# Patient Record
Sex: Male | Born: 1945 | Race: White | Hispanic: No | Marital: Single | Smoking: Never smoker
Health system: Southern US, Community
[De-identification: ages and names within clinical notes are randomized; demographics above are authoritative.]

## PROBLEM LIST (undated history)

## (undated) DIAGNOSIS — C801 Malignant (primary) neoplasm, unspecified: Secondary | ICD-10-CM

## (undated) DIAGNOSIS — E119 Type 2 diabetes mellitus without complications: Secondary | ICD-10-CM

## (undated) DIAGNOSIS — I2510 Atherosclerotic heart disease of native coronary artery without angina pectoris: Secondary | ICD-10-CM

## (undated) DIAGNOSIS — I10 Essential (primary) hypertension: Secondary | ICD-10-CM

## (undated) HISTORY — PX: CORONARY ARTERY BYPASS GRAFT: SHX141

## (undated) HISTORY — PX: SKIN CANCER EXCISION: SHX779

## (undated) HISTORY — PX: EYE SURGERY: SHX253

---

## 2008-01-04 DIAGNOSIS — J309 Allergic rhinitis, unspecified: Secondary | ICD-10-CM | POA: Insufficient documentation

## 2009-01-29 DIAGNOSIS — K219 Gastro-esophageal reflux disease without esophagitis: Secondary | ICD-10-CM | POA: Insufficient documentation

## 2009-02-19 DIAGNOSIS — M109 Gout, unspecified: Secondary | ICD-10-CM | POA: Insufficient documentation

## 2009-03-29 DIAGNOSIS — I10 Essential (primary) hypertension: Secondary | ICD-10-CM | POA: Insufficient documentation

## 2010-12-18 DIAGNOSIS — E119 Type 2 diabetes mellitus without complications: Secondary | ICD-10-CM | POA: Insufficient documentation

## 2015-12-11 DIAGNOSIS — E1169 Type 2 diabetes mellitus with other specified complication: Secondary | ICD-10-CM | POA: Insufficient documentation

## 2015-12-11 DIAGNOSIS — E1121 Type 2 diabetes mellitus with diabetic nephropathy: Secondary | ICD-10-CM | POA: Insufficient documentation

## 2016-12-11 DIAGNOSIS — E790 Hyperuricemia without signs of inflammatory arthritis and tophaceous disease: Secondary | ICD-10-CM | POA: Insufficient documentation

## 2016-12-15 DIAGNOSIS — D510 Vitamin B12 deficiency anemia due to intrinsic factor deficiency: Secondary | ICD-10-CM | POA: Insufficient documentation

## 2017-03-12 DIAGNOSIS — D6949 Other primary thrombocytopenia: Secondary | ICD-10-CM | POA: Insufficient documentation

## 2017-06-17 ENCOUNTER — Other Ambulatory Visit: Payer: Self-pay | Admitting: Orthopedic Surgery

## 2017-06-17 DIAGNOSIS — M5412 Radiculopathy, cervical region: Secondary | ICD-10-CM

## 2017-06-20 ENCOUNTER — Ambulatory Visit
Admission: RE | Admit: 2017-06-20 | Discharge: 2017-06-20 | Disposition: A | Discharge status: Home / Self Care | Payer: Medicare Other | Source: Ambulatory Visit | Attending: Orthopedic Surgery | Admitting: Orthopedic Surgery

## 2017-06-20 DIAGNOSIS — M4802 Spinal stenosis, cervical region: Secondary | ICD-10-CM | POA: Insufficient documentation

## 2017-06-20 DIAGNOSIS — M50223 Other cervical disc displacement at C6-C7 level: Secondary | ICD-10-CM | POA: Diagnosis not present

## 2017-06-20 DIAGNOSIS — M2578 Osteophyte, vertebrae: Secondary | ICD-10-CM | POA: Insufficient documentation

## 2017-06-20 DIAGNOSIS — M5412 Radiculopathy, cervical region: Secondary | ICD-10-CM | POA: Diagnosis present

## 2017-06-26 ENCOUNTER — Other Ambulatory Visit: Payer: Self-pay | Admitting: Orthopedic Surgery

## 2017-06-26 DIAGNOSIS — M5412 Radiculopathy, cervical region: Secondary | ICD-10-CM

## 2017-07-07 ENCOUNTER — Ambulatory Visit
Admission: RE | Admit: 2017-07-07 | Discharge: 2017-07-07 | Disposition: A | Discharge status: Home / Self Care | Payer: Medicare Other | Source: Ambulatory Visit | Attending: Orthopedic Surgery | Admitting: Orthopedic Surgery

## 2017-07-07 DIAGNOSIS — M5412 Radiculopathy, cervical region: Secondary | ICD-10-CM

## 2017-07-07 MED ORDER — TRIAMCINOLONE ACETONIDE 40 MG/ML IJ SUSP (RADIOLOGY)
60.0000 mg | Freq: Once | INTRAMUSCULAR | Status: AC
  Administered 2017-07-07: 60 mg via EPIDURAL

## 2017-07-07 MED ORDER — IOPAMIDOL (ISOVUE-M 300) INJECTION 61%
1.0000 mL | Freq: Once | INTRAMUSCULAR | Status: AC | PRN
  Administered 2017-07-07: 1 mL via EPIDURAL

## 2017-07-07 NOTE — Discharge Instructions (Signed)

## 2017-08-19 ENCOUNTER — Other Ambulatory Visit: Payer: Self-pay | Admitting: Orthopedic Surgery

## 2017-08-19 DIAGNOSIS — M5412 Radiculopathy, cervical region: Secondary | ICD-10-CM

## 2017-08-24 DIAGNOSIS — M5090 Cervical disc disorder, unspecified, unspecified cervical region: Secondary | ICD-10-CM | POA: Insufficient documentation

## 2017-08-25 ENCOUNTER — Ambulatory Visit
Admission: RE | Admit: 2017-08-25 | Discharge: 2017-08-25 | Disposition: A | Discharge status: Home / Self Care | Payer: Medicare Other | Source: Ambulatory Visit | Attending: Orthopedic Surgery | Admitting: Orthopedic Surgery

## 2017-08-25 DIAGNOSIS — M5412 Radiculopathy, cervical region: Secondary | ICD-10-CM

## 2017-08-25 MED ORDER — TRIAMCINOLONE ACETONIDE 40 MG/ML IJ SUSP (RADIOLOGY)
60.0000 mg | Freq: Once | INTRAMUSCULAR | Status: AC
  Administered 2017-08-25: 60 mg via EPIDURAL

## 2017-08-25 MED ORDER — IOPAMIDOL (ISOVUE-M 300) INJECTION 61%
1.0000 mL | Freq: Once | INTRAMUSCULAR | Status: AC | PRN
  Administered 2017-08-25: 1 mL via EPIDURAL

## 2017-09-15 ENCOUNTER — Ambulatory Visit: Payer: Medicare Other | Attending: Orthopedic Surgery | Admitting: Physical Therapy

## 2017-09-15 ENCOUNTER — Other Ambulatory Visit: Payer: Self-pay

## 2017-09-15 ENCOUNTER — Encounter: Payer: Self-pay | Admitting: Physical Therapy

## 2017-09-15 DIAGNOSIS — R293 Abnormal posture: Secondary | ICD-10-CM | POA: Insufficient documentation

## 2017-09-15 DIAGNOSIS — M6281 Muscle weakness (generalized): Secondary | ICD-10-CM | POA: Diagnosis present

## 2017-09-15 DIAGNOSIS — M79601 Pain in right arm: Secondary | ICD-10-CM | POA: Diagnosis present

## 2017-09-15 DIAGNOSIS — M5412 Radiculopathy, cervical region: Secondary | ICD-10-CM

## 2017-09-15 NOTE — Therapy (Signed)
[facility] [address], [place], [address] Phone: [phone]   Fax:  [phone]  Physical Therapy Evaluation  Patient Details  Name: Rickey Sherman MRN: [id number] Date of Birth: 1946/06/07 Referring Provider: Feliberto Gottron, [place]   Encounter Date: 09/15/2017  PT End of Session - 09/15/17 1118    Visit Number  1    Number of Visits  13    Date for PT Re-Evaluation  10/27/17    Authorization Type  G Codes    Authorization Time Period  1/10    PT Start Time  0950    PT Stop Time  1051    PT Time Calculation (min)  61 min    Activity Tolerance  Patient tolerated treatment well;No increased pain    Behavior During Therapy  [facility] for tasks assessed/performed       History reviewed. No pertinent past medical history.  History reviewed. No pertinent surgical history.  There were no vitals filed for this visit.   Subjective Assessment - 09/15/17 1000    Subjective  Cervical Radiculopathy    Pertinent History  Pt is a 71 y/o M who presents with diagnosis of Radiculopathy of Cervical Region.  His symptoms began in June 2018 with pain in his R shoulder, initially believed to be RTC involvement.  Around this time the pt had been hitting 100 balls/day and pt believes this was the reason for his pain. Had injection in shoulder 8/6 with very mild improvement.  Then took 10 day prednisone taper with no improvement.  Symptoms progressed to a deep ache down RUE and intermittent tingling in R forearm and fingers.  Had an MRI on 9/1 with the results documented below.  Pt had cervical epidural steroid injection on 9/18 and again on 11/6.  Following injections pt reports his symptoms improve except for stinging and numbness and effects of injection last ~6 wks.  Pt reports relief with cervical flexion and increase in symptoms when he holds his head up for too long with increase in severity of aching and tingling when  driving longer than 1 hr.  The pt is unable to relate symptoms to any specific activity, his symptoms seem to worsen as the day progresses.  Pt has been sleeping fine, a few night sweats reported which the MD attributed to the epidural.  No changes in bowel or bladder, no changes in his weight.  Pt does not have symptoms when he golfs and pt continues to hit golf balls on warm days.  Pt denies any shoulder or neck injuries or surgeries in the past.  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  Current pain 1/10, best pain 1/10, worst pain 8/10.  No medications on file but pt reports he is taking the following: Losartan, Metformin/Gilpizide, Omeprazole, Aspirin, Allopurinol, Vitamin B12.     Limitations  -- driving, golfing    How long can you sit comfortably?  1 hour into driving    How long can you stand comfortably?  unlimited    How long can you walk comfortably?  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    Patient Stated Goals  Decrease symptoms, golfing without fear of worsening symptoms    Currently in Pain?  Yes    Pain Score  1     Pain Location  Arm    Pain Orientation  Right    Pain Descriptors / Indicators  Aching dull ache    Pain Type  Chronic pain    Pain Onset  More than a month ago    Pain Frequency  Intermittent    Aggravating Factors   Cervical extension, driving more than 1 hour    Pain Relieving Factors  Injections, ice, cervical flexion    Effect of Pain on Daily Activities  pain as day progresses, fear of worsening symptoms with active lifestyle    Multiple Pain Sites  No         [facility] PT Assessment - 09/15/17 1005       Assessment   Medical Diagnosis  Radiculopathy of cervical region    Referring Provider  Feliberto Gottron, PA-C    Onset Date/Surgical Date  04/15/17    Hand Dominance  Right    Next MD Visit  Feb 25th with PCP    Prior Therapy  Yes, with successful treatment of back pain      Precautions   Precautions  None      Restrictions   Weight Bearing Restrictions  No      Balance Screen   Has the patient fallen in the past 6 months  No    Has the patient had a decrease in activity level because of a fear of falling?   No    Is the patient reluctant to leave their home because of a fear of falling?   No      Home Film/video editor residence    Living Arrangements  Other (Comment) Life partner    Available Help at Discharge  Family    Type of [place] Access  Level entry    [place]  One level      Prior Function   Level of [place]  Retired    Engineer, production   Overall Cognitive Status  Within Functional Limits for tasks assessed      ROM / Strength   AROM / PROM / Strength  Strength      Strength   Overall Strength  Deficits    Strength Assessment Site  Shoulder;Elbow;Wrist    Right/Left Shoulder  Left;Right    Right Shoulder Flexion  4+/5    Right Shoulder ABduction  4+/5    Right Shoulder Internal Rotation  4+/5    Right Shoulder External Rotation  4/5    Left Shoulder Flexion  5/5    Left Shoulder ABduction  5/5    Left Shoulder Internal Rotation  5/5    Left Shoulder External Rotation  5/5    Right/Left Elbow  Left;Right    Right Elbow Flexion  4/5    Right Elbow Extension  4/5    Left Elbow Flexion  5/5    Left Elbow Extension  5/5    Right/Left Forearm  --    Right/Left Wrist  Right;Left    Right Wrist Flexion  5/5    Right Wrist Extension  5/5    Left Wrist Flexion  5/5    Left Wrist Extension  5/5  EXAMINATION   NDI: 8%   Palpation: TTP R  infraspinatus with increased muscular tension   Sensation: Light touch WNL BUEs, pt reports tingling in R fingers that is intermittent   Reflexes: Pt unable to completely relax   Posture: significantly forward head and rounded shoulders with thoracic kyphosis   Repeated cervical retraction: improvement in symptoms    Special Tests:  Yergason's: negative   Hawkins-Kennedy: negative   Horizontal Adduction: negative   ULTT (Median): positive   ULTT (Radial): negative   ULTT (Ulnar): negative   Spurlings: improvement in symptoms with both distraction and compression, no change with L or R spurling's   Distraction in supine: pt reports improvement in symptoms     Shoulder AROM (L, R) in deg:  F: 174, 141 (pain in R infraspinatus region)  Abd: 176, 144 pain in R anterior shoulder  ER: 60, 40    Cervical AROM in deg (L, R):  F: 61  E: 28  Rotation: 63, 54  Sidebend: 19, 20     Mobility:  Cervical spine: hypomobility UPAs to R C5-6   Thoracic spine: Hypomobility T1-5     Objective measurements completed on examination: See above findings.    TREATMENT   Educated pt in and demonstrated to pt proper sitting posture which pt then demonstrated back to this therapist. This PT educated pt on the role the posture may play on his symptoms and instructed pt to incorporate improved posture throughout his day.   Repeated seated retraction x15 with improvement in tingling in R fingers (added to HEP)   Median nerve glide x10 in sitting (added to HEP)   Supine manual cervical distraction with 30 second holds x2 with improvement in symptoms           PT Education - 09/15/17 1117    Education provided  Yes    Education Details  Role of PT, POC, examination findings, exercise techinque and provided with HEP    Person(s) Educated  Patient    Methods  Explanation;Demonstration;Verbal cues;Handout    Comprehension  Verbalized understanding;Returned  demonstration;Verbal cues required;Need further instruction       PT Short Term Goals - 09/15/17 1137      PT SHORT TERM GOAL #1   Title  Pt will be independent with HEP for carryover between sessions.    Time  2    Period  Weeks    Status  New        PT Long Term Goals - 09/15/17 1137      PT LONG TERM GOAL #1   Title  Pt will report a decreased worst pain by at least 4 on the NPRS for improved QOL.     Baseline  8/10    Time  4    Status  New      PT LONG TERM GOAL #2   Title  Pt will demonstrate improved posture without cues for decreased symptoms     Baseline  See Evaluation note    Time  2    Period  Weeks    Status  New      PT LONG TERM GOAL #3   Title  Pt will be able to drive longer than 2 hrs without change in symptoms    Baseline  Pt with increased symptoms after driving 1 hr    Time  4    Period  Weeks    Status  New      PT LONG TERM  GOAL #4   Title  Pt will improve RUE strength to at least 5-/5 throughout for improved functional use of RUE    Baseline  See Evaluation note    Time  6    Period  Weeks    Status  New             Plan - 2017/10/02 1152    Clinical Impression Statement  Pt is a 71 y/o M who presents with RUE achiness and RUE tingling with MRI showing cervical foraminal stenosis and disc protrusion.  Upon examination the pt's symptoms improve with cervical retraction as well as manual distraction.  Findings suggestive that pt's poor posture is playing a role in his symptoms and education provided on proper posture to be practiced at home.  R shoulder AROM limited as a result of poor posture as well.  Pt had a positive median nerve ULTT which was addressed with a median nerve glide as part of his HEP.  Pt demonstrates impaired strength RUE as a reflection of radiculopathy, will plan to initiate strengthening program next session.  Pt would like to continue active lifestyle with decreased symptoms and decreased fear of worsening symptoms.  He  will benefit from skilled PT intervention for improved strength and posture and decreased tingling and achiness for improved QOL.      History and Personal Factors relevant to plan of care:  (-) chronicity of symptoms for over 5 months, MRI findings  (+) very motivated to decrease pain and continue with active lifestyle    Clinical Presentation  Stable    Clinical Presentation due to:  Symptoms not currently worsening and are stable at this point    Clinical Decision Making  Low    Rehab Potential  Good    PT Frequency  2x / week    PT Duration  6 weeks    PT Treatment/Interventions  ADLs/Self Care Home Management;Aquatic Therapy;Biofeedback;Cryotherapy;Electrical Stimulation;Iontophoresis 4mg /ml Dexamethasone;Moist Heat;Traction;Ultrasound;Parrafin;Contrast Bath;Fluidtherapy;Gait training;Functional mobility training;Therapeutic activities;Therapeutic exercise;Balance training;Neuromuscular re-education;Patient/family education;Orthotic Fit/Training;Manual techniques;Compression bandaging;Passive range of motion;Dry needling;Energy conservation;Taping    PT Next Visit Plan  Review HEP.  Initiate strengthening program.  Asssess R shoulder joint mobility.  Cervical distraction.      PT Home Exercise Plan  Proper sitting posture, cervical retractions, median nerve glide    Recommended Other Services  none at this time    Consulted and Agree with Plan of Care  Patient       Patient will benefit from skilled therapeutic intervention in order to improve the following deficits and impairments:  Decreased endurance, Decreased mobility, Decreased range of motion, Decreased strength, Hypomobility, Increased fascial restricitons, Impaired perceived functional ability, Impaired flexibility, Impaired sensation, Impaired UE functional use, Improper body mechanics, Postural dysfunction, Pain  Visit Diagnosis: Radiculopathy, cervical region  Pain in right arm  Abnormal posture  Muscle weakness  (generalized)  G-Codes - Oct 02, 2017 1119    Functional Assessment Tool Used (Outpatient Only)  NDI, clinical judgement, MMT    Functional Limitation  Carrying, moving and handling objects    Carrying, Moving and Handling Objects Current Status (W1191)  At least 20 percent but less than 40 percent impaired, limited or restricted    Carrying, Moving and Handling Objects Goal Status (Y7829)  At least 1 percent but less than 20 percent impaired, limited or restricted        Problem List There are no active problems to display for this patient.   Collie Siad PT, DPT 10-02-2017, 12:07 PM  [facility] [address] [place], [address] Phone: [phone]   Fax:  [phone]  Name: KIRAN CARLINE MRN: [id number] Date of Birth: [birth year]

## 2017-09-18 ENCOUNTER — Ambulatory Visit: Payer: Medicare Other | Admitting: Physical Therapy

## 2017-09-21 ENCOUNTER — Ambulatory Visit: Payer: Medicare Other | Attending: Orthopedic Surgery | Admitting: Physical Therapy

## 2017-09-21 DIAGNOSIS — M5412 Radiculopathy, cervical region: Secondary | ICD-10-CM

## 2017-09-21 DIAGNOSIS — R293 Abnormal posture: Secondary | ICD-10-CM | POA: Diagnosis present

## 2017-09-21 DIAGNOSIS — M6281 Muscle weakness (generalized): Secondary | ICD-10-CM | POA: Diagnosis present

## 2017-09-21 DIAGNOSIS — M79601 Pain in right arm: Secondary | ICD-10-CM

## 2017-09-21 NOTE — Therapy (Signed)
[facility] [address] [place], [address] Phone: [phone]   Fax:  [phone]  Physical Therapy Treatment  Patient Details  Name: Rickey Sherman MRN: [id number] Date of Birth: 1946/01/01 Referring Provider: Feliberto Gottron, [place]   Encounter Date: 09/21/2017  PT End of Session - 09/21/17 1250    Visit Number  2    Number of Visits  13    Date for PT Re-Evaluation  10/27/17    Authorization Type  G Codes    Authorization Time Period  2/10    PT Start Time  1030    PT Stop Time  1116    PT Time Calculation (min)  46 min    Activity Tolerance  Patient tolerated treatment well    Behavior During Therapy  [facility] for tasks assessed/performed       No past medical history on file.  No past surgical history on file.  There were no vitals filed for this visit.  Subjective Assessment - 09/21/17 1247    Subjective  Patient reports he has had good resolution of his soreness/achiness from the second epidural on November 6th. He still gets numbness/tingling in his R medial forearm and 4th and 5th fingers. Reports the cervical retractions as part of his HEP reproduced his tingling sensations.     Pertinent History  Pt is a 71 y/o M who presents with diagnosis of Radiculopathy of Cervical Region.  His symptoms began in June 2018 with pain in his R shoulder, initially believed to be RTC involvement.  Around this time the pt had been hitting 100 balls/day and pt believes this was the reason for his pain. Had injection in shoulder 8/6 with very mild improvement.  Then took 10 day prednisone taper with no improvement.  Symptoms progressed to a deep ache down RUE and intermittent tingling in R forearm and fingers.  Had an MRI on 9/1 with the results documented below.  Pt had cervical epidural steroid injection on 9/18 and again on 11/6.  Following injections pt reports his symptoms improve except for stinging and  numbness and effects of injection last ~6 wks.  Pt reports relief with cervical flexion and increase in symptoms when he holds his head up for too long with increase in severity of aching and tingling when driving longer than 1 hr.  The pt is unable to relate symptoms to any specific activity, his symptoms seem to worsen as the day progresses.  Pt has been sleeping fine, a few night sweats reported which the MD attributed to the epidural.  No changes in bowel or bladder, no changes in his weight.  Pt does not have symptoms when he golfs and pt continues to hit golf balls on warm days.  Pt denies any shoulder or neck injuries or surgeries in the past.  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  Current pain 1/10, best pain 1/10, worst pain 8/10.  No medications on file but pt reports he is taking the following: Losartan, Metformin/Gilpizide, Omeprazole, Aspirin, Allopurinol, Vitamin B12.     Limitations  -- driving, golfing    How long can you sit comfortably?  1 hour into driving    How long can you stand comfortably?  unlimited    How long can you walk comfortably?  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    Patient Stated Goals  Decrease symptoms, golfing without fear of worsening symptoms    Currently in Pain?  Other (Comment) Reports very mild ache in posterior shoulder       R grip 86# 81# 87#  L grip 81# 71# 70#  Extension with OP and actively mild reproduction of symptoms into R forearm and hand  Flexion full ROM with no complaints  Rotations and side bending bilaterally no complaints  MMT - IR was painful, otherwise all were 5/5 Pain  with flexion just at the end of the range   PROM ER on R to 84 IR to 73 degrees   CPAs in thoracic spine x 3 bouts x 15-30" grade II-III mobilizations, noted significant resistance to R1, noted discomfort around C5/6 and T2/3, thus focused treatment at these junctions to tolerance. Noted significant reduction in pain with full active flexion.   Passive ER stretching in supine on RUE x 3 bouts x 45-60" per bout, patient reported significant reduction in pain with IR MMT and end range flexion.   Educated patient on completing seated bilateral ER with red t-band x 15 (too easy) thus progressed to green x 12  Educated patient on seated thoracic extensions x 5 with 15" holds on chair to increase thoracic extension (well tolerated)                        PT Education - 09/21/17 1249    Education provided  Yes    Education Details  Updated HEP and explained how thoracic extension loss could contribute to his symptoms.     Person(s) Educated  Patient    Methods  Explanation;Demonstration;Handout;Verbal cues    Comprehension  Verbal cues required;Returned demonstration;Verbalized understanding       PT Short Term Goals - 09/15/17 1137      PT SHORT TERM GOAL #1   Title  Pt will be independent with HEP for carryover between sessions.    Time  2    Period  Weeks    Status  New        PT Long Term Goals - 09/15/17 1137      PT LONG TERM GOAL #1   Title  Pt will report a decreased worst pain by at least 4 on the NPRS for improved QOL.     Baseline  8/10    Time  4    Status  New      PT LONG TERM GOAL #2   Title  Pt will demonstrate improved posture without cues for decreased symptoms     Baseline  See Evaluation note    Time  2    Period  Weeks    Status  New      PT LONG TERM GOAL #3   Title  Pt will be able to drive longer than 2 hrs without change in symptoms    Baseline  Pt with increased symptoms after driving 1 hr    Time  4    Period  Weeks     Status  New      PT LONG TERM GOAL #4   Title  Pt will improve RUE strength to at least 5-/5 throughout for improved functional  use of RUE    Baseline  See Evaluation note    Time  6    Period  Weeks    Status  New            Plan - 09/21/17 1250    Clinical Impression Statement  Patient demonstrates significant loss of thoracic extension, likely demanding excessive cervical extension and contributing to his ongoing symptoms. He reports significant resolution of comparable signs (resisted IR, end range shoulder flexion actively) with passive ER stretching, and thoracic joint mobilizations. He would likely continue to benefit from skilled PT services to address his pain and functional deficits.     Clinical Presentation  Stable    Clinical Decision Making  Moderate    Rehab Potential  Good    PT Frequency  2x / week    PT Duration  6 weeks    PT Treatment/Interventions  ADLs/Self Care Home Management;Aquatic Therapy;Biofeedback;Cryotherapy;Electrical Stimulation;Iontophoresis 4mg /ml Dexamethasone;Moist Heat;Traction;Ultrasound;Parrafin;Contrast Bath;Fluidtherapy;Gait training;Functional mobility training;Therapeutic activities;Therapeutic exercise;Balance training;Neuromuscular re-education;Patient/family education;Orthotic Fit/Training;Manual techniques;Compression bandaging;Passive range of motion;Dry needling;Energy conservation;Taping    PT Next Visit Plan  Review HEP.  Initiate strengthening program.  Asssess R shoulder joint mobility.  Cervical distraction.      PT Home Exercise Plan  Proper sitting posture, cervical retractions, median nerve glide    Consulted and Agree with Plan of Care  Patient       Patient will benefit from skilled therapeutic intervention in order to improve the following deficits and impairments:  Decreased endurance, Decreased mobility, Decreased range of motion, Decreased strength, Hypomobility, Increased fascial restricitons, Impaired perceived functional  ability, Impaired flexibility, Impaired sensation, Impaired UE functional use, Improper body mechanics, Postural dysfunction, Pain  Visit Diagnosis: Radiculopathy, cervical region  Pain in right arm  Muscle weakness (generalized)     Problem List There are no active problems to display for this patient.  Royce Macadamia PT, DPT, CSCS    09/21/2017, 12:53 PM  [facility] [address], [place], [address] Phone: [phone]   Fax:  [phone]  Name: Rickey Sherman MRN: [id number] Date of Birth: [birth year]

## 2017-09-21 NOTE — Patient Instructions (Signed)
R grip 86# 81# 87#  L grip 81# 71# 70#  Extension with OP and actively mild reproduction of symptoms into R forearm and hand  Flexion full ROM with no complaints  Rotations and side bending bilaterally no complaints  MMT - IR was painful, otherwise all were 5/5 Pain with flexion just at the end of the range   PROM ER on R to 84 IR to 73 degrees

## 2017-09-24 ENCOUNTER — Ambulatory Visit: Payer: Medicare Other | Admitting: Physical Therapy

## 2017-09-24 DIAGNOSIS — M5412 Radiculopathy, cervical region: Secondary | ICD-10-CM

## 2017-09-24 DIAGNOSIS — M6281 Muscle weakness (generalized): Secondary | ICD-10-CM

## 2017-09-24 DIAGNOSIS — M79601 Pain in right arm: Secondary | ICD-10-CM

## 2017-09-24 NOTE — Patient Instructions (Signed)
Standing rows x 12 for 3 sets at 20#  Seated rows x 12 for 3 sets at 20#  Chest Press 20# x 15 for 3 sets (a little discomfort in anterior shoulder afterwards)  Cervical manual traction   Low Rows with blue t-band x15 for 4 sets

## 2017-09-25 NOTE — Therapy (Addendum)
[facility] [address], [place], [address] Phone: [phone]   Fax:  [phone]  Physical Therapy Treatment  Patient Details  Name: Rickey Sherman MRN: [id number] Date of Birth: 07/23/1946 Referring Provider: Feliberto Gottron, [place]   Encounter Date: 09/24/2017  PT End of Session - 09/25/17 2247    Visit Number  3    Number of Visits  13    Date for PT Re-Evaluation  10/27/17    Authorization Type  G Codes    Authorization Time Period  3/10    PT Start Time  1515    PT Stop Time  1600    PT Time Calculation (min)  45 min    Activity Tolerance  Patient tolerated treatment well    Behavior During Therapy  [facility] for tasks assessed/performed       No past medical history on file.  No past surgical history on file.  There were no vitals filed for this visit.  Subjective Assessment - 09/24/17 1523    Subjective  Patient reports he has been feeling better still, no complaints after last therapy session. He reports some tingling in his hand this date.     Pertinent History  Pt is a 71 y/o M who presents with diagnosis of Radiculopathy of Cervical Region.  His symptoms began in June 2018 with pain in his R shoulder, initially believed to be RTC involvement.  Around this time the pt had been hitting 100 balls/day and pt believes this was the reason for his pain. Had injection in shoulder 8/6 with very mild improvement.  Then took 10 day prednisone taper with no improvement.  Symptoms progressed to a deep ache down RUE and intermittent tingling in R forearm and fingers.  Had an MRI on 9/1 with the results documented below.  Pt had cervical epidural steroid injection on 9/18 and again on 11/6.  Following injections pt reports his symptoms improve except for stinging and numbness and effects of injection last ~6 wks.  Pt reports relief with cervical flexion and increase in symptoms when he holds his head up for  too long with increase in severity of aching and tingling when driving longer than 1 hr.  The pt is unable to relate symptoms to any specific activity, his symptoms seem to worsen as the day progresses.  Pt has been sleeping fine, a few night sweats reported which the MD attributed to the epidural.  No changes in bowel or bladder, no changes in his weight.  Pt does not have symptoms when he golfs and pt continues to hit golf balls on warm days.  Pt denies any shoulder or neck injuries or surgeries in the past.  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  Current pain 1/10, best pain 1/10, worst pain 8/10.  No medications on file but pt reports he is taking the following: Losartan, Metformin/Gilpizide, Omeprazole, Aspirin, Allopurinol, Vitamin B12.     Limitations  -- driving, golfing    How long can you sit comfortably?  1 hour into driving    How long can you stand comfortably?  unlimited    How long can you walk comfortably?  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    Patient Stated Goals  Decrease symptoms, golfing without fear of worsening symptoms    Currently in Pain?  Yes    Pain Score  -- "Little bit of an ache in his hand"    Pain Location  Arm    Pain Orientation  Right    Pain Descriptors / Indicators  Aching    Pain Type  Chronic pain    Pain Onset  More than a month ago    Pain Frequency  Constant       Standing rows x 12 for 3 sets at 20#  Seated rows x 12 for 3 sets at 20#  Chest Press 20# x 15 for 3 sets (a little discomfort in anterior shoulder afterwards)  Cervical manual traction x5 bouts x 45-60" per bout through tolerable ROM,  patient reported no discomfort with activity and reported no discomfort/tingling in his hand after completion   Low Rows with blue t-band x15 for 4 sets                        PT Education - 09/25/17 2246    Education provided  Yes    Education Details  Will focus on peri-scapular strengthening and manual therapy as needed.     Person(s) Educated  Patient    Methods  Explanation;Demonstration    Comprehension  Verbalized understanding;Returned demonstration       PT Short Term Goals - 09/15/17 1137      PT SHORT TERM GOAL #1   Title  Pt will be independent with HEP for carryover between sessions.    Time  2    Period  Weeks    Status  New        PT Long Term Goals - 09/15/17 1137      PT LONG TERM GOAL #1   Title  Pt will report a decreased worst pain by at least 4 on the NPRS for improved QOL.     Baseline  8/10    Time  4    Status  New      PT LONG TERM GOAL #2   Title  Pt will demonstrate improved posture without cues for decreased symptoms     Baseline  See Evaluation note    Time  2    Period  Weeks    Status  New      PT LONG TERM GOAL #3   Title  Pt will be able to drive longer than 2 hrs without change in symptoms    Baseline  Pt with increased symptoms after driving 1 hr    Time  4    Period  Weeks    Status  New      PT LONG TERM GOAL #4   Title  Pt will improve RUE strength to at least 5-/5 throughout for improved functional use of RUE    Baseline  See Evaluation note    Time  6    Period  Weeks    Status  New            Plan - 09/25/17 2247    Clinical Impression Statement  Patient had poor response to anterior chain strengthening and appeared to respond much better to cervical traction manually as well as posterior chain strengthening. He denied any radicular symptoms after completion of manual  therapy intervention.     Clinical Presentation  Stable    Clinical Decision Making  Moderate    Rehab Potential  Good    PT  Frequency  2x / week    PT Duration  6 weeks    PT Treatment/Interventions  ADLs/Self Care Home Management;Aquatic Therapy;Biofeedback;Cryotherapy;Electrical Stimulation;Iontophoresis 4mg /ml Dexamethasone;Moist Heat;Traction;Ultrasound;Parrafin;Contrast Bath;Fluidtherapy;Gait training;Functional mobility training;Therapeutic activities;Therapeutic exercise;Balance training;Neuromuscular re-education;Patient/family education;Orthotic Fit/Training;Manual techniques;Compression bandaging;Passive range of motion;Dry needling;Energy conservation;Taping    PT Next Visit Plan  Review HEP.  Initiate strengthening program.  Asssess R shoulder joint mobility.  Cervical distraction.      PT Home Exercise Plan  Proper sitting posture, cervical retractions, median nerve glide    Consulted and Agree with Plan of Care  Patient       Patient will benefit from skilled therapeutic intervention in order to improve the following deficits and impairments:  Decreased endurance, Decreased mobility, Decreased range of motion, Decreased strength, Hypomobility, Increased fascial restricitons, Impaired perceived functional ability, Impaired flexibility, Impaired sensation, Impaired UE functional use, Improper body mechanics, Postural dysfunction, Pain  Visit Diagnosis: Radiculopathy, cervical region  Pain in right arm  Muscle weakness (generalized)     Problem List There are no active problems to display for this patient.  Royce Macadamia PT, DPT, CSCS   Late addendum - exercise modifications   09/25/2017, 10:50 PM  [facility] [address], [place], [address] Phone: [phone]   Fax:  [phone]  Name: WILBERTO CONSOLE MRN: [id number] Date of Birth: 08-24-1946

## 2017-09-28 ENCOUNTER — Ambulatory Visit: Payer: Medicare Other | Admitting: Physical Therapy

## 2017-10-01 ENCOUNTER — Ambulatory Visit: Payer: Medicare Other | Admitting: Physical Therapy

## 2017-10-01 DIAGNOSIS — M79601 Pain in right arm: Secondary | ICD-10-CM

## 2017-10-01 DIAGNOSIS — M5412 Radiculopathy, cervical region: Secondary | ICD-10-CM

## 2017-10-01 DIAGNOSIS — M6281 Muscle weakness (generalized): Secondary | ICD-10-CM

## 2017-10-04 NOTE — Therapy (Signed)
[facility] [address] [place], [address] Phone: [phone]   Fax:  [phone]  Physical Therapy Treatment  Patient Details  Name: Rickey Sherman DOBOSZ MRN: [id number] Date of Birth: Nov 18, 1945 Referring Provider: Feliberto Gottron, [place]   Encounter Date: 10/01/2017  PT End of Session - 10/04/17 2328    Visit Number  4    Number of Visits  13    Date for PT Re-Evaluation  10/27/17    Authorization Type  G Codes    Authorization Time Period  4/10    PT Start Time  1415    PT Stop Time  1500    PT Time Calculation (min)  45 min    Activity Tolerance  Patient tolerated treatment well    Behavior During Therapy  [facility] for tasks assessed/performed       No past medical history on file.  No past surgical history on file.  There were no vitals filed for this visit.  Subjective Assessment - 10/04/17 2325    Subjective  Paient reports he has been noticeably better since having the injection and beginning therapy. He still gets some radiating pain through his R arm, especially while driving. He found some of the nerve glides/sliders to be beneficial in his initial HEP.     Pertinent History  Pt is a 71 y/o M who presents with diagnosis of Radiculopathy of Cervical Region.  His symptoms began in June 2018 with pain in his R shoulder, initially believed to be RTC involvement.  Around this time the pt had been hitting 100 balls/day and pt believes this was the reason for his pain. Had injection in shoulder 8/6 with very mild improvement.  Then took 10 day prednisone taper with no improvement.  Symptoms progressed to a deep ache down RUE and intermittent tingling in R forearm and fingers.  Had an MRI on 9/1 with the results documented below.  Pt had cervical epidural steroid injection on 9/18 and again on 11/6.  Following injections pt reports his symptoms improve except for stinging and numbness and effects of  injection last ~6 wks.  Pt reports relief with cervical flexion and increase in symptoms when he holds his head up for too long with increase in severity of aching and tingling when driving longer than 1 hr.  The pt is unable to relate symptoms to any specific activity, his symptoms seem to worsen as the day progresses.  Pt has been sleeping fine, a few night sweats reported which the MD attributed to the epidural.  No changes in bowel or bladder, no changes in his weight.  Pt does not have symptoms when he golfs and pt continues to hit golf balls on warm days.  Pt denies any shoulder or neck injuries or surgeries in the past.  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  Current pain 1/10, best pain 1/10, worst pain 8/10.  No medications on file but pt reports he is taking the following: Losartan, Metformin/Gilpizide, Omeprazole, Aspirin, Allopurinol, Vitamin B12.     Limitations  -- driving, golfing    How long can you sit comfortably?  1 hour into driving    How long can you stand comfortably?  unlimited    How long can you walk comfortably?  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    Patient Stated Goals  Decrease symptoms, golfing without fear of worsening symptoms    Currently in Pain?  No/denies      Educated patient both verbally and with video feedback for rationale, and technique in completing both ulnar nerve sliders as well as median nerve sliders (both of which have been added to his HEP) Performed x 15 repetitions of both on his RUE, with cuing for appropriate technique of each   Educated and observed patient on completion of seated thoracic  extensions to target upper thoracic/cervical spine as joint mobilizations are not an option for HEP   Performed manual cervical traction, then combined with L side bend to "open" R facets for 30-45" bouts x 5 bouts, well tolerated by patient. Educated patient on self traction with a towel in neutral cervical orientation   Performed CPAs on cervical and thoracic spine, as well as UPAs on R side. Notable tender point around C7/T1 with reproduction of symptoms, alternated between grade 1-3 mobilizations as tolerated for a total of 5 bouts of ~30" per bout, well tolerated with no residual symptoms noted after completion.                         PT Education - 10/04/17 2327    Education provided  Yes    Education Details  Provided HEP to focus on nerve mobilization as well as targeting of identified "hot spot" in upper thoracic/cervical spine.     Person(s) Educated  Patient    Methods  Explanation;Demonstration;Handout    Comprehension  Returned demonstration;Verbalized understanding       PT Short Term Goals - 09/15/17 1137      PT SHORT TERM GOAL #1   Title  Pt will be independent with HEP for carryover between sessions.    Time  2    Period  Weeks    Status  New        PT Long Term Goals - 09/15/17 1137      PT LONG TERM GOAL #1   Title  Pt will report a decreased worst pain by at least 4 on the NPRS for improved QOL.     Baseline  8/10    Time  4    Status  New      PT LONG TERM GOAL #2   Title  Pt will demonstrate improved posture without cues for decreased symptoms     Baseline  See Evaluation note    Time  2    Period  Weeks    Status  New      PT LONG TERM GOAL #3   Title  Pt will be able to drive longer than 2 hrs without change in symptoms    Baseline  Pt with increased symptoms after driving 1 hr    Time  4    Period  Weeks    Status  New      PT LONG TERM GOAL #4   Title  Pt will improve RUE strength to at least 5-/5 throughout for  improved functional use of RUE    Baseline  See Evaluation note    Time  6    Period  Weeks    Status  New  Plan - 10/04/17 2329    Clinical Impression Statement  Patient continues to have symptoms of pain/radiating tingling while driving, though improved from baseline. Of note with manual therapy applied, C7/T1 is quite tender and reproduces his symptoms down the arm, thus focused manual treatment on this area. He has previously found some benefit from nerve glides/sliders which were re-introduced to his HEP in this session, as he could certainly have some decrease in extensibility or ease of deformation of the ulnar nerve/lower cervical nerve roots which would be consistent with his presentation. Will continue to focus treatment on C7/T1 CPAs/UPAs and ulnar nerve mobility.     Clinical Presentation  Stable    Clinical Decision Making  Moderate    Rehab Potential  Good    PT Frequency  2x / week    PT Duration  6 weeks    PT Treatment/Interventions  ADLs/Self Care Home Management;Aquatic Therapy;Biofeedback;Cryotherapy;Electrical Stimulation;Iontophoresis 4mg /ml Dexamethasone;Moist Heat;Traction;Ultrasound;Parrafin;Contrast Bath;Fluidtherapy;Gait training;Functional mobility training;Therapeutic activities;Therapeutic exercise;Balance training;Neuromuscular re-education;Patient/family education;Orthotic Fit/Training;Manual techniques;Compression bandaging;Passive range of motion;Dry needling;Energy conservation;Taping    PT Next Visit Plan  Review HEP.  Initiate strengthening program.  Asssess R shoulder joint mobility.  Cervical distraction.      PT Home Exercise Plan  Proper sitting posture, cervical retractions, median nerve glide    Consulted and Agree with Plan of Care  Patient       Patient will benefit from skilled therapeutic intervention in order to improve the following deficits and impairments:  Decreased endurance, Decreased mobility, Decreased range of motion,  Decreased strength, Hypomobility, Increased fascial restricitons, Impaired perceived functional ability, Impaired flexibility, Impaired sensation, Impaired UE functional use, Improper body mechanics, Postural dysfunction, Pain  Visit Diagnosis: Radiculopathy, cervical region  Pain in right arm  Muscle weakness (generalized)     Problem List There are no active problems to display for this patient.  Royce Macadamia PT, DPT, CSCS    10/04/2017, 11:35 PM  [facility] [address], [place], [address] Phone: [phone]   Fax:  [phone]  Name: JOHANAN SKORUPSKI MRN: [id number] Date of Birth: 08-20-1946

## 2017-10-05 ENCOUNTER — Other Ambulatory Visit: Payer: Self-pay

## 2017-10-05 ENCOUNTER — Encounter: Payer: Self-pay | Admitting: Physical Therapy

## 2017-10-05 ENCOUNTER — Ambulatory Visit: Payer: Medicare Other | Admitting: Physical Therapy

## 2017-10-05 DIAGNOSIS — M5412 Radiculopathy, cervical region: Secondary | ICD-10-CM

## 2017-10-05 DIAGNOSIS — R293 Abnormal posture: Secondary | ICD-10-CM

## 2017-10-05 DIAGNOSIS — M79601 Pain in right arm: Secondary | ICD-10-CM

## 2017-10-05 DIAGNOSIS — M6281 Muscle weakness (generalized): Secondary | ICD-10-CM

## 2017-10-05 NOTE — Therapy (Signed)
[facility] [address], [place], [address] Phone: [phone]   Fax:  [phone]  Physical Therapy Treatment  Patient Details  Name: Rickey Sherman MRN: [id number] Date of Birth: [birth year] Referring Provider: Feliberto Gottron, [place]   Encounter Date: 10/05/2017  PT End of Session - 10/05/17 1348    Visit Number  5    Number of Visits  13    Date for PT Re-Evaluation  10/27/17    Authorization Type  G Codes    Authorization Time Period  5/10    PT Start Time  1348    PT Stop Time  1429    PT Time Calculation (min)  41 min    Activity Tolerance  Patient tolerated treatment well    Behavior During Therapy  [facility] for tasks assessed/performed       History reviewed. No pertinent past medical history.  History reviewed. No pertinent surgical history.  There were no vitals filed for this visit.  Subjective Assessment - 10/05/17 1349    Subjective  Pt reports his symptoms have intensitifed since yesterday.  He did not sleep well last night.  Is wondering if his nerve glides could have caused this flare up.  Pt performed nerve glides 15-20 reps, 4-5x/day.  Pt is planning to play golf tomorrow and is not expecting any issues.     Pertinent History  Pt is a 71 y/o M who presents with diagnosis of Radiculopathy of Cervical Region.  His symptoms began in June 2018 with pain in his R shoulder, initially believed to be RTC involvement.  Around this time the pt had been hitting 100 balls/day and pt believes this was the reason for his pain. Had injection in shoulder 8/6 with very mild improvement.  Then took 10 day prednisone taper with no improvement.  Symptoms progressed to a deep ache down RUE and intermittent tingling in R forearm and fingers.  Had an MRI on 9/1 with the results documented below.  Pt had cervical epidural steroid injection on 9/18 and again on 11/6.  Following injections pt reports his symptoms  improve except for stinging and numbness and effects of injection last ~6 wks.  Pt reports relief with cervical flexion and increase in symptoms when he holds his head up for too long with increase in severity of aching and tingling when driving longer than 1 hr.  The pt is unable to relate symptoms to any specific activity, his symptoms seem to worsen as the day progresses.  Pt has been sleeping fine, a few night sweats reported which the MD attributed to the epidural.  No changes in bowel or bladder, no changes in his weight.  Pt does not have symptoms when he golfs and pt continues to hit golf balls on warm days.  Pt denies any shoulder or neck injuries or surgeries in the past.  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  Current pain 1/10, best pain 1/10, worst pain 8/10.  No medications on file but pt reports he is taking the following: Losartan, Metformin/Gilpizide, Omeprazole, Aspirin, Allopurinol, Vitamin B12.     Limitations  -- driving, golfing    How long can you sit comfortably?  1 hour into driving    How long can you stand comfortably?  unlimited    How long can you walk comfortably?  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    Patient Stated Goals  Decrease symptoms, golfing without fear of worsening symptoms    Currently in Pain?  Yes    Pain Score  1     Pain Location  Hand forearm    Pain Orientation  Right    Pain Descriptors / Indicators  Aching stinging        TREATMENT:   Ulnar and median nerve glides x10 each on RUE (instructed pt to only perform 10 reps each and only 2-3 x/day). Pt reports increase in R hand/finger tingling with  ulnar nerve glide. Instructed pt not to move so far into stress position when performing.   Performed manual cervical traction, then combined with L side bend to "open" R facets for 30-45" bouts x 5 bouts each direction, well tolerated by patient with no symptoms while performing.   Performed CPAs on cervical and thoracic spine, as well as UPAs on R side. Grade 3 mobilizations for a total of 2 bouts of ~30" per bout at each level.   Cervical retractions into pillow x15 in supine   Supine pec stretch Bil with towel roll along thoracic spine to promote thoracic extension x2 minutes. Pt reports a moderate to heavy stretch in Bil pecs but without discomfort.       PT Education - 10/05/17 1348    Education provided  Yes    Education Details  Exercise technique; reasoning behind interventions; instructed pt to perform nerve glides only 10 reps at a time and only 2-3 sets per day.     Person(s) Educated  Patient    Methods  Explanation;Demonstration;Verbal cues    Comprehension  Verbalized understanding;Returned demonstration;Verbal cues required;Need further instruction       PT Short Term Goals - 09/15/17 1137      PT SHORT TERM GOAL #1   Title  Pt will be independent with HEP for carryover between sessions.    Time  2    Period  Weeks    Status  New        PT Long Term Goals - 09/15/17 1137      PT LONG TERM GOAL #1   Title  Pt will report a decreased worst pain by at least 4 on the NPRS for improved QOL.     Baseline  8/10    Time  4    Status  New      PT LONG TERM GOAL #2   Title  Pt will demonstrate improved posture without cues for decreased symptoms     Baseline  See Evaluation note    Time  2    Period  Weeks    Status  New      PT LONG TERM GOAL #3   Title  Pt will be able to drive longer than 2 hrs without change in symptoms    Baseline  Pt with increased symptoms after driving 1 hr    Time  4    Period  Weeks    Status  New      PT LONG TERM GOAL #4    Title  Pt will improve RUE strength to at least 5-/5 throughout for improved functional use of RUE  Baseline  See Evaluation note    Time  6    Period  Weeks    Status  New            Plan - 10/05/17 1430    Clinical Impression Statement  Pt presented with increase in stinging sensation in R hand and fingers.  Pt reports he has been completing both median and ulnar nerve gliding 15-20 reps 4-5 x/day which is likely contributing to increase in symptoms due to sensitivity in pt's neural system.  Instructed pt to decrease reps and sets and with ulnar nerve glide encouraged pt to not move into stress position quite as much.  Pt responded well to manual cervical traction and joint mobilizations with a reports of no symptoms with manual interventions.  Pt will benefit from continued skilled PT intervention for improvement in symptoms and improved QOL.     Rehab Potential  Good    PT Frequency  2x / week    PT Duration  6 weeks    PT Treatment/Interventions  ADLs/Self Care Home Management;Aquatic Therapy;Biofeedback;Cryotherapy;Electrical Stimulation;Iontophoresis 4mg /ml Dexamethasone;Moist Heat;Traction;Ultrasound;Parrafin;Contrast Bath;Fluidtherapy;Gait training;Functional mobility training;Therapeutic activities;Therapeutic exercise;Balance training;Neuromuscular re-education;Patient/family education;Orthotic Fit/Training;Manual techniques;Compression bandaging;Passive range of motion;Dry needling;Energy conservation;Taping    PT Next Visit Plan  Review HEP.  Initiate strengthening program.  Asssess R shoulder joint mobility.  Cervical distraction.      PT Home Exercise Plan  Proper sitting posture, cervical retractions, median nerve glide    Consulted and Agree with Plan of Care  Patient       Patient will benefit from skilled therapeutic intervention in order to improve the following deficits and impairments:  Decreased endurance, Decreased mobility, Decreased range of motion, Decreased  strength, Hypomobility, Increased fascial restricitons, Impaired perceived functional ability, Impaired flexibility, Impaired sensation, Impaired UE functional use, Improper body mechanics, Postural dysfunction, Pain  Visit Diagnosis: Radiculopathy, cervical region  Pain in right arm  Muscle weakness (generalized)  Abnormal posture     Problem List There are no active problems to display for this patient.   Collie Siad PT, DPT 10/05/2017, 2:32 PM  [facility] [address] [place], [address] Phone: [phone]   Fax:  [phone]  Name: Rickey Sherman MRN: [id number] Date of Birth: [birth year]

## 2017-10-08 ENCOUNTER — Other Ambulatory Visit: Payer: Self-pay

## 2017-10-08 ENCOUNTER — Ambulatory Visit: Payer: Medicare Other | Admitting: Physical Therapy

## 2017-10-08 ENCOUNTER — Encounter: Payer: Self-pay | Admitting: Physical Therapy

## 2017-10-08 DIAGNOSIS — R293 Abnormal posture: Secondary | ICD-10-CM

## 2017-10-08 DIAGNOSIS — M79601 Pain in right arm: Secondary | ICD-10-CM

## 2017-10-08 DIAGNOSIS — M5412 Radiculopathy, cervical region: Secondary | ICD-10-CM | POA: Diagnosis not present

## 2017-10-08 DIAGNOSIS — M6281 Muscle weakness (generalized): Secondary | ICD-10-CM

## 2017-10-08 NOTE — Therapy (Signed)
[facility] [address] [place], [address] Phone: [phone]   Fax:  [phone]  Physical Therapy Treatment  Patient Details  Name: Rickey Sherman MRN: [id number] Date of Birth: Apr 26, 1946 Referring Provider: Feliberto Gottron, [place]   Encounter Date: 10/08/2017  PT End of Session - 10/08/17 0944    Visit Number  6    Number of Visits  13    Date for PT Re-Evaluation  10/27/17    Authorization Type  G Codes    Authorization Time Period  6/10    PT Start Time  0945    PT Stop Time  1025    PT Time Calculation (min)  40 min    Activity Tolerance  Patient tolerated treatment well    Behavior During Therapy  [facility] for tasks assessed/performed       History reviewed. No pertinent past medical history.  History reviewed. No pertinent surgical history.  There were no vitals filed for this visit.  Subjective Assessment - 10/08/17 0946    Subjective  Pt reports his symptoms are feeling a little better since last session.  He denies any symptom increase with neural glides.  Pt has some tingling when he is driving.  Pt typically drives with L hand, allowing RUE to rest on armrest.      Pertinent History  Pt is a 71 y/o M who presents with diagnosis of Radiculopathy of Cervical Region.  His symptoms began in June 2018 with pain in his R shoulder, initially believed to be RTC involvement.  Around this time the pt had been hitting 100 balls/day and pt believes this was the reason for his pain. Had injection in shoulder 8/6 with very mild improvement.  Then took 10 day prednisone taper with no improvement.  Symptoms progressed to a deep ache down RUE and intermittent tingling in R forearm and fingers.  Had an MRI on 9/1 with the results documented below.  Pt had cervical epidural steroid injection on 9/18 and again on 11/6.  Following injections pt reports his symptoms improve except for stinging and numbness and  effects of injection last ~6 wks.  Pt reports relief with cervical flexion and increase in symptoms when he holds his head up for too long with increase in severity of aching and tingling when driving longer than 1 hr.  The pt is unable to relate symptoms to any specific activity, his symptoms seem to worsen as the day progresses.  Pt has been sleeping fine, a few night sweats reported which the MD attributed to the epidural.  No changes in bowel or bladder, no changes in his weight.  Pt does not have symptoms when he golfs and pt continues to hit golf balls on warm days.  Pt denies any shoulder or neck injuries or surgeries in the past.  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  Current pain 1/10, best pain 1/10, worst pain 8/10.  No medications on file but pt reports he is taking the following: Losartan, Metformin/Gilpizide, Omeprazole, Aspirin, Allopurinol, Vitamin B12.     Limitations  -- driving, golfing    How long can you sit comfortably?  1 hour into driving    How long can you stand comfortably?  unlimited    How long can you walk comfortably?  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    Patient Stated Goals  Decrease symptoms, golfing without fear of worsening symptoms    Currently in Pain?  No/denies    Multiple Pain Sites  No       TREATMENT:   Slight tingling in R hand/fingers at start of sessoin   Performed manual cervical traction, then combined with L side bend to "open" R facets for 30-45" bouts x 5 bouts each direction, well tolerated by patient   L sided graded III-IV mobilization to "open" R side for 30-45" x3 C2-7   Performed CPAs  on cervical and thoracic spine, as well as UPAs on R side. Grade 3 mobilizations for a total of 2 bouts of ~30" per bout at each level.   Cervical retractions into pillow x15 in supine   Supine pec stretch Bil with towel roll along thoracic spine to promote thoracic extension x2 minutes. Pt reports a moderate to heavy stretch in Bil pecs but without discomfort.   Standing scapular retractions at [facility] with #20 3x15. Cues during last set for cervical retraction as pt with significant forward head posture without cues.   Standing lat pulldowns at [facility] with #35 2x15, x10 last set due to fatigue with cues to imagine tucking shoulder blades into back pockets   Posture at wall with cervical retraction and core activation   No tingling in R hand/fingers at end of session        PT Education - 10/08/17 0944    Education provided  Yes    Education Details  Exercise technique    Person(s) Educated  Patient    Methods  Explanation;Demonstration;Verbal cues    Comprehension  Verbalized understanding;Returned demonstration;Verbal cues required;Need further instruction       PT Short Term Goals - 09/15/17 1137      PT SHORT TERM GOAL #1   Title  Pt will be independent with HEP for carryover between sessions.    Time  2    Period  Weeks    Status  New        PT Long Term Goals - 09/15/17 1137      PT LONG TERM GOAL #1   Title  Pt will report a decreased worst pain by at least 4 on the NPRS for improved QOL.     Baseline  8/10    Time  4    Status  New      PT LONG TERM GOAL #2   Title  Pt will demonstrate improved posture without cues for decreased symptoms     Baseline  See Evaluation note    Time  2    Period  Weeks    Status  New      PT LONG TERM GOAL #3   Title  Pt will be able to drive longer than 2 hrs without change in symptoms    Baseline  Pt with increased symptoms after driving 1 hr    Time  4    Period  Weeks    Status  New      PT LONG TERM GOAL #4    Title  Pt will improve RUE strength to at least 5-/5 throughout for improved functional use of RUE    Baseline  See Evaluation  note    Time  6    Period  Weeks    Status  New            Plan - 10/08/17 1009    Clinical Impression Statement  Pt reports improvement in symptoms since last session although he does have some slight tingling in R hand/fingers at start of session.  He has been completing his HEP without increase in symptoms and without questions or concerns.  Introduced lateral joint mobs in cervical spine this session to further facilitate opening on R side.  Progressed strengthening exercises and emphasized the role of posture in pt's symptoms with pt verbalizing understanding.  Pt reports no tingling in R hand/fingers at end of session.  Pt will benefit from continued skilled PT interventions for decreased symptoms and improved QOL.     Rehab Potential  Good    PT Frequency  2x / week    PT Duration  6 weeks    PT Treatment/Interventions  ADLs/Self Care Home Management;Aquatic Therapy;Biofeedback;Cryotherapy;Electrical Stimulation;Iontophoresis 4mg /ml Dexamethasone;Moist Heat;Traction;Ultrasound;Parrafin;Contrast Bath;Fluidtherapy;Gait training;Functional mobility training;Therapeutic activities;Therapeutic exercise;Balance training;Neuromuscular re-education;Patient/family education;Orthotic Fit/Training;Manual techniques;Compression bandaging;Passive range of motion;Dry needling;Energy conservation;Taping    PT Next Visit Plan  Review HEP.  Initiate strengthening program.  Asssess R shoulder joint mobility.  Cervical distraction.      PT Home Exercise Plan  Proper sitting posture, cervical retractions, median nerve glide    Consulted and Agree with Plan of Care  Patient       Patient will benefit from skilled therapeutic intervention in order to improve the following deficits and impairments:  Decreased endurance, Decreased mobility, Decreased range of motion, Decreased  strength, Hypomobility, Increased fascial restricitons, Impaired perceived functional ability, Impaired flexibility, Impaired sensation, Impaired UE functional use, Improper body mechanics, Postural dysfunction, Pain  Visit Diagnosis: Radiculopathy, cervical region  Pain in right arm  Muscle weakness (generalized)  Abnormal posture     Problem List There are no active problems to display for this patient.    Collie Siad PT, DPT 10/08/2017, 10:27 AM  [facility] [address], [place], [address] Phone: [phone]   Fax:  [phone]  Name: Rickey Sherman MRN: [id number] Date of Birth: [birth year]

## 2017-10-12 ENCOUNTER — Ambulatory Visit: Payer: Medicare Other | Admitting: Physical Therapy

## 2017-10-15 ENCOUNTER — Ambulatory Visit: Payer: Medicare Other | Admitting: Physical Therapy

## 2017-10-19 ENCOUNTER — Ambulatory Visit: Payer: Medicare Other | Admitting: Physical Therapy

## 2017-10-19 DIAGNOSIS — M5412 Radiculopathy, cervical region: Secondary | ICD-10-CM | POA: Diagnosis not present

## 2017-10-19 DIAGNOSIS — R293 Abnormal posture: Secondary | ICD-10-CM

## 2017-10-19 DIAGNOSIS — M79601 Pain in right arm: Secondary | ICD-10-CM

## 2017-10-19 DIAGNOSIS — M6281 Muscle weakness (generalized): Secondary | ICD-10-CM

## 2017-10-19 NOTE — Therapy (Signed)
[facility] [address], [place], [address] Phone: [phone]   Fax:  [phone]  Physical Therapy Treatment  Patient Details  Name: Rickey Sherman MRN: [id number] Date of Birth: [birth year] Referring Provider: Feliberto Gottron, [place]   Encounter Date: 10/19/2017  PT End of Session - 10/19/17 1311    Visit Number  7    Number of Visits  13    Date for PT Re-Evaluation  10/27/17    Authorization Type  G Codes    Authorization Time Period  6/10    PT Start Time  1118    PT Stop Time  1157    PT Time Calculation (min)  39 min    Activity Tolerance  Patient tolerated treatment well    Behavior During Therapy  [facility] for tasks assessed/performed       No past medical history on file.  No past surgical history on file.  There were no vitals filed for this visit.  Subjective Assessment - 10/19/17 1305    Subjective  Pt reports some mild tingling in R UE today upon arrival.  He states he has been compliant with HEP.  He states he's missed a couple of PT visits due to a "bad cold".    Pertinent History  Pt is a 71 y/o M who presents with diagnosis of Radiculopathy of Cervical Region.  His symptoms began in June 2018 with pain in his R shoulder, initially believed to be RTC involvement.  Around this time the pt had been hitting 100 balls/day and pt believes this was the reason for his pain. Had injection in shoulder 8/6 with very mild improvement.  Then took 10 day prednisone taper with no improvement.  Symptoms progressed to a deep ache down RUE and intermittent tingling in R forearm and fingers.  Had an MRI on 9/1 with the results documented below.  Pt had cervical epidural steroid injection on 9/18 and again on 11/6.  Following injections pt reports his symptoms improve except for stinging and numbness and effects of injection last ~6 wks.  Pt reports relief with cervical flexion and increase in symptoms when  he holds his head up for too long with increase in severity of aching and tingling when driving longer than 1 hr.  The pt is unable to relate symptoms to any specific activity, his symptoms seem to worsen as the day progresses.  Pt has been sleeping fine, a few night sweats reported which the MD attributed to the epidural.  No changes in bowel or bladder, no changes in his weight.  Pt does not have symptoms when he golfs and pt continues to hit golf balls on warm days.  Pt denies any shoulder or neck injuries or surgeries in the past.  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  Current pain 1/10, best pain 1/10, worst pain 8/10.  No medications on file but pt reports he is taking the following: Losartan, Metformin/Gilpizide, Omeprazole, Aspirin, Allopurinol, Vitamin B12.     Limitations  -- driving, golfing    How long can you sit comfortably?  1 hour into driving    How long can you stand comfortably?  unlimited    How long can you walk comfortably?  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    Patient Stated Goals  Decrease symptoms, golfing without fear of worsening symptoms    Currently in Pain?  No/denies    Pain Onset  More than a month ago    Multiple Pain Sites  No         Treatment:  Supine manual therapy:  Suboccipital release with manual R U/T and Lev Scap mm. Stretches 5x20" each; Lateral glides to L C4-C7 with manual sidebend to "open" R lower cervical facet joints gr. 3/4. PA glides to C4-C7 gr. 2/3.  STM to R U/T and lev scap.  Supine chin tucks 20x with verbal cueing for correct technique.  Lat pull downs B with chin tuck and ab bracing 20x  with blue tband; Rows with GTB 20x; stdg B shoulder ER with postural cueing with GTB 20x; standing B shoulder flexion with back against wall with chin tuck with ab bracing and postural cueing 10x; upper limb neural stretches against wall 2x20 seconds each way.                     PT Education - 10/19/17 1307    Education provided  Yes    Education Details  Reviewed correct form with cervical retraction/chin tuck today.  Added standing wall B shoulder flexion with cervical retraction, as well as self neural tension glides against the wall to HEP today.    Person(s) Educated  Patient    Methods  Explanation;Demonstration;Tactile cues;Verbal cues    Comprehension  Verbalized understanding;Returned demonstration;Verbal cues required       PT Short Term Goals - 10/19/17 1321      PT SHORT TERM GOAL #1   Title  Pt will be independent with HEP for carryover between sessions.    Time  2    Period  Weeks    Status  New        PT Long Term Goals - 10/19/17 1321      PT LONG TERM GOAL #1   Title  Pt will report a decreased worst pain by at least 4 on the NPRS for improved QOL.     Baseline  8/10    Time  4    Status  New      PT LONG TERM GOAL #2   Title  Pt will demonstrate improved posture without cues for decreased symptoms     Baseline  See Evaluation note    Time  2    Period  Weeks    Status  New      PT LONG TERM GOAL #3   Title  Pt will be able to drive longer than 2 hrs without change in symptoms    Baseline  Pt with increased symptoms after driving 1 hr    Time  4    Period  Weeks    Status  New      PT LONG TERM GOAL #4   Title  Pt will improve RUE strength to at least 5-/5 throughout for improved functional use of RUE    Baseline  See Evaluation note    Time  6    Period  Weeks    Status  New            Plan - 10/19/17 1314    Clinical Impression Statement  Pt found wall postural exercise with B shoulder stretch into flexion and neural  glides against wall to be helpful today.  He has one more scheduled PT visit, but states that he has 2 he missed due to a cold.    Clinical Presentation  Stable    Clinical Decision Making  Moderate    Rehab Potential  Good    PT Frequency  2x / week    PT Duration  6 weeks    PT Treatment/Interventions  ADLs/Self Care Home Management;Aquatic Therapy;Biofeedback;Cryotherapy;Electrical Stimulation;Iontophoresis 4mg /ml Dexamethasone;Moist Heat;Traction;Ultrasound;Parrafin;Contrast Bath;Fluidtherapy;Gait training;Functional mobility training;Therapeutic activities;Therapeutic exercise;Balance training;Neuromuscular re-education;Patient/family education;Orthotic Fit/Training;Manual techniques;Compression bandaging;Passive range of motion;Dry needling;Energy conservation;Taping    PT Next Visit Plan  Review HEP.  Initiate strengthening program.  Asssess R shoulder joint mobility.  Cervical distraction.      PT Home Exercise Plan  Proper sitting posture, cervical retractions, median nerve glide    Consulted and Agree with Plan of Care  Patient       Patient will benefit from skilled therapeutic intervention in order to improve the following deficits and impairments:  Decreased endurance, Decreased mobility, Decreased range of motion, Decreased strength, Hypomobility, Increased fascial restricitons, Impaired perceived functional ability, Impaired flexibility, Impaired sensation, Impaired UE functional use, Improper body mechanics, Postural dysfunction, Pain  Visit Diagnosis: Radiculopathy, cervical region  Muscle weakness (generalized)  Pain in right arm  Abnormal posture     Problem List There are no active problems to display for this patient.   Sosaia Pittinger, MPT 10/19/2017, 1:23 PM  [facility] [address] [place], [address] Phone: [phone]   Fax:  [phone]  Name: Rickey Sherman MRN: [id number] Date  of Birth: 20-May-1946

## 2017-10-22 ENCOUNTER — Ambulatory Visit: Payer: Medicare Other | Attending: Orthopedic Surgery | Admitting: Physical Therapy

## 2017-10-22 DIAGNOSIS — M79601 Pain in right arm: Secondary | ICD-10-CM

## 2017-10-22 DIAGNOSIS — M6281 Muscle weakness (generalized): Secondary | ICD-10-CM

## 2017-10-22 DIAGNOSIS — M5412 Radiculopathy, cervical region: Secondary | ICD-10-CM | POA: Diagnosis not present

## 2017-10-22 NOTE — Therapy (Signed)
[facility] [address], [place], [address] Phone: [phone]   Fax:  [phone]  Physical Therapy Treatment  Patient Details  Name: Rickey Sherman MRN: [id number] Date of Birth: 1946-05-15 Referring Provider: Feliberto Gottron, [place]   Encounter Date: 10/22/2017  PT End of Session - 10/22/17 1158    Visit Number  8    Number of Visits  13    Date for PT Re-Evaluation  10/27/17    Authorization Type  G Codes    Authorization Time Period  6/10    PT Start Time  1118    PT Stop Time  1145    PT Time Calculation (min)  27 min    Activity Tolerance  Patient tolerated treatment well    Behavior During Therapy  [facility] for tasks assessed/performed       No past medical history on file.  No past surgical history on file.  There were no vitals filed for this visit.  Subjective Assessment - 10/22/17 1159    Subjective  Patient reports he still gets some tingling in his R forearm and hand with prolonged driving (hours) but he is not having pain at this time. Some discomfort in his R forearm at times.     Pertinent History  Pt is a 72 y/o M who presents with diagnosis of Radiculopathy of Cervical Region.  His symptoms began in June 2018 with pain in his R shoulder, initially believed to be RTC involvement.  Around this time the pt had been hitting 100 balls/day and pt believes this was the reason for his pain. Had injection in shoulder 8/6 with very mild improvement.  Then took 10 day prednisone taper with no improvement.  Symptoms progressed to a deep ache down RUE and intermittent tingling in R forearm and fingers.  Had an MRI on 9/1 with the results documented below.  Pt had cervical epidural steroid injection on 9/18 and again on 11/6.  Following injections pt reports his symptoms improve except for stinging and numbness and effects of injection last ~6 wks.  Pt reports relief with cervical flexion and increase  in symptoms when he holds his head up for too long with increase in severity of aching and tingling when driving longer than 1 hr.  The pt is unable to relate symptoms to any specific activity, his symptoms seem to worsen as the day progresses.  Pt has been sleeping fine, a few night sweats reported which the MD attributed to the epidural.  No changes in bowel or bladder, no changes in his weight.  Pt does not have symptoms when he golfs and pt continues to hit golf balls on warm days.  Pt denies any shoulder or neck injuries or surgeries in the past.  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  Current pain 1/10, best pain 1/10, worst pain 8/10.  No medications on file but pt reports he is taking the following: Losartan, Metformin/Gilpizide, Omeprazole, Aspirin, Allopurinol, Vitamin B12.     Limitations  -- driving, golfing    How long can you sit comfortably?  1 hour into driving    How long can you stand comfortably?  unlimited    How long can you walk comfortably?  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    Patient Stated Goals  Decrease symptoms, golfing without fear of worsening symptoms    Currently in Pain?  No/denies        Patient reported some discomfort in his R forearm not on the medial epicondyle but near the common extensor muscle bellies and tendons, provided HEP to improve strength and endurance of this area including wrist extension with 2# weight and radial/ulnar deviation with 2# weight.                       PT Education - 10/22/17 1159    Education provided  Yes    Education Details  Discharge instructions and HEP  for forearm discomfort.     Person(s) Educated  Patient    Methods  Explanation;Demonstration;Handout    Comprehension  Verbalized understanding;Returned demonstration       PT Short Term Goals - 10/22/17 1153      PT SHORT TERM GOAL #1   Title  Pt will be independent with HEP for carryover between sessions.    Time  2    Period  Weeks    Status  Achieved        PT Long Term Goals - 10/22/17 1128      PT LONG TERM GOAL #1   Title  Pt will report a decreased worst pain by at least 4 on the NPRS for improved QOL.     Baseline  8/10, 0/10 over the past few days    Time  4    Status  Achieved      PT LONG TERM GOAL #2   Title  Pt will demonstrate improved posture without cues for decreased symptoms     Baseline  See Evaluation note    Time  2    Period  Weeks    Status  Achieved      PT LONG TERM GOAL #3   Title  Pt will be able to drive longer than 2 hrs without change in symptoms    Baseline  Pt with increased symptoms after driving 1 hr-- on 1/3 can drive several hours before numbness feeling     Time  4    Period  Weeks    Status  Partially Met      PT LONG TERM GOAL #4   Title  Pt will improve RUE strength to at least 5-/5 throughout for improved functional use of RUE    Baseline  See Evaluation note    Time  6    Period  Weeks    Status  Achieved            Plan - 10/22/17 1153    Clinical Impression Statement  Patient has made excellent progress with pain management and tolerance for ADLs. While he still gets some numbness with prolonged driving, he has progressed with RUE strength and was provided with strengthening exercises for his forearm as he still had some discomfort there.     Clinical Presentation  Stable    Clinical Decision Making  Moderate    Rehab Potential  Good    PT Frequency  2x / week    PT Duration  6 weeks    PT Treatment/Interventions  ADLs/Self Care Home Management;Aquatic Therapy;Biofeedback;Cryotherapy;Electrical  Stimulation;Iontophoresis 71m/ml Dexamethasone;Moist Heat;Traction;Ultrasound;Parrafin;Contrast Bath;Fluidtherapy;Gait training;Functional mobility  training;Therapeutic activities;Therapeutic exercise;Balance training;Neuromuscular re-education;Patient/family education;Orthotic Fit/Training;Manual techniques;Compression bandaging;Passive range of motion;Dry needling;Energy conservation;Taping    PT Next Visit Plan  Review HEP.  Initiate strengthening program.  Asssess R shoulder joint mobility.  Cervical distraction.      PT Home Exercise Plan  Proper sitting posture, cervical retractions, median nerve glide    Consulted and Agree with Plan of Care  Patient       Patient will benefit from skilled therapeutic intervention in order to improve the following deficits and impairments:  Decreased endurance, Decreased mobility, Decreased range of motion, Decreased strength, Hypomobility, Increased fascial restricitons, Impaired perceived functional ability, Impaired flexibility, Impaired sensation, Impaired UE functional use, Improper body mechanics, Postural dysfunction, Pain  Visit Diagnosis: Radiculopathy, cervical region  Pain in right arm  Muscle weakness (generalized)   G-Codes - 11-07-2017 1152    Functional Assessment Tool Used (Outpatient Only)  NDI, clinical judgement, MMT    Functional Limitation  Carrying, moving and handling objects    Carrying, Moving and Handling Objects Current Status ([id number])  At least 1 percent but less than 20 percent impaired, limited or restricted    Carrying, Moving and Handling Objects Goal Status ([id number])  At least 1 percent but less than 20 percent impaired, limited or restricted    Carrying, Moving and Handling Objects Discharge Status [phone])  At least 1 percent but less than 20 percent impaired, limited or restricted       Problem List There are no active problems to display for this patient.  Royce Macadamia PT, DPT, CSCS    Nov 07, 2017, 12:00  PM  [facility] [address] [place], [address] Phone: [phone]   Fax:  [phone]  Name: JAMAIR CATO MRN: [id number] Date of Birth: [birth year]

## 2017-10-26 ENCOUNTER — Encounter: Payer: Medicare Other | Admitting: Physical Therapy

## 2017-10-29 ENCOUNTER — Encounter: Payer: Medicare Other | Admitting: Physical Therapy

## 2017-11-02 ENCOUNTER — Encounter: Payer: Medicare Other | Admitting: Physical Therapy

## 2017-11-05 ENCOUNTER — Encounter: Payer: Medicare Other | Admitting: Physical Therapy

## 2018-08-18 ENCOUNTER — Other Ambulatory Visit: Payer: Self-pay

## 2018-08-18 ENCOUNTER — Encounter
Admission: RE | Disposition: A | Discharge status: Home / Self Care | Payer: Self-pay | Source: Ambulatory Visit | Attending: Internal Medicine

## 2018-08-18 ENCOUNTER — Ambulatory Visit
Admission: RE | Admit: 2018-08-18 | Discharge: 2018-08-18 | Disposition: A | Discharge status: Home / Self Care | Payer: Medicare Other | Source: Ambulatory Visit | Attending: Internal Medicine | Admitting: Internal Medicine

## 2018-08-18 DIAGNOSIS — E1140 Type 2 diabetes mellitus with diabetic neuropathy, unspecified: Secondary | ICD-10-CM | POA: Insufficient documentation

## 2018-08-18 DIAGNOSIS — Z9889 Other specified postprocedural states: Secondary | ICD-10-CM | POA: Insufficient documentation

## 2018-08-18 DIAGNOSIS — Z7982 Long term (current) use of aspirin: Secondary | ICD-10-CM | POA: Insufficient documentation

## 2018-08-18 DIAGNOSIS — K219 Gastro-esophageal reflux disease without esophagitis: Secondary | ICD-10-CM | POA: Diagnosis not present

## 2018-08-18 DIAGNOSIS — I25110 Atherosclerotic heart disease of native coronary artery with unstable angina pectoris: Secondary | ICD-10-CM | POA: Insufficient documentation

## 2018-08-18 DIAGNOSIS — Z79899 Other long term (current) drug therapy: Secondary | ICD-10-CM | POA: Diagnosis not present

## 2018-08-18 DIAGNOSIS — R9430 Abnormal result of cardiovascular function study, unspecified: Secondary | ICD-10-CM

## 2018-08-18 DIAGNOSIS — M109 Gout, unspecified: Secondary | ICD-10-CM | POA: Diagnosis not present

## 2018-08-18 DIAGNOSIS — I25119 Atherosclerotic heart disease of native coronary artery with unspecified angina pectoris: Secondary | ICD-10-CM | POA: Insufficient documentation

## 2018-08-18 DIAGNOSIS — Z8249 Family history of ischemic heart disease and other diseases of the circulatory system: Secondary | ICD-10-CM | POA: Diagnosis not present

## 2018-08-18 DIAGNOSIS — Z794 Long term (current) use of insulin: Secondary | ICD-10-CM | POA: Diagnosis not present

## 2018-08-18 DIAGNOSIS — E785 Hyperlipidemia, unspecified: Secondary | ICD-10-CM | POA: Insufficient documentation

## 2018-08-18 HISTORY — PX: LEFT HEART CATH AND CORONARY ANGIOGRAPHY: CATH118249

## 2018-08-18 HISTORY — DX: Atherosclerotic heart disease of native coronary artery without angina pectoris: I25.10

## 2018-08-18 HISTORY — DX: Malignant (primary) neoplasm, unspecified: C80.1

## 2018-08-18 HISTORY — DX: Essential (primary) hypertension: I10

## 2018-08-18 HISTORY — DX: Type 2 diabetes mellitus without complications: E11.9

## 2018-08-18 LAB — CARDIAC CATHETERIZATION: Cath EF Quantitative: 60 %

## 2018-08-18 LAB — GLUCOSE, CAPILLARY
GLUCOSE-CAPILLARY: 153 mg/dL — AB (ref 70–99)
Glucose-Capillary: 171 mg/dL — ABNORMAL HIGH (ref 70–99)

## 2018-08-18 SURGERY — LEFT HEART CATH AND CORONARY ANGIOGRAPHY
Anesthesia: Moderate Sedation | Laterality: Left

## 2018-08-18 MED ORDER — SODIUM CHLORIDE 0.9 % WEIGHT BASED INFUSION: 1.0000 mL/kg/h | INTRAVENOUS | Status: DC

## 2018-08-18 MED ORDER — ASPIRIN 81 MG PO CHEW
81.0000 mg | CHEWABLE_TABLET | ORAL | Status: AC
  Administered 2018-08-18: 81 mg via ORAL

## 2018-08-18 MED ORDER — MIDAZOLAM HCL 2 MG/2ML IJ SOLN
INTRAMUSCULAR | Status: AC
  Filled 2018-08-18: qty 2

## 2018-08-18 MED ORDER — FENTANYL CITRATE (PF) 100 MCG/2ML IJ SOLN
INTRAMUSCULAR | Status: AC
  Filled 2018-08-18: qty 2

## 2018-08-18 MED ORDER — SODIUM CHLORIDE 0.9% FLUSH: 3.0000 mL | INTRAVENOUS | Status: DC | PRN

## 2018-08-18 MED ORDER — ONDANSETRON HCL 4 MG/2ML IJ SOLN: 4.0000 mg | Freq: Four times a day (QID) | INTRAMUSCULAR | Status: DC | PRN

## 2018-08-18 MED ORDER — SODIUM CHLORIDE 0.9 % WEIGHT BASED INFUSION: 3.0000 mL/kg/h | INTRAVENOUS | Status: AC

## 2018-08-18 MED ORDER — ATORVASTATIN CALCIUM 80 MG PO TABS: 80.0000 mg | ORAL_TABLET | Freq: Every day | ORAL | Status: DC

## 2018-08-18 MED ORDER — ACETAMINOPHEN 500 MG PO TABS
1000.0000 mg | ORAL_TABLET | Freq: Four times a day (QID) | ORAL | Status: AC | PRN
  Administered 2018-08-18: 1000 mg via ORAL

## 2018-08-18 MED ORDER — SODIUM CHLORIDE 0.9% FLUSH: 3.0000 mL | Freq: Two times a day (BID) | INTRAVENOUS | Status: DC

## 2018-08-18 MED ORDER — ATORVASTATIN CALCIUM 80 MG PO TABS
80.0000 mg | ORAL_TABLET | Freq: Every day | ORAL | Status: DC
  Administered 2018-08-18: 80 mg via ORAL
  Filled 2018-08-18: qty 1

## 2018-08-18 MED ORDER — SODIUM CHLORIDE 0.9 % IV SOLN: 250.0000 mL | INTRAVENOUS | Status: DC | PRN

## 2018-08-18 MED ORDER — VERAPAMIL HCL 2.5 MG/ML IV SOLN
INTRAVENOUS | Status: AC
  Filled 2018-08-18: qty 2

## 2018-08-18 MED ORDER — ASPIRIN 81 MG PO CHEW
CHEWABLE_TABLET | ORAL | Status: AC
  Filled 2018-08-18: qty 1

## 2018-08-18 MED ORDER — ASPIRIN 81 MG PO CHEW: 81.0000 mg | CHEWABLE_TABLET | Freq: Every day | ORAL | Status: DC

## 2018-08-18 MED ORDER — VERAPAMIL HCL 2.5 MG/ML IV SOLN
INTRAVENOUS | Status: DC | PRN
  Administered 2018-08-18: 2.5 mg via INTRA_ARTERIAL

## 2018-08-18 MED ORDER — MIDAZOLAM HCL 2 MG/2ML IJ SOLN
INTRAMUSCULAR | Status: DC | PRN
  Administered 2018-08-18: 1 mg via INTRAVENOUS

## 2018-08-18 MED ORDER — HEPARIN SODIUM (PORCINE) 1000 UNIT/ML IJ SOLN
INTRAMUSCULAR | Status: DC | PRN
  Administered 2018-08-18: 4000 [IU] via INTRAVENOUS

## 2018-08-18 MED ORDER — ACETAMINOPHEN 500 MG PO TABS
ORAL_TABLET | ORAL | Status: AC
  Filled 2018-08-18: qty 2

## 2018-08-18 MED ORDER — IOPAMIDOL (ISOVUE-300) INJECTION 61%
INTRAVENOUS | Status: DC | PRN
  Administered 2018-08-18: 90 mL via INTRA_ARTERIAL

## 2018-08-18 MED ORDER — FENTANYL CITRATE (PF) 100 MCG/2ML IJ SOLN
INTRAMUSCULAR | Status: DC | PRN
  Administered 2018-08-18 (×2): 25 ug via INTRAVENOUS

## 2018-08-18 MED ORDER — ACETAMINOPHEN 325 MG PO TABS: 650.0000 mg | ORAL_TABLET | ORAL | Status: DC | PRN

## 2018-08-18 MED ORDER — HEPARIN SODIUM (PORCINE) 1000 UNIT/ML IJ SOLN
INTRAMUSCULAR | Status: AC
  Filled 2018-08-18: qty 1

## 2018-08-18 MED ORDER — HEPARIN (PORCINE) IN NACL 1000-0.9 UT/500ML-% IV SOLN
INTRAVENOUS | Status: AC
  Filled 2018-08-18: qty 1000

## 2018-08-18 SURGICAL SUPPLY — 10 items
CATH INFINITI 5 FR JL3.5 (CATHETERS) ×2 IMPLANT
CATH INFINITI 5FR JL4 (CATHETERS) ×2 IMPLANT
CATH INFINITI 5FR TG (CATHETERS) IMPLANT
CATH INFINITI JR4 5F (CATHETERS) ×2 IMPLANT
DEVICE RAD TR BAND REGULAR (VASCULAR PRODUCTS) ×2 IMPLANT
GLIDESHEATH SLEND A-KIT 6F 22G (SHEATH) ×2 IMPLANT
GLIDESHEATH SLEND SS 6F .021 (SHEATH) ×2 IMPLANT
KIT MANI 3VAL PERCEP (MISCELLANEOUS) ×2 IMPLANT
PACK CARDIAC CATH (CUSTOM PROCEDURE TRAY) ×2 IMPLANT
WIRE ROSEN-J .035X260CM (WIRE) ×2 IMPLANT

## 2018-08-18 NOTE — Discharge Instructions (Addendum)
Heart-Healthy Eating Plan Heart-healthy meal planning includes:  Limiting unhealthy fats.  Increasing healthy fats.  Making other small dietary changes.  You may need to talk with your doctor or a diet specialist (dietitian) to create an eating plan that is right for you. What types of fat should I choose?  Choose healthy fats. These include olive oil and canola oil, flaxseeds, walnuts, almonds, and seeds.  Eat more omega-3 fats. These include salmon, mackerel, sardines, tuna, flaxseed oil, and ground flaxseeds. Try to eat fish at least twice each week.  Limit saturated fats. ? Saturated fats are often found in animal products, such as meats, butter, and cream. ? Plant sources of saturated fats include palm oil, palm kernel oil, and coconut oil.  Avoid foods with partially hydrogenated oils in them. These include stick margarine, some tub margarines, cookies, crackers, and other baked goods. These contain trans fats. What general guidelines do I need to follow?  Check food labels carefully. Identify foods with trans fats or high amounts of saturated fat.  Fill one half of your plate with vegetables and green salads. Eat 4-5 servings of vegetables per day. A serving of vegetables is: ? 1 cup of raw leafy vegetables. ?  cup of raw or cooked cut-up vegetables. ?  cup of vegetable juice.  Fill one fourth of your plate with whole grains. Look for the word "whole" as the first word in the ingredient list.  Fill one fourth of your plate with lean protein foods.  Eat 4-5 servings of fruit per day. A serving of fruit is: ? One medium whole fruit. ?  cup of dried fruit. ?  cup of fresh, frozen, or canned fruit. ?  cup of 100% fruit juice.  Eat more foods that contain soluble fiber. These include apples, broccoli, carrots, beans, peas, and barley. Try to get 20-30 g of fiber per day.  Eat more home-cooked food. Eat less restaurant, buffet, and fast food.  Limit or avoid  alcohol.  Limit foods high in starch and sugar.  Avoid fried foods.  Avoid frying your food. Try baking, boiling, grilling, or broiling it instead. You can also reduce fat by: ? Removing the skin from poultry. ? Removing all visible fats from meats. ? Skimming the fat off of stews, soups, and gravies before serving them. ? Steaming vegetables in water or broth.  Lose weight if you are overweight.  Eat 4-5 servings of nuts, legumes, and seeds per week: ? One serving of dried beans or legumes equals  cup after being cooked. ? One serving of nuts equals 1 ounces. ? One serving of seeds equals  ounce or one tablespoon.  You may need to keep track of how much salt or sodium you eat. This is especially true if you have high blood pressure. Talk with your doctor or dietitian to get more information. What foods can I eat? Grains Breads, including [place], white, pita, wheat, raisin, rye, oatmeal, and [place]. Tortillas that are neither fried nor made with lard or trans fat. Low-fat rolls, including hotdog and hamburger buns and English muffins. Biscuits. Muffins. Waffles. Pancakes. Light popcorn. Whole-grain cereals. Flatbread. Melba toast. Pretzels. Breadsticks. Rusks. Low-fat snacks. Low-fat crackers, including oyster, saltine, matzo, graham, animal, and rye. Rice and pasta, including brown rice and pastas that are made with whole wheat. Vegetables All vegetables. Fruits All fruits, but limit coconut. Meats and Other Protein Sources Lean, well-trimmed beef, veal, pork, and lamb. Chicken and [place] without skin. All fish and shellfish.  Wild duck, rabbit, pheasant, and venison. Egg whites or low-cholesterol egg substitutes. Dried beans, peas, lentils, and tofu. Seeds and most nuts. Dairy Low-fat or nonfat cheeses, including ricotta, string, and mozzarella. Skim or 1% milk that is liquid, powdered, or evaporated. Buttermilk that is made with low-fat milk. Nonfat or low-fat  yogurt. Beverages Mineral water. Diet carbonated beverages. Sweets and Desserts Sherbets and fruit ices. Honey, jam, marmalade, jelly, and syrups. Meringues and gelatins. Pure sugar candy, such as hard candy, jelly beans, gumdrops, mints, marshmallows, and small amounts of dark chocolate. [employer] Eat all sweets and desserts in moderation. Fats and Oils Nonhydrogenated (trans-free) margarines. Vegetable oils, including soybean, sesame, sunflower, olive, peanut, safflower, corn, canola, and cottonseed. Salad dressings or mayonnaise made with a vegetable oil. Limit added fats and oils that you use for cooking, baking, salads, and as spreads. Other Cocoa powder. Coffee and tea. All seasonings and condiments. The items listed above may not be a complete list of recommended foods or beverages. Contact your dietitian for more options. What foods are not recommended? Grains Breads that are made with saturated or trans fats, oils, or whole milk. Croissants. Butter rolls. Cheese breads. Sweet rolls. Donuts. Buttered popcorn. Chow mein noodles. High-fat crackers, such as cheese or butter crackers. Meats and Other Protein Sources Fatty meats, such as hotdogs, short ribs, sausage, spareribs, bacon, rib eye roast or steak, and mutton. High-fat deli meats, such as salami and bologna. Caviar. Domestic duck and goose. Organ meats, such as kidney, liver, sweetbreads, and heart. Dairy Cream, sour cream, cream cheese, and creamed cottage cheese. Whole-milk cheeses, including blue (bleu), Monte[place] Jack, [place], [place], American, [place], Swiss, cheddar, [place], and [place]. Whole or 2% milk that is liquid, evaporated, or condensed. Whole buttermilk. Cream sauce or high-fat cheese sauce. Yogurt that is made from whole milk. Beverages Regular sodas and juice drinks with added sugar. Sweets and Desserts Frosting. Pudding. Cookies. Cakes other than angel food cake. Candy that has milk chocolate or white  chocolate, hydrogenated fat, butter, coconut, or unknown ingredients. Buttered syrups. Full-fat ice cream or ice cream drinks. Fats and Oils Gravy that has suet, meat fat, or shortening. Cocoa butter, hydrogenated oils, palm oil, coconut oil, palm kernel oil. These can often be found in baked products, candy, fried foods, nondairy creamers, and whipped toppings. Solid fats and shortenings, including bacon fat, salt pork, lard, and butter. Nondairy cream substitutes, such as coffee creamers and sour cream substitutes. Salad dressings that are made of unknown oils, cheese, or sour cream. The items listed above may not be a complete list of foods and beverages to avoid. Contact your dietitian for more information. This information is not intended to replace advice given to you by your health care provider. Make sure you discuss any questions you have with your health care provider. Document Released: 04/06/2012 Document Revised: 03/13/2016 Document Reviewed: 03/30/2014 Elsevier Interactive Patient Education  2018 [place] Refer to this sheet in the next few weeks. These instructions provide you with information about caring for yourself after your procedure. Your health care provider may also give you more specific instructions. Your treatment has been planned according to current medical practices, but problems sometimes occur. Call your health care provider if you have any problems or questions after your procedure. What can I expect after the procedure? After your procedure, it is typical to have the following:  Bruising at the radial site that usually fades within 1-2 weeks.  Blood collecting in the tissue (  hematoma) that may be painful to the touch. It should usually decrease in size and tenderness within 1-2 weeks.  Follow these instructions at home:  Take medicines only as directed by your health care provider.  You may shower 24-48 hours after the procedure or as  directed by your health care provider. Remove the bandage (dressing) and gently wash the site with plain soap and water. Pat the area dry with a clean towel. Do not rub the site, because this may cause bleeding.  Do not take baths, swim, or use a hot tub until your health care provider approves.  Check your insertion site every day for redness, swelling, or drainage.  Do not apply powder or lotion to the site.  Do not flex or bend the affected arm for 24 hours or as directed by your health care provider.  Do not push or pull heavy objects with the affected arm for 24 hours or as directed by your health care provider.  Do not lift over 10 lb (4.5 kg) for 5 days after your procedure or as directed by your health care provider.  Ask your health care provider when it is okay to: ? Return to work or school. ? Resume usual physical activities or sports. ? Resume sexual activity.  Do not drive home if you are discharged the same day as the procedure. Have someone else drive you.  You may drive 24 hours after the procedure unless otherwise instructed by your health care provider.  Do not operate machinery or power tools for 24 hours after the procedure.  If your procedure was done as an outpatient procedure, which means that you went home the same day as your procedure, a responsible adult should be with you for the first 24 hours after you arrive home.  Keep all follow-up visits as directed by your health care provider. This is important. Contact a health care provider if:  You have a fever.  You have chills.  You have increased bleeding from the radial site. Hold pressure on the site. Get help right away if:  You have unusual pain at the radial site.  You have redness, warmth, or swelling at the radial site.  You have drainage (other than a small amount of blood on the dressing) from the radial site.  The radial site is bleeding, and the bleeding does not stop after 30 minutes of  holding steady pressure on the site.  Your arm or hand becomes pale, cool, tingly, or numb. This information is not intended to replace advice given to you by your health care provider. Make sure you discuss any questions you have with your health care provider. Document Released: 11/08/2010 Document Revised: 03/13/2016 Document Reviewed: 04/24/2014 Elsevier Interactive Patient Education  2018 [employer].

## 2018-08-19 ENCOUNTER — Encounter: Payer: Self-pay | Admitting: Internal Medicine

## 2018-09-02 DIAGNOSIS — Z951 Presence of aortocoronary bypass graft: Secondary | ICD-10-CM | POA: Insufficient documentation

## 2018-09-08 MED ORDER — ASPIRIN 81 MG PO CHEW
81.00 | CHEWABLE_TABLET | ORAL | Status: DC
Start: 2018-09-09 — End: 2018-09-08

## 2018-09-08 MED ORDER — GLUCAGON HCL RDNA (DIAGNOSTIC) 1 MG IJ SOLR
1.00 | INTRAMUSCULAR | Status: DC
Start: ? — End: 2018-09-08

## 2018-09-08 MED ORDER — DEXTROSE 50 % IV SOLN
12.50 | INTRAVENOUS | Status: DC
Start: ? — End: 2018-09-08

## 2018-09-08 MED ORDER — ALLOPURINOL 100 MG PO TABS
100.00 | ORAL_TABLET | ORAL | Status: DC
Start: 2018-09-09 — End: 2018-09-08

## 2018-09-08 MED ORDER — INSULIN LISPRO 100 UNIT/ML ~~LOC~~ SOLN
8.00 | SUBCUTANEOUS | Status: DC
Start: 2018-09-09 — End: 2018-09-08

## 2018-09-08 MED ORDER — SENNOSIDES-DOCUSATE SODIUM 8.6-50 MG PO TABS
2.00 | ORAL_TABLET | ORAL | Status: DC
Start: 2018-09-08 — End: 2018-09-08

## 2018-09-08 MED ORDER — ONDANSETRON HCL 4 MG/2ML IJ SOLN
4.00 | INTRAMUSCULAR | Status: DC
Start: ? — End: 2018-09-08

## 2018-09-08 MED ORDER — AMIODARONE HCL 200 MG PO TABS
200.00 | ORAL_TABLET | ORAL | Status: DC
Start: 2018-09-09 — End: 2018-09-08

## 2018-09-08 MED ORDER — TRAMADOL HCL 50 MG PO TABS
25.00 | ORAL_TABLET | ORAL | Status: DC
Start: ? — End: 2018-09-08

## 2018-09-08 MED ORDER — ATORVASTATIN CALCIUM 80 MG PO TABS
80.00 | ORAL_TABLET | ORAL | Status: DC
Start: 2018-09-09 — End: 2018-09-08

## 2018-09-08 MED ORDER — PANTOPRAZOLE SODIUM 40 MG PO TBEC
40.00 | DELAYED_RELEASE_TABLET | ORAL | Status: DC
Start: 2018-09-09 — End: 2018-09-08

## 2018-09-08 MED ORDER — BACITRACIN 500 UNIT/GM EX OINT
TOPICAL_OINTMENT | CUTANEOUS | Status: DC
Start: ? — End: 2018-09-08

## 2018-09-08 MED ORDER — INSULIN GLARGINE 100 UNIT/ML ~~LOC~~ SOLN
25.00 | SUBCUTANEOUS | Status: DC
Start: 2018-09-08 — End: 2018-09-08

## 2018-09-08 MED ORDER — TAMSULOSIN HCL 0.4 MG PO CAPS
0.40 | ORAL_CAPSULE | ORAL | Status: DC
Start: 2018-09-09 — End: 2018-09-08

## 2018-09-08 MED ORDER — LIDOCAINE HCL 1 % IJ SOLN
0.50 | INTRAMUSCULAR | Status: DC
Start: ? — End: 2018-09-08

## 2018-09-08 MED ORDER — INSULIN LISPRO 100 UNIT/ML ~~LOC~~ SOLN
.00 | SUBCUTANEOUS | Status: DC
Start: 2018-09-08 — End: 2018-09-08

## 2018-09-08 MED ORDER — GENERIC EXTERNAL MEDICATION
0.00 | Status: DC
Start: ? — End: 2018-09-08

## 2018-09-08 MED ORDER — GENERIC EXTERNAL MEDICATION
12.50 | Status: DC
Start: 2018-09-08 — End: 2018-09-08

## 2018-09-08 MED ORDER — INSULIN LISPRO 100 UNIT/ML ~~LOC~~ SOLN
8.00 | SUBCUTANEOUS | Status: DC
Start: 2018-09-08 — End: 2018-09-08

## 2018-09-08 MED ORDER — ACETAMINOPHEN 325 MG PO TABS
650.00 | ORAL_TABLET | ORAL | Status: DC
Start: 2018-09-08 — End: 2018-09-08

## 2018-09-08 MED ORDER — POLYETHYLENE GLYCOL 3350 17 G PO PACK
17.00 | PACK | ORAL | Status: DC
Start: 2018-09-09 — End: 2018-09-08

## 2018-10-27 ENCOUNTER — Encounter: Payer: Medicare Other | Attending: Internal Medicine | Admitting: Internal Medicine

## 2018-10-27 DIAGNOSIS — L97222 Non-pressure chronic ulcer of left calf with fat layer exposed: Secondary | ICD-10-CM | POA: Diagnosis not present

## 2018-10-27 DIAGNOSIS — I2510 Atherosclerotic heart disease of native coronary artery without angina pectoris: Secondary | ICD-10-CM | POA: Insufficient documentation

## 2018-10-27 DIAGNOSIS — Z8249 Family history of ischemic heart disease and other diseases of the circulatory system: Secondary | ICD-10-CM | POA: Insufficient documentation

## 2018-10-27 DIAGNOSIS — Z951 Presence of aortocoronary bypass graft: Secondary | ICD-10-CM | POA: Insufficient documentation

## 2018-10-27 DIAGNOSIS — M109 Gout, unspecified: Secondary | ICD-10-CM | POA: Insufficient documentation

## 2018-10-27 DIAGNOSIS — T8131XD Disruption of external operation (surgical) wound, not elsewhere classified, subsequent encounter: Secondary | ICD-10-CM | POA: Insufficient documentation

## 2018-10-27 DIAGNOSIS — M5412 Radiculopathy, cervical region: Secondary | ICD-10-CM | POA: Diagnosis not present

## 2018-10-27 DIAGNOSIS — E1142 Type 2 diabetes mellitus with diabetic polyneuropathy: Secondary | ICD-10-CM | POA: Insufficient documentation

## 2018-10-27 DIAGNOSIS — Z85828 Personal history of other malignant neoplasm of skin: Secondary | ICD-10-CM | POA: Diagnosis not present

## 2018-10-27 DIAGNOSIS — E11621 Type 2 diabetes mellitus with foot ulcer: Secondary | ICD-10-CM | POA: Diagnosis not present

## 2018-10-27 DIAGNOSIS — I10 Essential (primary) hypertension: Secondary | ICD-10-CM | POA: Diagnosis not present

## 2018-10-27 DIAGNOSIS — Z809 Family history of malignant neoplasm, unspecified: Secondary | ICD-10-CM | POA: Insufficient documentation

## 2018-10-28 ENCOUNTER — Encounter: Payer: Medicare Other | Admitting: *Deleted

## 2018-10-28 ENCOUNTER — Encounter: Payer: Self-pay | Admitting: *Deleted

## 2018-10-28 VITALS — Ht 70.5 in | Wt 165.0 lb

## 2018-10-28 DIAGNOSIS — E11621 Type 2 diabetes mellitus with foot ulcer: Secondary | ICD-10-CM | POA: Diagnosis not present

## 2018-10-28 DIAGNOSIS — Z951 Presence of aortocoronary bypass graft: Secondary | ICD-10-CM

## 2018-10-28 NOTE — Progress Notes (Signed)
Daily Session Note  Patient Details  Name: Rickey Sherman MRN: [id number] Date of Birth: Mar 06, 1946 Referring Provider:     Cardiac Rehab from 10/28/2018 in [facility]  Referring Provider  Edwina Barth      Encounter Date: 10/28/2018  Check In: Session Check In - 10/28/18 1506      Check-In   Supervising physician immediately available to respond to emergencies  See telemetry face sheet for immediately available ER MD    Location  [facility]-Cardiac & Pulmonary Rehab    Staff Present  Renita Papa, RN Vickki Hearing, BA, ACSM CEP, Exercise Physiologist    Warm-up and Cool-down  Not performed (comment)   med review completed   Resistance Training Performed  Yes    VAD Patient?  No    PAD/SET Patient?  No      Pain Assessment   Currently in Pain?  No/denies        Exercise Prescription Changes - 10/28/18 1500      Response to Exercise   Blood Pressure (Admit)  110/58    Blood Pressure (Exercise)  156/58    Blood Pressure (Exit)  126/58    Heart Rate (Admit)  64 bpm    Heart Rate (Exercise)  107 bpm    Heart Rate (Exit)  71 bpm    Oxygen Saturation (Admit)  99 %    Oxygen Saturation (Exit)  99 %    Rating of Perceived Exertion (Exercise)  12       Social History   Tobacco Use  Smoking Status Never Smoker  Smokeless Tobacco Never Used    Goals Met:  Proper associated with RPD/PD & O2 Sat Exercise tolerated well No report of cardiac concerns or symptoms Strength training completed today  Goals Unmet:  Not Applicable  Comments: Med Review completed   Dr. Emily Filbert is Medical Director for [place] and [facility].

## 2018-10-28 NOTE — Patient Instructions (Signed)
Patient Instructions  Patient Details  Name: Rickey Sherman MRN: [id number] Date of Birth: 1946-05-03 Referring Provider:  Baxter Hire, MD  Below are your personal goals for exercise, nutrition, and risk factors. Our goal is to help you stay on track towards obtaining and maintaining these goals. We will be discussing your progress on these goals with you throughout the program.  Initial Exercise Prescription: Initial Exercise Prescription - 10/28/18 1500      Date of Initial Exercise RX and Referring Provider   Date  10/28/18    Referring Provider  Edwina Barth      Treadmill   MPH  2.8    Grade  1.5    Minutes  15    METs  3.7      Recumbant Bike   Level  4    RPM  60    Watts  46    Minutes  15    METs  3.7      REL-XR   Level  3    Speed  50    Minutes  15    METs  3.7      Prescription Details   Frequency (times per week)  3    Duration  Progress to 45 minutes of aerobic exercise without signs/symptoms of physical distress      Intensity   THRR 40-80% of Max Heartrate  97-132    Ratings of Perceived Exertion  11-13    Perceived Dyspnea  0-4      Resistance Training   Training Prescription  Yes    Weight  3 lb    Reps  10-15       Exercise Goals: Frequency: Be able to perform aerobic exercise two to three times per week in program working toward 2-5 days per week of home exercise.  Intensity: Work with a perceived exertion of 11 (fairly light) - 15 (hard) while following your exercise prescription.  We will make changes to your prescription with you as you progress through the program.   Duration: Be able to do 30 to 45 minutes of continuous aerobic exercise in addition to a 5 minute warm-up and a 5 minute cool-down routine.   Nutrition Goals: Your personal nutrition goals will be established when you do your nutrition analysis with the dietician.  The following are general nutrition guidelines to follow: Cholesterol < 200mg /day Sodium <  1500mg /day Fiber: Men over 50 yrs - 30 grams per day  Personal Goals: Personal Goals and Risk Factors at Admission - 10/28/18 1523      Core Components/Risk Factors/Patient Goals on Admission    Weight Management  Yes;Weight Maintenance    Intervention  Weight Management: Develop a combined nutrition and exercise program designed to reach desired caloric intake, while maintaining appropriate intake of nutrient and fiber, sodium and fats, and appropriate energy expenditure required for the weight goal.;Weight Management: Provide education and appropriate resources to help participant work on and attain dietary goals.    Admit Weight  165 lb (74.8 kg)    Expected Outcomes  Short Term: Continue to assess and modify interventions until short term weight is achieved;Long Term: Adherence to nutrition and physical activity/exercise program aimed toward attainment of established weight goal;Weight Maintenance: Understanding of the daily nutrition guidelines, which includes 25-35% calories from fat, 7% or less cal from saturated fats, less than 200mg  cholesterol, less than 1.5gm of sodium, & 5 or more servings of fruits and vegetables daily;Understanding recommendations for meals to include 15-35%  energy as protein, 25-35% energy from fat, 35-60% energy from carbohydrates, less than 200mg  of dietary cholesterol, 20-35 gm of total fiber daily;Understanding of distribution of calorie intake throughout the day with the consumption of 4-5 meals/snacks    Diabetes  Yes    Intervention  Provide education about signs/symptoms and action to take for hypo/hyperglycemia.;Provide education about proper nutrition, including hydration, and aerobic/resistive exercise prescription along with prescribed medications to achieve blood glucose in normal ranges: Fasting glucose 65-99 mg/dL    Expected Outcomes  Long Term: Attainment of HbA1C < 7%.    Hypertension  Yes    Intervention  Provide education on lifestyle modifcations  including regular physical activity/exercise, weight management, moderate sodium restriction and increased consumption of fresh fruit, vegetables, and low fat dairy, alcohol moderation, and smoking cessation.;Monitor prescription use compliance.    Expected Outcomes  Short Term: Continued assessment and intervention until BP is < 140/61mm HG in hypertensive participants. < 130/80mm HG in hypertensive participants with diabetes, heart failure or chronic kidney disease.;Long Term: Maintenance of blood pressure at goal levels.    Lipids  Yes    Intervention  Provide education and support for participant on nutrition & aerobic/resistive exercise along with prescribed medications to achieve LDL 70mg , HDL >40mg .    Expected Outcomes  Short Term: Participant states understanding of desired cholesterol values and is compliant with medications prescribed. Participant is following exercise prescription and nutrition guidelines.;Long Term: Cholesterol controlled with medications as prescribed, with individualized exercise RX and with personalized nutrition plan. Value goals: LDL < 70mg , HDL > 40 mg.       Tobacco Use Initial Evaluation: Social History   Tobacco Use  Smoking Status Never Smoker  Smokeless Tobacco Never Used    Exercise Goals and Review: Exercise Goals    Row Name 10/28/18 1501             Exercise Goals   Increase Physical Activity  Yes       Intervention  Provide advice, education, support and counseling about physical activity/exercise needs.;Develop an individualized exercise prescription for aerobic and resistive training based on initial evaluation findings, risk stratification, comorbidities and participant's personal goals.       Expected Outcomes  Short Term: Attend rehab on a regular basis to increase amount of physical activity.;Long Term: Add in home exercise to make exercise part of routine and to increase amount of physical activity.;Long Term: Exercising regularly at  least 3-5 days a week.       Increase Strength and Stamina  Yes       Intervention  Provide advice, education, support and counseling about physical activity/exercise needs.;Develop an individualized exercise prescription for aerobic and resistive training based on initial evaluation findings, risk stratification, comorbidities and participant's personal goals.       Expected Outcomes  Short Term: Increase workloads from initial exercise prescription for resistance, speed, and METs.;Short Term: Perform resistance training exercises routinely during rehab and add in resistance training at home;Long Term: Improve cardiorespiratory fitness, muscular endurance and strength as measured by increased METs and functional capacity (6MWT)       Able to understand and use rate of perceived exertion (RPE) scale  Yes       Intervention  Provide education and explanation on how to use RPE scale       Expected Outcomes  Short Term: Able to use RPE daily in rehab to express subjective intensity level;Long Term:  Able to use RPE to guide intensity level when exercising  independently       Knowledge and understanding of Target Heart Rate Range (THRR)  Yes       Intervention  Provide education and explanation of THRR including how the numbers were predicted and where they are located for reference       Expected Outcomes  Short Term: Able to state/look up THRR;Short Term: Able to use daily as guideline for intensity in rehab;Long Term: Able to use THRR to govern intensity when exercising independently       Able to check pulse independently  Yes       Intervention  Provide education and demonstration on how to check pulse in carotid and radial arteries.;Review the importance of being able to check your own pulse for safety during independent exercise       Expected Outcomes  Short Term: Able to explain why pulse checking is important during independent exercise;Long Term: Able to check pulse independently and accurately        Understanding of Exercise Prescription  Yes       Intervention  Provide education, explanation, and written materials on patient's individual exercise prescription       Expected Outcomes  Short Term: Able to explain program exercise prescription;Long Term: Able to explain home exercise prescription to exercise independently          Copy of goals given to participant.

## 2018-10-28 NOTE — Progress Notes (Signed)
Cardiac Individual Treatment Plan  Patient Details  Name: Rickey Sherman MRN: [id number] Date of Birth: 11-30-1945 Referring Provider:     Cardiac Rehab from 10/28/2018 in [facility] Cardiac and Pulmonary Rehab  Referring Provider  Edwina Barth      Initial Encounter Date:    Cardiac Rehab from 10/28/2018 in [facility] Cardiac and Pulmonary Rehab  Date  10/28/18      Visit Diagnosis: S/P CABG x 4  Patient's Home Medications on Admission:  Current Outpatient Medications:  .  aspirin EC 81 MG tablet, Take 81 mg by mouth every evening., Disp: , Rfl:  .  atorvastatin (LIPITOR) 80 MG tablet, Take by mouth., Disp: , Rfl:  .  B-D ULTRA-FINE 33 LANCETS MISC, Inject into the skin., Disp: , Rfl:  .  Blood Glucose Monitoring Suppl (ONE TOUCH ULTRA 2) w/Device KIT, Use as instructed., Disp: , Rfl:  .  empagliflozin (JARDIANCE) 25 MG TABS tablet, Take by mouth., Disp: , Rfl:  .  glipiZIDE (GLUCOTROL) 10 MG tablet, TAKE 1 TABLET BY MOUTH TWO  TIMES DAILY BEFORE MEALS, Disp: , Rfl:  .  glucose blood (ONE TOUCH ULTRA TEST) test strip, Use once daily Use as instructed., Disp: , Rfl:  .  metFORMIN (GLUCOPHAGE) 1000 MG tablet, Take 1,000 mg by mouth 2 (two) times daily., Disp: , Rfl:  .  metoprolol tartrate (LOPRESSOR) 25 MG tablet, Take by mouth 2 (two) times daily. , Disp: , Rfl:  .  omeprazole (PRILOSEC) 40 MG capsule, TAKE 1 CAPSULE BY MOUTH  ONCE A DAY, Disp: , Rfl:  .  tamsulosin (FLOMAX) 0.4 MG CAPS capsule, Take by mouth., Disp: , Rfl:  .  vitamin B-12 (CYANOCOBALAMIN) 1000 MCG tablet, Take by mouth., Disp: , Rfl:  .  acetaminophen (TYLENOL) 500 MG tablet, Take 1,000 mg by mouth daily as needed for moderate pain or headache., Disp: , Rfl:  .  allopurinol (ZYLOPRIM) 100 MG tablet, Take 100 mg by mouth every evening., Disp: , Rfl:  .  allopurinol (ZYLOPRIM) 100 MG tablet, TAKE 1 TABLET BY MOUTH ONCE DAILY, Disp: , Rfl:  .  fluorouracil (EFUDEX) 5 % cream, Apply 1 application topically daily as needed (skin spots).  , Disp: , Rfl: 1 .  ibuprofen (ADVIL,MOTRIN) 200 MG tablet, Take 400 mg by mouth daily as needed for moderate pain., Disp: , Rfl:  .  isosorbide mononitrate (IMDUR) 30 MG 24 hr tablet, Take 30 mg by mouth daily., Disp: , Rfl:  .  isosorbide mononitrate (IMDUR) 30 MG 24 hr tablet, Take by mouth., Disp: , Rfl:  .  Lancets (ONETOUCH DELICA PLUS [id number]) MISC, , Disp: , Rfl:  .  losartan (COZAAR) 100 MG tablet, Take 100 mg by mouth daily., Disp: , Rfl:  .  losartan (COZAAR) 100 MG tablet, TAKE 1 TABLET BY MOUTH ONCE DAILY, Disp: , Rfl:  .  metoprolol succinate (TOPROL-XL) 25 MG 24 hr tablet, Take 25 mg by mouth daily., Disp: , Rfl:  .  metoprolol succinate (TOPROL-XL) 25 MG 24 hr tablet, Take by mouth., Disp: , Rfl:  .  pioglitazone (ACTOS) 15 MG tablet, Take 15 mg by mouth every evening., Disp: , Rfl:  .  pioglitazone (ACTOS) 15 MG tablet, Take by mouth., Disp: , Rfl:   Past Medical History: Past Medical History:  Diagnosis Date  . Cancer ([place])    basal cell carcinoma  . Coronary artery disease   . Diabetes mellitus without complication ([place])   . Hypertension     Tobacco Use:  Social History   Tobacco Use  Smoking Status Never Smoker  Smokeless Tobacco Never Used    Labs: Recent Review Flowsheet Data    There is no flowsheet data to display.       Exercise Target Goals: Exercise Program Goal: Individual exercise prescription set using results from initial 6 min walk test and THRR while considering  patient's activity barriers and safety.   Exercise Prescription Goal: Initial exercise prescription builds to 30-45 minutes a day of aerobic activity, 2-3 days per week.  Home exercise guidelines will be given to patient during program as part of exercise prescription that the participant will acknowledge.  Activity Barriers & Risk Stratification: Activity Barriers & Cardiac Risk Stratification - 10/28/18 1532      Activity Barriers & Cardiac Risk Stratification   Activity  Barriers  Other (comment)    Comments  leg incision still healing    Cardiac Risk Stratification  High       6 Minute Walk: 6 Minute Walk    Row Name 10/28/18 1502         6 Minute Walk   Phase  Initial     Distance  1520 feet     Walk Time  6 minutes     # of Rest Breaks  0     MPH  2.88     METS  3.93     RPE  12     Perceived Dyspnea   0     VO2 Peak  13.8     Symptoms  No     Resting HR  63 bpm     Resting BP  110/58     Resting Oxygen Saturation   99 %     Exercise Oxygen Saturation  during 6 min walk  99 %     Max Ex. HR  117 bpm     Max Ex. BP  156/58     2 Minute Post BP  126/58        Oxygen Initial Assessment:   Oxygen Re-Evaluation:   Oxygen Discharge (Final Oxygen Re-Evaluation):   Initial Exercise Prescription: Initial Exercise Prescription - 10/28/18 1500      Date of Initial Exercise RX and Referring Provider   Date  10/28/18    Referring Provider  Edwina Barth      Treadmill   MPH  2.8    Grade  1.5    Minutes  15    METs  3.7      Recumbant Bike   Level  4    RPM  60    Watts  46    Minutes  15    METs  3.7      REL-XR   Level  3    Speed  50    Minutes  15    METs  3.7      Prescription Details   Frequency (times per week)  3    Duration  Progress to 45 minutes of aerobic exercise without signs/symptoms of physical distress      Intensity   THRR 40-80% of Max Heartrate  97-132    Ratings of Perceived Exertion  11-13    Perceived Dyspnea  0-4      Resistance Training   Training Prescription  Yes    Weight  3 lb    Reps  10-15       Perform Capillary Blood Glucose checks as needed.  Exercise Prescription Changes: Exercise Prescription Changes  Row Name 10/28/18 1500             Response to Exercise   Blood Pressure (Admit)  110/58       Blood Pressure (Exercise)  156/58       Blood Pressure (Exit)  126/58       Heart Rate (Admit)  64 bpm       Heart Rate (Exercise)  107 bpm       Heart Rate (Exit)  71  bpm       Oxygen Saturation (Admit)  99 %       Oxygen Saturation (Exit)  99 %       Rating of Perceived Exertion (Exercise)  12          Exercise Comments:   Exercise Goals and Review: Exercise Goals    Row Name 10/28/18 1501             Exercise Goals   Increase Physical Activity  Yes       Intervention  Provide advice, education, support and counseling about physical activity/exercise needs.;Develop an individualized exercise prescription for aerobic and resistive training based on initial evaluation findings, risk stratification, comorbidities and participant's personal goals.       Expected Outcomes  Short Term: Attend rehab on a regular basis to increase amount of physical activity.;Long Term: Add in home exercise to make exercise part of routine and to increase amount of physical activity.;Long Term: Exercising regularly at least 3-5 days a week.       Increase Strength and Stamina  Yes       Intervention  Provide advice, education, support and counseling about physical activity/exercise needs.;Develop an individualized exercise prescription for aerobic and resistive training based on initial evaluation findings, risk stratification, comorbidities and participant's personal goals.       Expected Outcomes  Short Term: Increase workloads from initial exercise prescription for resistance, speed, and METs.;Short Term: Perform resistance training exercises routinely during rehab and add in resistance training at home;Long Term: Improve cardiorespiratory fitness, muscular endurance and strength as measured by increased METs and functional capacity (6MWT)       Able to understand and use rate of perceived exertion (RPE) scale  Yes       Intervention  Provide education and explanation on how to use RPE scale       Expected Outcomes  Short Term: Able to use RPE daily in rehab to express subjective intensity level;Long Term:  Able to use RPE to guide intensity level when exercising  independently       Knowledge and understanding of Target Heart Rate Range (THRR)  Yes       Intervention  Provide education and explanation of THRR including how the numbers were predicted and where they are located for reference       Expected Outcomes  Short Term: Able to state/look up THRR;Short Term: Able to use daily as guideline for intensity in rehab;Long Term: Able to use THRR to govern intensity when exercising independently       Able to check pulse independently  Yes       Intervention  Provide education and demonstration on how to check pulse in carotid and radial arteries.;Review the importance of being able to check your own pulse for safety during independent exercise       Expected Outcomes  Short Term: Able to explain why pulse checking is important during independent exercise;Long Term: Able to check pulse independently and accurately  Understanding of Exercise Prescription  Yes       Intervention  Provide education, explanation, and written materials on patient's individual exercise prescription       Expected Outcomes  Short Term: Able to explain program exercise prescription;Long Term: Able to explain home exercise prescription to exercise independently          Exercise Goals Re-Evaluation :   Discharge Exercise Prescription (Final Exercise Prescription Changes): Exercise Prescription Changes - 10/28/18 1500      Response to Exercise   Blood Pressure (Admit)  110/58    Blood Pressure (Exercise)  156/58    Blood Pressure (Exit)  126/58    Heart Rate (Admit)  64 bpm    Heart Rate (Exercise)  107 bpm    Heart Rate (Exit)  71 bpm    Oxygen Saturation (Admit)  99 %    Oxygen Saturation (Exit)  99 %    Rating of Perceived Exertion (Exercise)  12       Nutrition:  Target Goals: Understanding of nutrition guidelines, daily intake of sodium <1539m, cholesterol <2085m calories 30% from fat and 7% or less from saturated fats, daily to have 5 or more servings of  fruits and vegetables.  Biometrics: Pre Biometrics - 10/28/18 1500      Pre Biometrics   Height  5' 10.5" (1.791 m)    Weight  165 lb (74.8 kg)    Waist Circumference  37 inches    Hip Circumference  38.5 inches    Waist to Hip Ratio  0.96 %    BMI (Calculated)  23.33    Single Leg Stand  25 seconds        Nutrition Therapy Plan and Nutrition Goals: Nutrition Therapy & Goals - 10/28/18 1529      Intervention Plan   Intervention  Prescribe, educate and counsel regarding individualized specific dietary modifications aiming towards targeted core components such as weight, hypertension, lipid management, diabetes, heart failure and other comorbidities.;Nutrition handout(s) given to patient.    Expected Outcomes  Short Term Goal: Understand basic principles of dietary content, such as calories, fat, sodium, cholesterol and nutrients.;Short Term Goal: A plan has been developed with personal nutrition goals set during dietitian appointment.;Long Term Goal: Adherence to prescribed nutrition plan.       Nutrition Assessments: Nutrition Assessments - 10/28/18 1529      MEDFICTS Scores   Pre Score  60       Nutrition Goals Re-Evaluation:   Nutrition Goals Discharge (Final Nutrition Goals Re-Evaluation):   Psychosocial: Target Goals: Acknowledge presence or absence of significant depression and/or stress, maximize coping skills, provide positive support system. Participant is able to verbalize types and ability to use techniques and skills needed for reducing stress and depression.   Initial Review & Psychosocial Screening: Initial Psych Review & Screening - 10/28/18 1526      Initial Review   Current issues with  Current Stress Concerns    Source of Stress Concerns  Unable to participate in former interests or hobbies;Unable to perform yard/household activities    Comments  He is almost 2 months post CABG. 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 He is used to playing golf about 3 x a week and lives a social active lifestyle.       Family Dynamics   Good Support System?  Yes   Life partner (EParke Simmers    Barriers  Psychosocial barriers to participate in program  There are no identifiable barriers or psychosocial needs.;The patient should benefit from training in stress management and relaxation.      Screening Interventions   Interventions  Encouraged to exercise;Program counselor consult;To provide support and resources with identified psychosocial needs;Provide feedback about the scores to participant    Expected Outcomes  Short Term goal: Utilizing psychosocial counselor, staff and physician to assist with identification of specific Stressors or current issues interfering with healing process. Setting desired goal for each stressor or current issue identified.;Long Term Goal: Stressors or current issues are controlled or eliminated.;Short Term goal: Identification and review with participant of any Quality of Life or Depression concerns found by scoring the questionnaire.;Long Term goal: The participant improves quality of Life and PHQ9 Scores as seen by post scores and/or verbalization of changes       Quality of Life Scores:  Quality of Life - 10/28/18 1529      Quality of Life   Select  Quality of Life      Quality of Life Scores   Health/Function Pre  26.8 %    Socioeconomic Pre  28.63 %    Psych/Spiritual Pre  26.57 %    Family Pre  28.8 %    GLOBAL Pre  27.46 %      Scores of 19 and below usually indicate a poorer quality of life in these areas.  A difference of  2-3 points is a clinically meaningful difference.  A difference of 2-3 points in the total score of the Quality of Life Index has been associated with significant improvement in overall quality of life, self-image, physical symptoms, and general health in studies assessing change in quality of life.  PHQ-9: Recent Review Flowsheet Data    Depression  screen [facility] 2/9 10/28/2018   Decreased Interest 0   Down, Depressed, Hopeless 0   PHQ - 2 Score 0   Altered sleeping 1   Tired, decreased energy 0   Change in appetite 0   Feeling bad or failure about yourself  1   Trouble concentrating 0   Moving slowly or fidgety/restless 0   Suicidal thoughts 0   PHQ-9 Score 2   Difficult doing work/chores Not difficult at all     Interpretation of Total Score  Total Score Depression Severity:  1-4 = Minimal depression, 5-9 = Mild depression, 10-14 = Moderate depression, 15-19 = Moderately severe depression, 20-27 = Severe depression   Psychosocial Evaluation and Intervention:   Psychosocial Re-Evaluation:   Psychosocial Discharge (Final Psychosocial Re-Evaluation):   Vocational Rehabilitation: Provide vocational rehab assistance to qualifying candidates.   Vocational Rehab Evaluation & Intervention: Vocational Rehab - 10/28/18 1526      Initial Vocational Rehab Evaluation & Intervention   Assessment shows need for Vocational Rehabilitation  No       Education: Education Goals: Education classes will be provided on a variety of topics geared toward better understanding of heart health and risk factor modification. Participant will state understanding/return demonstration of topics presented as noted by education test scores.  Learning Barriers/Preferences: Learning Barriers/Preferences - 10/28/18 1525      Learning Barriers/Preferences   Learning Barriers  None    Learning Preferences  None       Education Topics:  AED/CPR: - Group verbal and written instruction with the use of models to demonstrate the basic use of the AED with the basic ABC's of resuscitation.   General Nutrition Guidelines/Fats and Fiber: -Group instruction  provided by verbal, written material, models and posters to present the general guidelines for heart healthy nutrition. Gives an explanation and review of dietary fats and fiber.   Controlling  Sodium/Reading Food Labels: -Group verbal and written material supporting the discussion of sodium use in heart healthy nutrition. Review and explanation with models, verbal and written materials for utilization of the food label.   Exercise Physiology & General Exercise Guidelines: - Group verbal and written instruction with models to review the exercise physiology of the cardiovascular system and associated critical values. Provides general exercise guidelines with specific guidelines to those with heart or lung disease.    Aerobic Exercise & Resistance Training: - Gives group verbal and written instruction on the various components of exercise. Focuses on aerobic and resistive training programs and the benefits of this training and how to safely progress through these programs..   Flexibility, Balance, Mind/Body Relaxation: Provides group verbal/written instruction on the benefits of flexibility and balance training, including mind/body exercise modes such as yoga, pilates and tai chi.  Demonstration and skill practice provided.   Stress and Anxiety: - Provides group verbal and written instruction about the health risks of elevated stress and causes of high stress.  Discuss the correlation between heart/lung disease and anxiety and treatment options. Review healthy ways to manage with stress and anxiety.   Depression: - Provides group verbal and written instruction on the correlation between heart/lung disease and depressed mood, treatment options, and the stigmas associated with seeking treatment.   Anatomy & Physiology of the Heart: - Group verbal and written instruction and models provide basic cardiac anatomy and physiology, with the coronary electrical and arterial systems. Review of Valvular disease and Heart Failure   Cardiac Procedures: - Group verbal and written instruction to review commonly prescribed medications for heart disease. Reviews the medication, class of the drug,  and side effects. Includes the steps to properly store meds and maintain the prescription regimen. (beta blockers and nitrates)   Cardiac Medications I: - Group verbal and written instruction to review commonly prescribed medications for heart disease. Reviews the medication, class of the drug, and side effects. Includes the steps to properly store meds and maintain the prescription regimen.   Cardiac Medications II: -Group verbal and written instruction to review commonly prescribed medications for heart disease. Reviews the medication, class of the drug, and side effects. (all other drug classes)    Go Sex-Intimacy & Heart Disease, Get SMART - Goal Setting: - Group verbal and written instruction through game format to discuss heart disease and the return to sexual intimacy. Provides group verbal and written material to discuss and apply goal setting through the application of the S.M.A.R.T. Method.   Other Matters of the Heart: - Provides group verbal, written materials and models to describe Stable Angina and Peripheral Artery. Includes description of the disease process and treatment options available to the cardiac patient.   Exercise & Equipment Safety: - Individual verbal instruction and demonstration of equipment use and safety with use of the equipment.   Cardiac Rehab from 10/28/2018 in [facility] Cardiac and Pulmonary Rehab  Date  10/28/18  Educator  [facility]  Instruction Review Code  1- Verbalizes Understanding      Infection Prevention: - Provides verbal and written material to individual with discussion of infection control including proper hand washing and proper equipment cleaning during exercise session.   Cardiac Rehab from 10/28/2018 in [facility] Cardiac and Pulmonary Rehab  Date  10/28/18  Educator  [facility]  Instruction Review Code  1- Verbalizes Understanding      Falls Prevention: - Provides verbal and written material to individual with discussion of falls prevention and safety.    Cardiac Rehab from 10/28/2018 in [facility] Cardiac and Pulmonary Rehab  Date  10/28/18  Educator  [facility]  Instruction Review Code  1- Verbalizes Understanding      Diabetes: - Individual verbal and written instruction to review signs/symptoms of diabetes, desired ranges of glucose level fasting, after meals and with exercise. Acknowledge that pre and post exercise glucose checks will be done for 3 sessions at entry of program.   Cardiac Rehab from 10/28/2018 in [facility] Cardiac and Pulmonary Rehab  Date  10/28/18  Educator  [name]  Instruction Review Code  1- Verbalizes Understanding      Know Your Numbers and Risk Factors: -Group verbal and written instruction about important numbers in your health.  Discussion of what are risk factors and how they play a role in the disease process.  Review of Cholesterol, Blood Pressure, Diabetes, and BMI and the role they play in your overall health.   Sleep Hygiene: -Provides group verbal and written instruction about how sleep can affect your health.  Define sleep hygiene, discuss sleep cycles and impact of sleep habits. Review good sleep hygiene tips.    Other: -Provides group and verbal instruction on various topics (see comments)   Knowledge Questionnaire Score: Knowledge Questionnaire Score - 10/28/18 1526      Knowledge Questionnaire Score   Pre Score  26/26       Core Components/Risk Factors/Patient Goals at Admission: Personal Goals and Risk Factors at Admission - 10/28/18 1523      Core Components/Risk Factors/Patient Goals on Admission    Weight Management  Yes;Weight Maintenance    Intervention  Weight Management: Develop a combined nutrition and exercise program designed to reach desired caloric intake, while maintaining appropriate intake of nutrient and fiber, sodium and fats, and appropriate energy expenditure required for the weight goal.;Weight Management: Provide education and appropriate resources to help participant work on and attain  dietary goals.    Admit Weight  165 lb (74.8 kg)    Expected Outcomes  Short Term: Continue to assess and modify interventions until short term weight is achieved;Long Term: Adherence to nutrition and physical activity/exercise program aimed toward attainment of established weight goal;Weight Maintenance: Understanding of the daily nutrition guidelines, which includes 25-35% calories from fat, 7% or less cal from saturated fats, less than 232m cholesterol, less than 1.5gm of sodium, & 5 or more servings of fruits and vegetables daily;Understanding recommendations for meals to include 15-35% energy as protein, 25-35% energy from fat, 35-60% energy from carbohydrates, less than 205mof dietary cholesterol, 20-35 gm of total fiber daily;Understanding of distribution of calorie intake throughout the day with the consumption of 4-5 meals/snacks    Diabetes  Yes    Intervention  Provide education about signs/symptoms and action to take for hypo/hyperglycemia.;Provide education about proper nutrition, including hydration, and aerobic/resistive exercise prescription along with prescribed medications to achieve blood glucose in normal ranges: Fasting glucose 65-99 mg/dL    Expected Outcomes  Long Term: Attainment of HbA1C < 7%.    Hypertension  Yes    Intervention  Provide education on lifestyle modifcations including regular physical activity/exercise, weight management, moderate sodium restriction and increased consumption of fresh fruit, vegetables, and low fat dairy, alcohol moderation, and smoking cessation.;Monitor prescription use compliance.    Expected Outcomes  Short Term: Continued assessment and  intervention until BP is < 140/41m HG in hypertensive participants. < 130/826mHG in hypertensive participants with diabetes, heart failure or chronic kidney disease.;Long Term: Maintenance of blood pressure at goal levels.    Lipids  Yes    Intervention  Provide education and support for participant on  nutrition & aerobic/resistive exercise along with prescribed medications to achieve LDL <7019mHDL >76m86m  Expected Outcomes  Short Term: Participant states understanding of desired cholesterol values and is compliant with medications prescribed. Participant is following exercise prescription and nutrition guidelines.;Long Term: Cholesterol controlled with medications as prescribed, with individualized exercise RX and with personalized nutrition plan. Value goals: LDL < 70mg58mL > 40 mg.       Core Components/Risk Factors/Patient Goals Review:    Core Components/Risk Factors/Patient Goals at Discharge (Final Review):    ITP Comments: ITP Comments    Row Name 10/28/18 1507           ITP Comments  Med Review completed. Initial ITP created. Diagnosis can be found in Care Everywhere 11/12          Comments: Initial ITP

## 2018-10-29 NOTE — Progress Notes (Signed)
MARTINO, TOMPSON ([id number]) Visit Report for 10/27/2018 Chief Complaint Document Details Patient Name: KAVI, ALMQUIST. Date of Service: 10/27/2018 8:45 AM Medical Record Number: [id number] Patient Account Number: [id number] Date of Birth/Sex: 09-10-1946 (73 y.o. M) Treating RN: Montey Hora Primary Care Provider: Harrel Lemon Other Clinician: Referring Provider: Lujean Amel Treating Provider/Extender: Tito Dine in Treatment: 0 Information Obtained from: Patient Chief Complaint 10/27/2018; patient is here for review of postsurgical wounds on the left medial calf both proximal and distal Electronic Signature(s) Signed: 10/27/2018 5:09:58 PM By: Linton Ham MD Entered By: Linton Ham on 10/27/2018 10:16:27 [name] ([id number]) -------------------------------------------------------------------------------- Debridement Details Patient Name: AUSTINE, WIEDEMAN. Date of Service: 10/27/2018 8:45 AM Medical Record Number: [id number] Patient Account Number: [id number] Date of Birth/Sex: [birth year] (73 y.o. M) Treating RN: Montey Hora Primary Care Provider: Harrel Lemon Other Clinician: Referring Provider: Lujean Amel Treating Provider/Extender: Tito Dine in Treatment: 0 Debridement Performed for Wound #2 Left,Proximal,Medial Lower Leg Assessment: Performed By: Physician Ricard Dillon, MD Debridement Type: Debridement Severity of Tissue Pre Fat layer exposed Debridement: Level of Consciousness (Pre- Awake and Alert procedure): Pre-procedure Verification/Time Yes - 09:47 Out Taken: Start Time: 09:47 Pain Control: Lidocaine 4% Topical Solution Total Area Debrided (L x W): 0.4 (cm) x 1.1 (cm) = 0.44 (cm) Tissue and other material Viable, Non-Viable, Slough, Subcutaneous, Slough debrided: Level: Skin/Subcutaneous Tissue Debridement Description: Excisional Instrument: Curette Bleeding: Minimum Hemostasis Achieved: Pressure End  Time: 09:49 Procedural Pain: 0 Post Procedural Pain: 0 Response to Treatment: Procedure was tolerated well Level of Consciousness Awake and Alert (Post-procedure): Post Debridement Measurements of Total Wound Length: (cm) 0.4 Width: (cm) 1.1 Depth: (cm) 0.2 Volume: (cm) 0.069 Character of Wound/Ulcer Post Debridement: Improved Severity of Tissue Post Debridement: Fat layer exposed Post Procedure Diagnosis Same as Pre-procedure Electronic Signature(s) Signed: 10/27/2018 10:19:04 AM By: Montey Hora Signed: 10/27/2018 5:09:58 PM By: Linton Ham MD Entered By: Montey Hora on 10/27/2018 10:19:03 [name] ([id number]) -------------------------------------------------------------------------------- Debridement Details Patient Name: JESUSMANUEL, ERBES. Date of Service: 10/27/2018 8:45 AM Medical Record Number: [id number] Patient Account Number: [id number] Date of Birth/Sex: [birth year] (73 y.o. M) Treating RN: Montey Hora Primary Care Provider: Harrel Lemon Other Clinician: Referring Provider: Lujean Amel Treating Provider/Extender: Tito Dine in Treatment: 0 Debridement Performed for Wound #1 Left,Medial Lower Leg Assessment: Performed By: Physician Ricard Dillon, MD Debridement Type: Debridement Severity of Tissue Pre Fat layer exposed Debridement: Level of Consciousness (Pre- Awake and Alert procedure): Pre-procedure Verification/Time Yes - 09:43 Out Taken: Start Time: 09:43 Pain Control: Lidocaine 4% Topical Solution Total Area Debrided (L x W): 1.5 (cm) x 0.9 (cm) = 1.35 (cm) Tissue and other material Viable, Non-Viable, Slough, Subcutaneous, Slough, Other: staple debrided: Level: Skin/Subcutaneous Tissue Debridement Description: Excisional Instrument: Curette Bleeding: Minimum Hemostasis Achieved: Pressure End Time: 09:47 Procedural Pain: 0 Post Procedural Pain: 0 Response to Treatment: Procedure was tolerated well Level of  Consciousness Awake and Alert (Post-procedure): Post Debridement Measurements of Total Wound Length: (cm) 2.3 Width: (cm) 1.4 Depth: (cm) 0.3 Volume: (cm) 0.759 Character of Wound/Ulcer Post Debridement: Improved Severity of Tissue Post Debridement: Fat layer exposed Post Procedure Diagnosis Same as Pre-procedure Electronic Signature(s) Signed: 10/27/2018 10:20:09 AM By: Montey Hora Signed: 10/27/2018 5:09:58 PM By: Linton Ham MD Entered By: Montey Hora on 10/27/2018 10:20:09 [name] ([id number]) -------------------------------------------------------------------------------- HPI Details Patient Name: SHIGERU, LAMPERT. Date of Service: 10/27/2018 8:45 AM Medical Record Number: [id number] Patient Account Number: [id number] Date of Birth/Sex: December 07, 1945 (73  y.o. M) Treating RN: Montey Hora Primary Care Provider: Harrel Lemon Other Clinician: Referring Provider: Lujean Amel Treating Provider/Extender: Tito Dine in Treatment: 0 History of Present Illness HPI Description: ADMISSION 10/27/2018 This is a patient to is 73 year old type II diabetic on oral agents. In the fall of this year he had progressive difficulties with chest/epigastric discomfort. 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 He underwent a CABG x4 at [facility] on November 19. Among other things he had a left greater saphenous vein harvest. He had a complicated postop course. The area on his left leg was sutured but dehisced. The sutures have since been removed. He has been left with eschar over the top. He was apparently told to "leave this open to air". He has been washing it with soap and water. He did receive a course of doxycycline from his primary doctor in mid December he is finished that now. He does not have a known history of peripheral arterial disease. He does have a history of diabetic peripheral neuropathy. ABIs in this  clinic were 1.00 on the right and 1.09 on the left Past medical history includes CABG x4 at [facility] on November 14, hypertension, type 2 diabetes with peripheral neuropathy with a recent hemoglobin A1c of 7.3, cervical radicular pain, basal cell CA of the scalp. Electronic Signature(s) Signed: 10/27/2018 5:09:58 PM By: Linton Ham MD Entered By: Linton Ham on 10/27/2018 10:24:58 [name] ([id number]) -------------------------------------------------------------------------------- Physical Exam Details Patient Name: GILES, CURRIE. Date of Service: 10/27/2018 8:45 AM Medical Record Number: [id number] Patient Account Number: [id number] Date of Birth/Sex: [birth year] (73 y.o. M) Treating RN: Montey Hora Primary Care Provider: Harrel Lemon Other Clinician: Referring Provider: Lujean Amel Treating Provider/Extender: Tito Dine in Treatment: 0 Constitutional Patient is hypertensive.. Pulse regular and within target range for patient.Marland Kitchen Respirations regular, non-labored and within target range.. Temperature is normal and within the target range for the patient.Marland Kitchen appears in no distress. Eyes Conjunctivae clear. No discharge. Respiratory Respiratory effort is easy and symmetric bilaterally. Rate is normal at rest and on room air.. Bilateral breath sounds are clear and equal in all lobes with no wheezes, rales or rhonchi.. Cardiovascular Pedal pulses palpable and strong bilaterally.. Lymphatic None palpable in the popliteal or inguinal area on the left. Integumentary (Hair, Skin) No primary cutaneous issues. He does have some slight erythema around the open area on the distal left medial calf although this is nontender.Marland Kitchen Psychiatric No evidence of depression, anxiety, or agitation. Calm, cooperative, and communicative. Appropriate interactions and affect.. Notes Wound exam; the patient has 2 areas 1 proximal left medial and 1 distal. The distal is a large area  adherent eschar on a oval to circular shaped wound. This is already beginning to dehisce. Wound surface covered by necrotic debris. Using a #5 curette this was removed. Necrotic subcutaneous debris also removed. I got down to a reasonably healthy looking wound bed nice looking tissue. There was also a retained suture which I do not believe was adherent to anything. It was also removed. oMore proximally surface eschar and a scant amount of subcutaneous tissue removed with another #5 curette. oNo evidence of infection at either site there was some slight erythema around the distal wound although this was nontender I did not think this currently required antibiotics Electronic Signature(s) Signed: 10/27/2018 5:09:58 PM By: Linton Ham MD Entered By: Linton Ham on 10/27/2018 10:22:46 [name] ([id number]) -------------------------------------------------------------------------------- Physician Orders Details Patient  Name: DENSEL, KRONICK. Date of Service: 10/27/2018 8:45 AM Medical Record Number: [id number] Patient Account Number: [id number] Date of Birth/Sex: 1946/08/21 (73 y.o. M) Treating RN: Montey Hora Primary Care Provider: Harrel Lemon Other Clinician: Referring Provider: Lujean Amel Treating Provider/Extender: Tito Dine in Treatment: 0 Verbal / Phone Orders: No Diagnosis Coding ICD-10 Coding Code Description T81.31XD Disruption of external operation (surgical) wound, not elsewhere classified, subsequent encounter L97.222 Non-pressure chronic ulcer of left calf with fat layer exposed E11.622 Type 2 diabetes mellitus with other skin ulcer Wound Cleansing Wound #1 Left,Medial Lower Leg o Clean wound with Normal Saline. o May shower with protection. Wound #2 Left,Proximal,Medial Lower Leg o Clean wound with Normal Saline. o May shower with protection. Anesthetic (add to Medication List) Wound #1 Left,Medial Lower Leg o Topical Lidocaine  4% cream applied to wound bed prior to debridement (In Clinic Only). Wound #2 Left,Proximal,Medial Lower Leg o Topical Lidocaine 4% cream applied to wound bed prior to debridement (In Clinic Only). Primary Wound Dressing Wound #1 Left,Medial Lower Leg o Silver Collagen Wound #2 Left,Proximal,Medial Lower Leg o Silver Collagen Secondary Dressing Wound #1 Left,Medial Lower Leg o ABD pad Wound #2 Left,Proximal,Medial Lower Leg o ABD pad Dressing Change Frequency Wound #1 Left,Medial Lower Leg o Change dressing every week Wound #2 Left,Proximal,Medial Lower Leg o Change dressing every week CONAL, SHETLEY ([id number]) Follow-up Appointments Wound #1 Left,Medial Lower Leg o Return Appointment in 1 week. Wound #2 Left,Proximal,Medial Lower Leg o Return Appointment in 1 week. Edema Control Wound #1 Left,Medial Lower Leg o Kerlix and Coban - Left Lower Extremity Wound #2 Left,Proximal,Medial Lower Leg o Kerlix and Coban - Left Lower Extremity Electronic Signature(s) Signed: 10/27/2018 5:09:58 PM By: Linton Ham MD Signed: 10/27/2018 5:48:26 PM By: Montey Hora Entered By: Montey Hora on 10/27/2018 09:52:22 GAVAN, NORDBY ([id number]) -------------------------------------------------------------------------------- Problem List Details Patient Name: TRONG, GOSLING. Date of Service: 10/27/2018 8:45 AM Medical Record Number: [id number] Patient Account Number: [id number] Date of Birth/Sex: 06/29/1946 (73 y.o. M) Treating RN: Montey Hora Primary Care Provider: Harrel Lemon Other Clinician: Referring Provider: Lujean Amel Treating Provider/Extender: Tito Dine in Treatment: 0 Active Problems ICD-10 Evaluated Encounter Code Description Active Date Today Diagnosis T81.31XD Disruption of external operation (surgical) wound, not 10/27/2018 No Yes elsewhere classified, subsequent encounter L97.222 Non-pressure chronic ulcer of left calf with  fat layer exposed 10/27/2018 No Yes E11.622 Type 2 diabetes mellitus with other skin ulcer 10/27/2018 No Yes Inactive Problems Resolved Problems Electronic Signature(s) Signed: 10/27/2018 5:09:58 PM By: Linton Ham MD Entered By: Linton Ham on 10/27/2018 09:13:03 [name] ([id number]) -------------------------------------------------------------------------------- Progress Note Details Patient Name: [name]. Date of Service: 10/27/2018 8:45 AM Medical Record Number: [id number] Patient Account Number: [id number] Date of Birth/Sex: 29-Sep-1946 (73 y.o. M) Treating RN: Montey Hora Primary Care Provider: Harrel Lemon Other Clinician: Referring Provider: Lujean Amel Treating Provider/Extender: Tito Dine in Treatment: 0 Subjective Chief Complaint Information obtained from Patient 10/27/2018; patient is here for review of postsurgical wounds on the left medial calf both proximal and distal History of Present Illness (HPI) ADMISSION 10/27/2018 This is a patient to is 73 year old type II diabetic on oral agents. In the fall of this year he had progressive difficulties with chest/epigastric discomfort. 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 He underwent a CABG x4 at [facility] on November 19. Among other things he had a left greater saphenous vein harvest. He had a complicated  postop course. The area on his left leg was sutured but dehisced. The sutures have since been removed. He has been left with eschar over the top. He was apparently told to "leave this open to air". He has been washing it with soap and water. He did receive a course of doxycycline from his primary doctor in mid December he is finished that now. He does not have a known history of peripheral arterial disease. He does have a history of diabetic peripheral neuropathy. ABIs in this clinic were 1.00 on the right and 1.09 on the left Past  medical history includes CABG x4 at [facility] on November 14, hypertension, type 2 diabetes with peripheral neuropathy with a recent hemoglobin A1c of 7.3, cervical radicular pain, basal cell CA of the scalp. Wound History Patient presents with 2 open wounds that have been present for approximately 2 months. Patient has been treating wounds in the following manner: soap and water, antibiotics. Laboratory tests have not been performed in the last month. Patient reportedly has not tested positive for an antibiotic resistant organism. Patient reportedly has not tested positive for osteomyelitis. Patient reportedly has not had testing performed to evaluate circulation in the legs. Patient experiences the following problems associated with their wounds: swelling. Patient History Information obtained from Patient. Allergies No Known Drug Allergies Family History Cancer - Mother, Heart Disease - Father, Hypertension - Maternal Grandparents,Paternal Grandparents,Mother,Father, No family history of Diabetes, Hereditary Spherocytosis, Kidney Disease, Lung Disease, Stroke, Thyroid Problems, Tuberculosis. Social History Never smoker, Alcohol Use - Moderate, Caffeine Use - Daily - coffee. Medical History Hematologic/Lymphatic MIGUELANGEL, KORN ([id number]) Denies history of Anemia Cardiovascular Patient has history of Coronary Artery Disease, Hypertension Endocrine Patient has history of 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 Integumentary (Skin) Denies history of History of Burn, History of pressure wounds Musculoskeletal Patient has history of Gout Oncologic Denies history of Received Chemotherapy, Received Radiation Patient is treated with Oral Agents. Blood sugar is tested. Blood sugar results noted at the following times: Breakfast - 101. Medical And Surgical History Notes Hematologic/Lymphatic Hematuria Gastrointestinal Reflux Review of Systems (ROS) Ear/Nose/Mouth/Throat The patient has no complaints  or symptoms. Hematologic/Lymphatic The patient has no complaints or symptoms, Thrombocytopenia Respiratory The patient has no complaints or symptoms. Cardiovascular The patient has no complaints or symptoms. Gastrointestinal The patient has no complaints or symptoms. Endocrine The patient has no complaints or symptoms. Genitourinary The patient has no complaints or symptoms. Immunological The patient has no complaints or symptoms. Integumentary (Skin) Complains or has symptoms of Wounds - 2 due to surgery. Musculoskeletal The patient has no complaints or symptoms. Neurologic The patient has no complaints or symptoms. Oncologic The patient has no complaints or symptoms. Psychiatric The patient has no complaints or symptoms. Objective Constitutional REA, RESER ([id number]) Patient is hypertensive.. Pulse regular and within target range for patient.Marland Kitchen Respirations regular, non-labored and within target range.. Temperature is normal and within the target range for the patient.Marland Kitchen appears in no distress. Vitals Time Taken: 8:51 AM, Height: 69 in, Source: Stated, Weight: 166 lbs, Source: Measured, BMI: 24.5, Temperature: 97.9 F, Pulse: 61 bpm, Respiratory Rate: 16 breaths/min, Blood Pressure: 145/47 mmHg. Eyes Conjunctivae clear. No discharge. Respiratory Respiratory effort is easy and symmetric bilaterally. Rate is normal at rest and on room air.. Bilateral breath sounds are clear and equal in all lobes with no wheezes, rales or rhonchi.. Cardiovascular Pedal pulses palpable and strong bilaterally.. Lymphatic None palpable in the popliteal or inguinal area on the left. Psychiatric No  evidence of depression, anxiety, or agitation. Calm, cooperative, and communicative. Appropriate interactions and affect.. General Notes: Wound exam; the patient has 2 areas 1 proximal left medial and 1 distal. The distal is a large area adherent eschar on a oval to circular shaped wound. This is  already beginning to dehisce. Wound surface covered by necrotic debris. Using a #5 curette this was removed. Necrotic subcutaneous debris also removed. I got down to a reasonably healthy looking wound bed nice looking tissue. There was also a retained suture which I do not believe was adherent to anything. It was also removed. More proximally surface eschar and a scant amount of subcutaneous tissue removed with another #5 curette. No evidence of infection at either site there was some slight erythema around the distal wound although this was nontender I did not think this currently required antibiotics Integumentary (Hair, Skin) No primary cutaneous issues. He does have some slight erythema around the open area on the distal left medial calf although this is nontender.. Wound #1 status is Open. Original cause of wound was Surgical Injury. The wound is located on the Left,Medial Lower Leg. The wound measures 1.5cm length x 0.9cm width x 0.4cm depth; 1.06cm^2 area and 0.424cm^3 volume. There is Fat Layer (Subcutaneous Tissue) Exposed exposed. There is no tunneling or undermining noted. There is a small amount of serous drainage noted. The wound margin is flat and intact. There is small (1-33%) pale granulation within the wound bed. There is a large (67-100%) amount of necrotic tissue within the wound bed including Eschar and Adherent Slough. The periwound skin appearance exhibited: Dry/Scaly, Erythema. The periwound skin appearance did not exhibit: Callus, Crepitus, Excoriation, Induration, Rash, Scarring, Maceration, Atrophie Blanche, Cyanosis, Ecchymosis, Hemosiderin Staining, Mottled, Pallor, Rubor. The surrounding wound skin color is noted with erythema which is circumferential. Periwound temperature was noted as No Abnormality. The periwound has tenderness on palpation. Wound #2 status is Open. Original cause of wound was Surgical Injury. The wound is located on the Left,Proximal,Medial Lower  Leg. The wound measures 1.1cm length x 0.4cm width x 0.1cm depth; 0.346cm^2 area and 0.035cm^3 volume. There is Fat Layer (Subcutaneous Tissue) Exposed exposed. There is no tunneling or undermining noted. There is a medium amount of serous drainage noted. The wound margin is flat and intact. There is large (67-100%) pink granulation within the wound bed. There is no necrotic tissue within the wound bed. The periwound skin appearance did not exhibit: Callus, Crepitus, Excoriation, Induration, Rash, Scarring, Dry/Scaly, Maceration, Atrophie Blanche, Cyanosis, Ecchymosis, Hemosiderin Staining, Mottled, Pallor, Rubor, Erythema. Periwound temperature was noted as No Abnormality. The periwound has tenderness on palpation. MISHAWN, HEMANN ([id number]) Assessment Active Problems ICD-10 Disruption of external operation (surgical) wound, not elsewhere classified, subsequent encounter Non-pressure chronic ulcer of left calf with fat layer exposed Type 2 diabetes mellitus with other skin ulcer Procedures Wound #1 Pre-procedure diagnosis of Wound #1 is a Diabetic Wound/Ulcer of the Lower Extremity located on the Left,Medial Lower Leg .Severity of Tissue Pre Debridement is: Fat layer exposed. There was a Excisional Skin/Subcutaneous Tissue Debridement with a total area of 1.35 sq cm performed by Ricard Dillon, MD. With the following instrument(s): Curette to remove Viable and Non-Viable tissue/material. Material removed includes Subcutaneous Tissue, Slough, and Other: staple after achieving pain control using Lidocaine 4% Topical Solution. No specimens were taken. A time out was conducted at 09:43, prior to the start of the procedure. A Minimum amount of bleeding was controlled with Pressure. The procedure was tolerated well  with a pain level of 0 throughout and a pain level of 0 following the procedure. Post Debridement Measurements: 2.3cm length x 1.4cm width x 0.3cm depth; 0.759cm^3 volume. Character  of Wound/Ulcer Post Debridement is improved. Severity of Tissue Post Debridement is: Fat layer exposed. Post procedure Diagnosis Wound #1: Same as Pre-Procedure Wound #2 Pre-procedure diagnosis of Wound #2 is a Diabetic Wound/Ulcer of the Lower Extremity located on the Left,Proximal,Medial Lower Leg .Severity of Tissue Pre Debridement is: Fat layer exposed. There was a Excisional Skin/Subcutaneous Tissue Debridement with a total area of 0.44 sq cm performed by Ricard Dillon, MD. With the following instrument(s): Curette to remove Viable and Non-Viable tissue/material. Material removed includes Subcutaneous Tissue and Slough and after achieving pain control using Lidocaine 4% Topical Solution. No specimens were taken. A time out was conducted at 09:47, prior to the start of the procedure. A Minimum amount of bleeding was controlled with Pressure. The procedure was tolerated well with a pain level of 0 throughout and a pain level of 0 following the procedure. Post Debridement Measurements: 0.4cm length x 1.1cm width x 0.2cm depth; 0.069cm^3 volume. Character of Wound/Ulcer Post Debridement is improved. Severity of Tissue Post Debridement is: Fat layer exposed. Post procedure Diagnosis Wound #2: Same as Pre-Procedure Plan Wound Cleansing: Wound #1 Left,Medial Lower Leg: Clean wound with Normal Saline. May shower with protection. Wound #2 Left,Proximal,Medial Lower Leg: Clean wound with Normal Saline. May shower with protection. Anesthetic (add to Medication List): Wound #1 Left,Medial Lower Leg: Topical Lidocaine 4% cream applied to wound bed prior to debridement (In Clinic Only). CLEVON, KHADER ([id number]) Wound #2 Left,Proximal,Medial Lower Leg: Topical Lidocaine 4% cream applied to wound bed prior to debridement (In Clinic Only). Primary Wound Dressing: Wound #1 Left,Medial Lower Leg: Silver Collagen Wound #2 Left,Proximal,Medial Lower Leg: Silver Collagen Secondary  Dressing: Wound #1 Left,Medial Lower Leg: ABD pad Wound #2 Left,Proximal,Medial Lower Leg: ABD pad Dressing Change Frequency: Wound #1 Left,Medial Lower Leg: Change dressing every week Wound #2 Left,Proximal,Medial Lower Leg: Change dressing every week Follow-up Appointments: Wound #1 Left,Medial Lower Leg: Return Appointment in 1 week. Wound #2 Left,Proximal,Medial Lower Leg: Return Appointment in 1 week. Edema Control: Wound #1 Left,Medial Lower Leg: Kerlix and Coban - Left Lower Extremity Wound #2 Left,Proximal,Medial Lower Leg: Kerlix and Coban - Left Lower Extremity 1. The debridement was necessary, fortunately surface looks reasonable. We use silver collagen moistened with saline and hydrogel. There is some swelling around the areas I elected to put this in Kerlix co-band. We will attempt to leave this on all week. His significant other who is here could possibly be taught to do this. Border foam would be the alternative however I wanted to put this in some compression. 2. He does not have significant peripheral arterial disease his ABIs in this clinic as well as clinical exam were normal 3. No evidence of infection no cultures or antibiotics were felt to be necessary Electronic Signature(s) Signed: 10/27/2018 10:25:44 AM By: Linton Ham MD Entered By: Linton Ham on 10/27/2018 10:25:44 [name] ([id number]) -------------------------------------------------------------------------------- ROS/PFSH Details Patient Name: LAMONTA, CYPRESS. Date of Service: 10/27/2018 8:45 AM Medical Record Number: [id number] Patient Account Number: [id number] Date of Birth/Sex: 1946-03-18 (73 y.o. M) Treating RN: Harold Barban Primary Care Provider: Harrel Lemon Other Clinician: Referring Provider: Lujean Amel Treating Provider/Extender: Tito Dine in Treatment: 0 Information Obtained From Patient Wound History Do you currently have one or more open woundso  Yes How many open wounds do  you currently haveo 2 Approximately how long have you had your woundso 2 months How have you been treating your wound(s) until nowo soap and water, antibiotics Has your wound(s) ever healed and then re-openedo No Have you had any lab work done in the past montho No Have you tested positive for an antibiotic resistant organism (MRSA, VRE)o No Have you tested positive for osteomyelitis (bone infection)o No Have you had any tests for circulation on your legso No Have you had other problems associated with your woundso Swelling Integumentary (Skin) Complaints and Symptoms: Positive for: Wounds - 2 due to surgery Medical History: Negative for: History of Burn; History of pressure wounds Ear/Nose/Mouth/Throat Complaints and Symptoms: No Complaints or Symptoms Hematologic/Lymphatic Complaints and Symptoms: No Complaints or Symptoms Complaints and Symptoms: Review of System Notes: Thrombocytopenia Medical History: Negative for: Anemia Past Medical History Notes: Hematuria Respiratory Complaints and Symptoms: No Complaints or Symptoms Cardiovascular ZADIEL, LEYH ([id number]) Complaints and Symptoms: No Complaints or Symptoms Medical History: Positive for: Coronary Artery Disease; Hypertension Gastrointestinal Complaints and Symptoms: No Complaints or Symptoms Medical History: Past Medical History Notes: Reflux Endocrine Complaints and Symptoms: No Complaints or Symptoms Medical History: Positive for: 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 Time with diabetes: 15 years Treated with: Oral agents Blood sugar tested every day: Yes Tested : daily Blood sugar testing results: Breakfast: 101 Genitourinary Complaints and Symptoms: No Complaints or Symptoms Immunological Complaints and Symptoms: No Complaints or Symptoms Musculoskeletal Complaints and Symptoms: No Complaints or Symptoms Medical History: Positive for: Gout Neurologic Complaints and  Symptoms: No Complaints or Symptoms Oncologic Complaints and Symptoms: No Complaints or Symptoms Medical History: Negative for: Received Chemotherapy; Received Radiation AMARIYON, MAYNES ([id number]) Psychiatric Complaints and Symptoms: No Complaints or Symptoms Immunizations Pneumococcal Vaccine: Received Pneumococcal Vaccination: Yes Implantable Devices Family and Social History Cancer: Yes - Mother; Diabetes: No; Heart Disease: Yes - Father; Hereditary Spherocytosis: No; Hypertension: Yes - Maternal Grandparents,Paternal Grandparents,Mother,Father; Kidney Disease: No; Lung Disease: No; Stroke: No; Thyroid Problems: No; Tuberculosis: No; Never smoker; Alcohol Use: Moderate; Caffeine Use: Daily - coffee; Financial Concerns: No; Food, Clothing or Shelter Needs: No; Support System Lacking: No; Transportation Concerns: No; Advanced Directives: Yes (Not Provided); Patient does not want information on Advanced Directives; Living Will: Yes (Not Provided); Medical Power of Attorney: Yes (Not Provided) Electronic Signature(s) Signed: 10/27/2018 5:09:58 PM By: Linton Ham MD Signed: 10/28/2018 2:41:13 PM By: Harold Barban Entered By: Harold Barban on 10/27/2018 09:15:54

## 2018-10-29 NOTE — Progress Notes (Signed)
Rickey Sherman, Rickey Sherman ([id number]) Visit Report for 10/27/2018 Allergy List Details Patient Name: Rickey Sherman, Rickey Sherman. Date of Service: 10/27/2018 8:45 AM Medical Record Number: [id number] Patient Account Number: [id number] Date of Birth/Sex: 03-27-1946 (73 y.o. M) Treating RN: Rickey Sherman Primary Care Rickey Sherman: Rickey Sherman Other Clinician: Referring Rickey Sherman: Rickey Sherman Treating Rickey Sherman/Extender: Rickey Sherman Weeks in Treatment: 0 Allergies Active Allergies No Known Drug Allergies Allergy Notes Electronic Signature(s) Signed: 10/28/2018 2:41:13 PM By: Rickey Sherman Entered By: Rickey Sherman on 10/27/2018 08:59:47 Rickey Sherman ([id number]) -------------------------------------------------------------------------------- Arrival Information Details Patient Name: Rickey Sherman, Rickey Sherman. Date of Service: 10/27/2018 8:45 AM Medical Record Number: [id number] Patient Account Number: [id number] Date of Birth/Sex: 08/19/1946 (73 y.o. M) Treating RN: Rickey Sherman Primary Care Amarius Toto: Rickey Sherman Other Clinician: Referring Rickey Sherman: Rickey Sherman Treating Rickey Sherman/Extender: Rickey Sherman in Treatment: 0 Visit Information Patient Arrived: Ambulatory Arrival Time: 08:50 Accompanied By: Rickey Sherman Transfer Assistance: None Patient Identification Verified: Yes Secondary Verification Process Completed: Yes Electronic Signature(s) Signed: 10/27/2018 9:46:02 AM By: Rickey Sherman Rickey Sherman Entered By: Rickey Sherman on 10/27/2018 08:51:16 Rickey Sherman ([id number]) -------------------------------------------------------------------------------- Clinic Level of Care Assessment Details Patient Name: Rickey Sherman, Rickey Sherman. Date of Service: 10/27/2018 8:45 AM Medical Record Number: [id number] Patient Account Number: [id number] Date of Birth/Sex: 11-Dec-1945 (73 y.o. M) Treating RN: Rickey Sherman Primary Care Shelle Galdamez: Rickey Sherman Other  Clinician: Referring Rickey Sherman: Rickey Sherman Treating Rickey Sherman/Extender: Rickey Sherman in Treatment: 0 Clinic Level of Care Assessment Items TOOL 1 Quantity Score []  - Use when EandM and Procedure is performed on INITIAL visit 0 ASSESSMENTS - Nursing Assessment / Reassessment X - General Physical Exam (combine w/ comprehensive assessment (listed just below) when 1 20 performed on new pt. evals) X- 1 25 Comprehensive Assessment (HX, ROS, Risk Assessments, Wounds Hx, etc.) ASSESSMENTS - Wound and Skin Assessment / Reassessment []  - Dermatologic / Skin Assessment (not related to wound area) 0 ASSESSMENTS - Ostomy and/or Continence Assessment and Care []  - Incontinence Assessment and Management 0 []  - 0 Ostomy Care Assessment and Management (repouching, etc.) PROCESS - Coordination of Care X - Simple Patient / Family Education for ongoing care 1 15 []  - 0 Complex (extensive) Patient / Family Education for ongoing care X- 1 10 Staff obtains Programmer, systems, Records, Test Results / Process Orders []  - 0 Staff telephones HHA, Nursing Homes / Clarify orders / etc []  - 0 Routine Transfer to another Facility (non-emergent condition) []  - 0 Routine Hospital Admission (non-emergent condition) X- 1 15 New Admissions / Biomedical engineer / Ordering NPWT, Apligraf, etc. []  - 0 Emergency Hospital Admission (emergent condition) PROCESS - Special Needs []  - Pediatric / Minor Patient Management 0 []  - 0 Isolation Patient Management []  - 0 Hearing / Language / Visual special needs []  - 0 Assessment of Community assistance (transportation, D/C planning, etc.) []  - 0 Additional assistance / Altered mentation []  - 0 Support Surface(s) Assessment (bed, cushion, seat, etc.) [name], [name] ([id number]) INTERVENTIONS - Miscellaneous []  - External ear exam 0 []  - 0 Patient Transfer (multiple staff / Civil Service fast streamer / Similar devices) []  - 0 Simple Staple / Suture removal (25 or  less) []  - 0 Complex Staple / Suture removal (26 or more) []  - 0 Hypo/Hyperglycemic Management (do not check if billed separately) X- 1 15 Ankle / Brachial Index (ABI) - do not check if billed separately Has the patient been seen at the hospital within the last three years: Yes Total Score: 100 Level  Of Care: New/Established - Level 3 Electronic Signature(s) Signed: 10/27/2018 5:48:26 PM By: Rickey Sherman Entered By: Rickey Sherman on 10/27/2018 09:52:41 Rickey Sherman, Rickey Sherman ([id number]) -------------------------------------------------------------------------------- Encounter Discharge Information Details Patient Name: Rickey Sherman, Rickey Sherman. Date of Service: 10/27/2018 8:45 AM Medical Record Number: [id number] Patient Account Number: [id number] Date of Birth/Sex: [birth year] (73 y.o. M) Treating RN: Rickey Sherman Primary Care Shaila Gilchrest: Rickey Sherman Other Clinician: Referring Tunisha Ruland: Rickey Sherman Treating Dorean Hiebert/Extender: Rickey Sherman in Treatment: 0 Encounter Discharge Information Items Post Procedure Vitals Discharge Condition: Stable Temperature (F): 97.9 Ambulatory Status: Ambulatory Pulse (bpm): 61 Discharge Destination: Home Respiratory Rate (breaths/min): 16 Transportation: Private Auto Blood Pressure (mmHg): 145/47 Accompanied By: spouse Schedule Follow-up Appointment: Yes Clinical Summary of Care: Electronic Signature(s) Signed: 10/27/2018 5:48:26 PM By: Rickey Sherman Entered By: Rickey Sherman on 10/27/2018 09:53:39 Rickey Sherman ([id number]) -------------------------------------------------------------------------------- Lower Extremity Assessment Details Patient Name: Rickey Sherman. Date of Service: 10/27/2018 8:45 AM Medical Record Number: [id number] Patient Account Number: [id number] Date of Birth/Sex: Nov 29, 1945 (73 y.o. M) Treating RN: Rickey Sherman Primary Care Yalonda Sample: Rickey Sherman Other Clinician: Referring Mesiah Manzo: Rickey Sherman Treating  Leland Raver/Extender: Rickey Sherman in Treatment: 0 Edema Assessment Assessed: [Left: No] [Right: No] [Left: Edema] [Right: :] Calf Left: Right: Point of Measurement: 33 cm From Medial Instep 34.5 cm 34 cm Ankle Left: Right: Point of Measurement: 11 cm From Medial Instep 21.2 cm 22 cm Vascular Assessment Pulses: Dorsalis Pedis Palpable: [Left:Yes] [Right:Yes] Posterior Tibial Palpable: [Left:Yes] [Right:Yes] Extremity colors, hair growth, and conditions: Extremity Color: [Left:Normal] [Right:Normal] Hair Growth on Extremity: [Left:Yes] [Right:Yes] Temperature of Extremity: [Left:Warm] [Right:Warm] Capillary Refill: [Left:< 3 seconds] [Right:< 3 seconds] Blood Pressure: Brachial: [Left:145] [Right:145] Dorsalis Pedis: 140 [Left:Dorsalis Pedis: 025] Ankle: Posterior Tibial: 158 [Left:Posterior Tibial: 145 1.09] [Right:1.00] Toe Nail Assessment Left: Right: Thick: No No Discolored: No No Deformed: No No Improper Length and Hygiene: No No Electronic Signature(s) Signed: 10/28/2018 2:41:13 PM By: Rickey Sherman Entered By: Rickey Sherman on 10/27/2018 09:31:03 Rickey Sherman ([id number]) -------------------------------------------------------------------------------- Multi Wound Chart Details Patient Name: Rickey Sherman. Date of Service: 10/27/2018 8:45 AM Medical Record Number: [id number] Patient Account Number: [id number] Date of Birth/Sex: 12-20-1945 (73 y.o. M) Treating RN: Rickey Sherman Primary Care Romello Hoehn: Rickey Sherman Other Clinician: Referring Kahner Yanik: Rickey Sherman Treating Windle Huebert/Extender: Rickey Sherman in Treatment: 0 Vital Signs Height(in): 69 Pulse(bpm): 45 Weight(lbs): 166 Blood Pressure(mmHg): 145/47 Body Mass Index(BMI): 25 Temperature(F): 97.9 Respiratory Rate 16 (breaths/min): Photos: [1:No Photos] [2:No Photos] [N/A:N/A] Wound Location: [1:Left Lower Leg - Medial] [2:Left Lower Leg - Medial, Proximal]  [N/A:N/A] Wounding Event: [1:Surgical Injury] [2:Surgical Injury] [N/A:N/A] Primary Etiology: [1:Diabetic Wound/Ulcer of the Lower Extremity] [2:Diabetic Wound/Ulcer of the Lower Extremity] [N/A:N/A] Secondary Etiology: [1:N/A] [2:Open Surgical Wound] [N/A:N/A] Comorbid History: [1:Coronary Artery Disease, Hypertension, Type II Diabetes, Gout] [2:Coronary Artery Disease, Hypertension, Type II Diabetes, Gout] [N/A:N/A] Date Acquired: [1:08/31/2018] [2:08/31/2018] [N/A:N/A] Weeks of Treatment: [1:0] [2:0] [N/A:N/A] Wound Status: [1:Open] [2:Open] [N/A:N/A] Measurements L x W x D [1:1.5x0.9x0.4] [2:0.1x0.1x0.1] [N/A:N/A] (cm) Area (cm) : [1:1.06] [2:0.008] [N/A:N/A] Volume (cm) : [1:0.424] [2:0.001] [N/A:N/A] % Reduction in Area: [1:0.00%] [2:N/A] [N/A:N/A] % Reduction in Volume: [1:0.00%] [2:N/A] [N/A:N/A] Classification: [1:Grade 2] [2:Grade 1] [N/A:N/A] Exudate Amount: [1:Small] [2:Medium] [N/A:N/A] Exudate Type: [1:Serous] [2:Serous] [N/A:N/A] Exudate Color: [1:amber] [2:amber] [N/A:N/A] Wound Margin: [1:Flat and Intact] [2:Flat and Intact] [N/A:N/A] Granulation Amount: [1:Small (1-33%)] [2:Large (67-100%)] [N/A:N/A] Granulation Quality: [1:Pale] [2:Pink] [N/A:N/A] Necrotic Amount: [1:Large (67-100%)] [2:None Present (0%)] [N/A:N/A] Necrotic Tissue: [1:Eschar, Adherent Slough] [2:N/A] [N/A:N/A] Exposed Structures: [  1:Fat Layer (Subcutaneous Tissue) Exposed: Yes Fascia: No Tendon: No Muscle: No Joint: No Bone: No] [2:Fat Layer (Subcutaneous Tissue) Exposed: Yes Fascia: No Tendon: No Muscle: No Joint: No Bone: No] [N/A:N/A] Epithelialization: [1:None] [2:Small (1-33%)] [N/A:N/A] Debridement: [1:Debridement - Excisional] [2:Debridement - Excisional] [N/A:N/A] Pre-procedure 09:43 09:47 N/A Verification/Time Out Taken: Pain Control: Lidocaine 4% Topical Solution Lidocaine 4% Topical Solution N/A Tissue Debrided: Subcutaneous, Slough Subcutaneous, Slough N/A Level: Skin/Subcutaneous  Tissue Skin/Subcutaneous Tissue N/A Debridement Area (sq cm): 1.35 0.44 N/A Instrument: Curette Curette N/A Bleeding: Minimum Minimum N/A Hemostasis Achieved: Pressure Pressure N/A Procedural Pain: 0 0 N/A Post Procedural Pain: 0 0 N/A Debridement Treatment Procedure was tolerated well Procedure was tolerated well N/A Response: Post Debridement 2.3x1.4x0.3 0.4x1.1x0.2 N/A Measurements L x W x D (cm) Post Debridement Volume: 0.759 0.069 N/A (cm) Periwound Skin Texture: Excoriation: No Excoriation: No N/A Induration: No Induration: No Callus: No Callus: No Crepitus: No Crepitus: No Rash: No Rash: No Scarring: No Scarring: No Periwound Skin Moisture: Dry/Scaly: Yes Maceration: No N/A Maceration: No Dry/Scaly: No Periwound Skin Color: Erythema: Yes Atrophie Blanche: No N/A Atrophie Blanche: No Cyanosis: No Cyanosis: No Ecchymosis: No Ecchymosis: No Erythema: No Hemosiderin Staining: No Hemosiderin Staining: No Mottled: No Mottled: No Pallor: No Pallor: No Rubor: No Rubor: No Erythema Location: Circumferential N/A N/A Temperature: No Abnormality No Abnormality N/A Tenderness on Palpation: Yes Yes N/A Wound Preparation: Ulcer Cleansing: Ulcer Cleansing: N/A Rinsed/Irrigated with Saline Rinsed/Irrigated with Saline Topical Anesthetic Applied: Topical Anesthetic Applied: Other: lidocaine 4% Other: lidocaine 4% Procedures Performed: Debridement N/A N/A Treatment Notes Wound #1 (Left, Medial Lower Leg) Notes prisma, abd, kerlix/coban wrap with unna to anchor Wound #2 (Left, Proximal, Medial Lower Leg) Notes prisma, abd, kerlix/coban wrap with unna to anchor Rickey Sherman, Rickey Sherman ([id number]) Electronic Signature(s) Signed: 10/27/2018 5:09:58 PM By: Linton Ham MD Entered By: Linton Ham on 10/27/2018 10:13:42 Rickey Sherman ([id number]) -------------------------------------------------------------------------------- [place]  Details Patient Name: Rickey Sherman, Rickey Sherman. Date of Service: 10/27/2018 8:45 AM Medical Record Number: [id number] Patient Account Number: [id number] Date of Birth/Sex: Aug 09, 1946 (73 y.o. M) Treating RN: Rickey Sherman Primary Care Tae Vonada: Rickey Sherman Other Clinician: Referring Jhoana Upham: Rickey Sherman Treating Alyxis Grippi/Extender: Rickey Sherman in Treatment: 0 Active Inactive Abuse / Safety / Falls / Self Care Management Nursing Diagnoses: Potential for falls Goals: Patient will not experience any injury related to falls Date Initiated: 10/27/2018 Target Resolution Date: 01/22/2019 Goal Status: Active Interventions: Assess fall risk on admission and as needed Notes: Orientation to the Wound Care Program Nursing Diagnoses: Knowledge deficit related to the wound healing center program Goals: Patient/caregiver will verbalize understanding of the [facility] Program Date Initiated: 10/27/2018 Target Resolution Date: 01/22/2019 Goal Status: Active Interventions: Provide education on orientation to the wound center Notes: Wound/Skin Impairment Nursing Diagnoses: Impaired tissue integrity Goals: Ulcer/skin breakdown will heal within 14 weeks Date Initiated: 10/27/2018 Target Resolution Date: 01/22/2019 Goal Status: Active Interventions: Assess patient/caregiver ability to obtain necessary supplies Rickey Sherman, Rickey Sherman ([id number]) Assess patient/caregiver ability to perform ulcer/skin care regimen upon admission and as needed Assess ulceration(s) every visit Notes: Electronic Signature(s) Signed: 10/27/2018 5:48:26 PM By: Rickey Sherman Entered By: Rickey Sherman on 10/27/2018 09:41:06 Rickey Sherman ([id number]) -------------------------------------------------------------------------------- Pain Assessment Details Patient Name: Rickey Sherman. Date of Service: 10/27/2018 8:45 AM Medical Record Number: [id number] Patient Account Number: [id number] Date of Birth/Sex: 1946-04-16  (73 y.o. M) Treating RN: Rickey Sherman Primary Care Oaklee Sunga: Rickey Sherman Other Clinician: Referring Marvelous Woolford: Rickey Sherman Treating Rita Prom/Extender: Linton Ham  G Weeks in Treatment: 0 Active Problems Location of Pain Severity and Description of Pain Patient Has Paino Yes Site Locations Rate the pain. Current Pain Level: 2 Pain Management and Medication Current Pain Management: Electronic Signature(s) Signed: 10/27/2018 9:46:02 AM By: Rickey Sherman Rickey Sherman Signed: 10/27/2018 5:48:26 PM By: Rickey Sherman Entered By: Rickey Sherman on 10/27/2018 08:51:36 Rickey Sherman ([id number]) -------------------------------------------------------------------------------- Patient/Caregiver Education Details Patient Name: Rickey Sherman, Rickey Sherman. Date of Service: 10/27/2018 8:45 AM Medical Record Number: [id number] Patient Account Number: [id number] Date of Birth/Gender: [birth year] (73 y.o. M) Treating RN: Rickey Sherman Primary Care Physician: Rickey Sherman Other Clinician: Referring Physician: Lujean Sherman Treating Physician/Extender: Rickey Sherman in Treatment: 0 Education Assessment Education Provided To: Patient Education Topics Provided Venous: Handouts: Other: purpose of compression Methods: Explain/Verbal Responses: State content correctly Electronic Signature(s) Signed: 10/27/2018 5:48:26 PM By: Rickey Sherman Entered By: Rickey Sherman on 10/27/2018 09:54:00 Rickey Sherman ([id number]) -------------------------------------------------------------------------------- Wound Assessment Details Patient Name: Rickey Sherman, Rickey Sherman. Date of Service: 10/27/2018 8:45 AM Medical Record Number: [id number] Patient Account Number: [id number] Date of Birth/Sex: [birth year] (73 y.o. M) Treating RN: Rickey Sherman Primary Care Jayesh Marbach: Rickey Sherman Other Clinician: Referring Devesh Monforte: Rickey Sherman Treating Arilyn Brierley/Extender: Rickey Sherman in Treatment: 0 Wound Status Wound Number: 1 Primary Diabetic Wound/Ulcer of the Lower Extremity Etiology: Wound Location: Left Lower Leg - Medial Wound Status: Open Wounding Event: Surgical Injury Comorbid Coronary Artery Disease, Hypertension, Type Date Acquired: 08/31/2018 History: II Diabetes, Gout Weeks Of Treatment: 0 Clustered Wound: No Photos Photo Uploaded By: Rickey Sherman on 10/27/2018 10:46:02 Wound Measurements Length: (cm) 1.5 Width: (cm) 0.9 Depth: (cm) 0.4 Area: (cm) 1.06 Volume: (cm) 0.424 % Reduction in Area: 0% % Reduction in Volume: 0% Epithelialization: None Tunneling: No Undermining: No Wound Description Classification: Grade 2 Wound Margin: Flat and Intact Exudate Amount: Small Exudate Type: Serous Exudate Color: amber Foul Odor After Cleansing: No Slough/Fibrino Yes Wound Bed Granulation Amount: Small (1-33%) Exposed Structure Granulation Quality: Pale Fascia Exposed: No Necrotic Amount: Large (67-100%) Fat Layer (Subcutaneous Tissue) Exposed: Yes Necrotic Quality: Eschar, Adherent Slough Tendon Exposed: No Muscle Exposed: No Joint Exposed: No Bone Exposed: No Periwound Skin Texture PERCIVAL, GLASHEEN R. ([id number]) Texture Color No Abnormalities Noted: No No Abnormalities Noted: No Callus: No Atrophie Blanche: No Crepitus: No Cyanosis: No Excoriation: No Ecchymosis: No Induration: No Erythema: Yes Rash: No Erythema Location: Circumferential Scarring: No Hemosiderin Staining: No Mottled: No Moisture Pallor: No No Abnormalities Noted: No Rubor: No Dry / Scaly: Yes Maceration: No Temperature / Pain Temperature: No Abnormality Tenderness on Palpation: Yes Wound Preparation Ulcer Cleansing: Rinsed/Irrigated with Saline Topical Anesthetic Applied: Other: lidocaine 4%, Treatment Notes Wound #1 (Left, Medial Lower Leg) Notes prisma, abd, kerlix/coban wrap with unna to anchor Electronic Signature(s) Signed:  10/28/2018 2:41:13 PM By: Rickey Sherman Entered By: Rickey Sherman on 10/27/2018 09:21:54 Rickey Sherman ([id number]) -------------------------------------------------------------------------------- Wound Assessment Details Patient Name: Rickey Sherman, PORZIO. Date of Service: 10/27/2018 8:45 AM Medical Record Number: [id number] Patient Account Number: [id number] Date of Birth/Sex: 05-27-1946 (73 y.o. M) Treating RN: Rickey Sherman Primary Care Kiel Cockerell: Rickey Sherman Other Clinician: Referring Edgel Degnan: Rickey Sherman Treating Dmani Mizer/Extender: Rickey Sherman in Treatment: 0 Wound Status Wound Number: 2 Primary Diabetic Wound/Ulcer of the Lower Etiology: Extremity Wound Location: Left, Proximal, Medial Lower Leg Secondary Open Surgical Wound Wounding Event: Surgical Injury Etiology: Date Acquired: 08/31/2018 Wound Status: Open Weeks Of Treatment: 0 Comorbid Coronary Artery Disease, Hypertension, Clustered Wound: No History: Type II Diabetes,  Gout Wound Measurements Length: (cm) 1.1 Width: (cm) 0.4 Depth: (cm) 0.1 Area: (cm) 0.346 Volume: (cm) 0.035 % Reduction in Area: -4225% % Reduction in Volume: -3400% Epithelialization: Small (1-33%) Tunneling: No Undermining: No Wound Description Classification: Grade 1 Wound Margin: Flat and Intact Exudate Amount: Medium Exudate Type: Serous Exudate Color: amber Foul Odor After Cleansing: No Slough/Fibrino No Wound Bed Granulation Amount: Large (67-100%) Exposed Structure Granulation Quality: Pink Fascia Exposed: No Necrotic Amount: None Present (0%) Fat Layer (Subcutaneous Tissue) Exposed: Yes Tendon Exposed: No Muscle Exposed: No Joint Exposed: No Bone Exposed: No Periwound Skin Texture Texture Color No Abnormalities Noted: No No Abnormalities Noted: No Callus: No Atrophie Blanche: No Crepitus: No Cyanosis: No Excoriation: No Ecchymosis: No Induration: No Erythema: No Rash: No Hemosiderin Staining:  No Scarring: No Mottled: No Pallor: No Moisture Rubor: No No Abnormalities Noted: No Dry / Scaly: No Temperature / Pain Maceration: No Temperature: No Abnormality PEIRCE, DEVENEY R. ([id number]) Tenderness on Palpation: Yes Wound Preparation Ulcer Cleansing: Rinsed/Irrigated with Saline Topical Anesthetic Applied: Other: lidocaine 4%, Treatment Notes Wound #2 (Left, Proximal, Medial Lower Leg) Notes prisma, abd, kerlix/coban wrap with unna to anchor Electronic Signature(s) Signed: 10/27/2018 5:48:26 PM By: Rickey Sherman Entered By: Rickey Sherman on 10/27/2018 10:19:21 Rickey Sherman ([id number]) -------------------------------------------------------------------------------- Vitals Details Patient Name: Rickey Sherman. Date of Service: 10/27/2018 8:45 AM Medical Record Number: [id number] Patient Account Number: [id number] Date of Birth/Sex: March 29, 1946 (73 y.o. M) Treating RN: Rickey Sherman Primary Care Ismeal Heider: Rickey Sherman Other Clinician: Referring Jolleen Seman: Rickey Sherman Treating Riley Papin/Extender: Rickey Sherman in Treatment: 0 Vital Signs Time Taken: 08:51 Temperature (F): 97.9 Height (in): 69 Pulse (bpm): 61 Source: Stated Respiratory Rate (breaths/min): 16 Weight (lbs): 166 Blood Pressure (mmHg): 145/47 Source: Measured Reference Range: 80 - 120 mg / dl Body Mass Index (BMI): 24.5 Electronic Signature(s) Signed: 10/27/2018 9:46:02 AM By: Rickey Sherman Rickey Sherman Entered By: Rickey Sherman on 10/27/2018 08:54:39

## 2018-10-29 NOTE — Progress Notes (Signed)
ESHAWN, COOR ([id number]) Visit Report for 10/27/2018 Abuse/Suicide Risk Screen Details Patient Name: Rickey Sherman, Rickey Sherman. Date of Service: 10/27/2018 8:45 AM Medical Record Number: [id number] Patient Account Number: [id number] Date of Birth/Sex: 1946/09/22 (73 y.o. M) Treating RN: Harold Barban Primary Care Nilsa Macht: Harrel Lemon Other Clinician: Referring Strother Everitt: Lujean Amel Treating Nole Robey/Extender: Tito Dine in Treatment: 0 Abuse/Suicide Risk Screen Items Answer ABUSE/SUICIDE RISK SCREEN: Has anyone close to you tried to hurt or harm you recentlyo No Do you feel uncomfortable with anyone in your familyo No Has anyone forced you do things that you didnot want to doo No Do you have any thoughts of harming yourselfo No Patient displays signs or symptoms of abuse and/or neglect. No Electronic Signature(s) Signed: 10/28/2018 2:41:13 PM By: Harold Barban Entered By: Harold Barban on 10/27/2018 09:10:20 Rickey Sherman ([id number]) -------------------------------------------------------------------------------- Activities of Daily Living Details Patient Name: Rickey Sherman, Rickey Sherman. Date of Service: 10/27/2018 8:45 AM Medical Record Number: [id number] Patient Account Number: [id number] Date of Birth/Sex: 17-Sep-1946 (73 y.o. M) Treating RN: Harold Barban Primary Care Destynie Toomey: Harrel Lemon Other Clinician: Referring Etta Gassett: Lujean Amel Treating Cason Luffman/Extender: Tito Dine in Treatment: 0 Activities of Daily Living Items Answer Activities of Daily Living (Please select one for each item) Drive Automobile Completely Able Take Medications Completely Able Use Telephone Completely Able Care for Appearance Completely Able Use Toilet Completely Able Bath / Shower Completely Able Dress Self Completely Able Feed Self Completely Able Walk Completely Able Get In / Out Bed Completely Able Housework Completely Able Prepare Meals Completely Able Handle  Money Completely Able Shop for Self Completely Able Electronic Signature(s) Signed: 10/28/2018 2:41:13 PM By: Harold Barban Entered By: Harold Barban on 10/27/2018 09:10:38 Rickey Sherman ([id number]) -------------------------------------------------------------------------------- Education Assessment Details Patient Name: Rickey Sherman. Date of Service: 10/27/2018 8:45 AM Medical Record Number: [id number] Patient Account Number: [id number] Date of Birth/Sex: Feb 22, 1946 (73 y.o. M) Treating RN: Harold Barban Primary Care Yaniel Limbaugh: Harrel Lemon Other Clinician: Referring Li Bobo: Lujean Amel Treating Ariza Evans/Extender: Tito Dine in Treatment: 0 Learning Preferences/Education Level/Primary Language Learning Preference: Explanation Highest Education Level: College or Above Cognitive Barrier Assessment/Beliefs Language Barrier: No Translator Needed: No Memory Deficit: No Emotional Barrier: No Cultural/Religious Beliefs Affecting Medical Care: No Physical Barrier Assessment Impaired Vision: No Impaired Hearing: No Decreased Hand dexterity: No Knowledge/Comprehension Assessment Knowledge Level: High Comprehension Level: High Ability to understand written High instructions: Ability to understand verbal High instructions: Motivation Assessment Anxiety Level: Calm Cooperation: Cooperative Education Importance: Acknowledges Need Interest in Health Problems: Asks Questions Perception: Coherent Willingness to Engage in Self- High Management Activities: Readiness to Engage in Self- High Management Activities: Electronic Signature(s) Signed: 10/28/2018 2:41:13 PM By: Harold Barban Entered By: Harold Barban on 10/27/2018 09:11:01 Rickey Sherman ([id number]) -------------------------------------------------------------------------------- Fall Risk Assessment Details Patient Name: Rickey Sherman. Date of Service: 10/27/2018 8:45 AM Medical Record Number:  [id number] Patient Account Number: [id number] Date of Birth/Sex: 1946-02-03 (73 y.o. M) Treating RN: Harold Barban Primary Care Giacomo Valone: Harrel Lemon Other Clinician: Referring Daksh Coates: Lujean Amel Treating Anael Rosch/Extender: Tito Dine in Treatment: 0 Fall Risk Assessment Items Have you had 2 or more falls in the last 12 monthso 0 No Have you had any fall that resulted in injury in the last 12 monthso 0 No FALL RISK ASSESSMENT: History of falling - immediate or within 3 months 0 No Secondary diagnosis 0 No Ambulatory aid None/bed rest/wheelchair/nurse 0 No Crutches/cane/walker 0 No Furniture 0 No IV Access/Saline Lock 0 No Gait/Training  Normal/bed rest/immobile 0 No Weak 0 No Impaired 0 No Mental Status Oriented to own ability 0 Yes Electronic Signature(s) Signed: 10/28/2018 2:41:13 PM By: Harold Barban Entered By: Harold Barban on 10/27/2018 09:11:20 Rickey Sherman ([id number]) -------------------------------------------------------------------------------- Foot Assessment Details Patient Name: Rickey Sherman, Rickey Sherman. Date of Service: 10/27/2018 8:45 AM Medical Record Number: [id number] Patient Account Number: [id number] Date of Birth/Sex: 1946-04-25 (73 y.o. M) Treating RN: Harold Barban Primary Care Floella Ensz: Harrel Lemon Other Clinician: Referring Adaira Centola: Lujean Amel Treating Ender Rorke/Extender: Tito Dine in Treatment: 0 Foot Assessment Items Site Locations + = Sensation present, - = Sensation absent, C = Callus, U = Ulcer R = Redness, W = Warmth, M = Maceration, PU = Pre-ulcerative lesion F = Fissure, S = Swelling, D = Dryness Assessment Right: Left: Other Deformity: No No Prior Foot Ulcer: No No Prior Amputation: No No Charcot Joint: No No Ambulatory Status: Ambulatory Without Help Gait: Steady Electronic Signature(s) Signed: 10/28/2018 2:41:13 PM By: Harold Barban Entered By: Harold Barban on 10/27/2018  09:13:53 Rickey Sherman ([id number]) -------------------------------------------------------------------------------- Nutrition Risk Assessment Details Patient Name: Rickey Sherman. Date of Service: 10/27/2018 8:45 AM Medical Record Number: [id number] Patient Account Number: [id number] Date of Birth/Sex: [birth year] (73 y.o. M) Treating RN: Harold Barban Primary Care Dreshon Proffit: Harrel Lemon Other Clinician: Referring Casson Catena: Lujean Amel Treating Toshie Demelo/Extender: Tito Dine in Treatment: 0 Height (in): 69 Weight (lbs): 166 Body Mass Index (BMI): 24.5 Nutrition Risk Assessment Items NUTRITION RISK SCREEN: I have an illness or condition that made me change the kind and/or amount of 0 No food I eat I eat fewer than two meals per day 0 No I eat few fruits and vegetables, or milk products 0 No I have three or more drinks of beer, liquor or wine almost every day 0 No I have tooth or mouth problems that make it hard for me to eat 0 No I don't always have enough money to buy the food I need 0 No I eat alone most of the time 0 No I take three or more different prescribed or over-the-counter drugs a day 1 Yes Without wanting to, I have lost or gained 10 pounds in the last six months 0 No I am not always physically able to shop, cook and/or feed myself 0 No Nutrition Protocols Good Risk Protocol Moderate Risk Protocol Electronic Signature(s) Signed: 10/28/2018 2:41:13 PM By: Harold Barban Entered By: Harold Barban on 10/27/2018 09:11:30

## 2018-11-01 ENCOUNTER — Encounter: Payer: Medicare Other | Admitting: *Deleted

## 2018-11-01 DIAGNOSIS — E11621 Type 2 diabetes mellitus with foot ulcer: Secondary | ICD-10-CM | POA: Diagnosis not present

## 2018-11-01 DIAGNOSIS — Z951 Presence of aortocoronary bypass graft: Secondary | ICD-10-CM

## 2018-11-01 LAB — GLUCOSE, CAPILLARY
GLUCOSE-CAPILLARY: 156 mg/dL — AB (ref 70–99)
Glucose-Capillary: 174 mg/dL — ABNORMAL HIGH (ref 70–99)

## 2018-11-01 NOTE — Progress Notes (Signed)
Daily Session Note  Patient Details  Name: Rickey Sherman MRN: [id number] Date of Birth: 1946/10/05 Referring Provider:     Cardiac Rehab from 10/28/2018 in [facility] Card[facility]  Referring Provider  Edwina Barth      Encounter Date: 11/01/2018  Check In: Session Check In - 11/01/18 0848      Check-In   Supervising physician immediately available to respond to emergencies  See telemetry face sheet for immediately available ER MD    Location  [facility]-Cardiac & Pulmonary Rehab    Staff Present  Heath Lark, RN, BSN, Laveda Norman, BS, ACSM CEP, Exercise Physiologist;Qiana Land[place], [place], RCEP, CCRP, Exercise Physiologist    Medication changes reported      No    Fall or balance concerns reported     No    Warm-up and Cool-down  Performed as group-led instruction    Resistance Training Performed  Yes    VAD Patient?  No    PAD/SET Patient?  No      Pain Assessment   Currently in Pain?  No/denies          Social History   Tobacco Use  Smoking Status Never Smoker  Smokeless Tobacco Never Used    Goals Met:  Independence with exercise equipment Exercise tolerated well No report of cardiac concerns or symptoms Strength training completed today  Goals Unmet:  Not Applicable  Comments: First full day of exercise!  Patient was oriented to gym and equipment including functions, settings, policies, and procedures.  Patient's individual exercise prescription and treatment plan were reviewed.  All starting workloads were established based on the results of the 6 minute walk test done at initial orientation visit.  The plan for exercise progression was also introduced and progression will be customized based on patient's performance and goals.    Dr. Emily Filbert is Medical Director for [facility].

## 2018-11-03 ENCOUNTER — Encounter: Payer: Medicare Other | Admitting: Internal Medicine

## 2018-11-03 DIAGNOSIS — E11621 Type 2 diabetes mellitus with foot ulcer: Secondary | ICD-10-CM | POA: Diagnosis not present

## 2018-11-03 DIAGNOSIS — Z951 Presence of aortocoronary bypass graft: Secondary | ICD-10-CM

## 2018-11-03 LAB — GLUCOSE, CAPILLARY
GLUCOSE-CAPILLARY: 158 mg/dL — AB (ref 70–99)
Glucose-Capillary: 155 mg/dL — ABNORMAL HIGH (ref 70–99)

## 2018-11-03 NOTE — Progress Notes (Signed)
Daily Session Note  Patient Details  Name: Rickey Sherman MRN: [id number] Date of Birth: [birth year] Referring Provider:     Cardiac Rehab from 10/28/2018 in [facility]  Referring Provider  Edwina Barth      Encounter Date: 11/03/2018  Check In: Session Check In - 11/03/18 0845      Check-In   Supervising physician immediately available to respond to emergencies  See telemetry face sheet for immediately available ER MD    Location  [facility]Card[facility]    Staff Present  Heath Lark, RN, BSN, CCRP;Tanner Yeley Darrin Nipper, [place], [place], [place], Exercise Physiologist    Medication changes reported      No    Fall or balance concerns reported     No    Warm-up and Cool-down  Performed as group-led instruction    Resistance Training Performed  Yes    VAD Patient?  No    PAD/SET Patient?  No      Pain Assessment   Currently in Pain?  No/denies          Social History   Tobacco Use  Smoking Status Never Smoker  Smokeless Tobacco Never Used    Goals Met:  Exercise tolerated well No report of cardiac concerns or symptoms Strength training completed today  Goals Unmet:  Not Applicable  Comments: Pt able to follow exercise prescription today without complaint.  Will continue to monitor for progression.    Dr. Emily Filbert is Medical Director for [facility].

## 2018-11-04 NOTE — Progress Notes (Signed)
Rickey Sherman, Rickey Sherman ([id number]) Visit Report for 11/03/2018 Arrival Information Details Patient Name: Rickey Sherman, Rickey Sherman. Date of Service: 11/03/2018 12:45 PM Medical Record Number: [id number] Patient Account Number: [id number] Date of Birth/Sex: 1945/10/28 (74 y.o. M) Treating RN: Montey Hora Primary Care Willy Pinkerton: Harrel Lemon Other Clinician: Referring Amaani Guilbault: Harrel Lemon Treating Labrisha Wuellner/Extender: Tito Dine in Treatment: 1 Visit Information History Since Last Visit Added or deleted any medications: No Patient Arrived: Ambulatory Any new allergies or adverse reactions: No Arrival Time: 13:03 Had a fall or experienced change in No Accompanied By: wife activities of daily living that may affect Transfer Assistance: None risk of falls: Patient Identification Verified: Yes Signs or symptoms of abuse/neglect since last visito No Secondary Verification Process Completed: Yes Hospitalized since last visit: No Implantable device outside of the clinic excluding No cellular tissue based products placed in the center since last visit: Has Dressing in Place as Prescribed: Yes Has Compression in Place as Prescribed: Yes Pain Present Now: No Electronic Signature(s) Signed: 11/03/2018 4:53:09 PM By: Montey Hora Entered By: Montey Hora on 11/03/2018 13:05:45 Rickey Sherman ([id number]) -------------------------------------------------------------------------------- Encounter Discharge Information Details Patient Name: Rickey Sherman, Rickey Sherman. Date of Service: 11/03/2018 12:45 PM Medical Record Number: [id number] Patient Account Number: [id number] Date of Birth/Sex: [birth year] (73 y.o. M) Treating RN: Montey Hora Primary Care Nieves Barberi: Harrel Lemon Other Clinician: Referring Shan Padgett: Harrel Lemon Treating Amor Packard/Extender: Tito Dine in Treatment: 1 Encounter Discharge Information Items Post Procedure Vitals Discharge Condition: Stable Temperature (F):  98.2 Ambulatory Status: Ambulatory Pulse (bpm): 64 Discharge Destination: Home Respiratory Rate (breaths/min): 18 Transportation: Private Auto Blood Pressure (mmHg): 130/49 Accompanied By: spouse Schedule Follow-up Appointment: Yes Clinical Summary of Care: Electronic Signature(s) Signed: 11/03/2018 2:01:27 PM By: Montey Hora Entered By: Montey Hora on 11/03/2018 14:01:27 Rickey Sherman ([id number]) -------------------------------------------------------------------------------- Lower Extremity Assessment Details Patient Name: Rickey Sherman, Rickey Sherman. Date of Service: 11/03/2018 12:45 PM Medical Record Number: [id number] Patient Account Number: [id number] Date of Birth/Sex: [birth year] (73 y.o. M) Treating RN: Montey Hora Primary Care Maralyn Witherell: Harrel Lemon Other Clinician: Referring Neiman Roots: Harrel Lemon Treating Renea Schoonmaker/Extender: Tito Dine in Treatment: 1 Edema Assessment Assessed: [Left: No] [Right: No] [Left: Edema] [Right: :] Calf Left: Right: Point of Measurement: 33 cm From Medial Instep 33.2 cm cm Ankle Left: Right: Point of Measurement: 11 cm From Medial Instep 21 cm cm Vascular Assessment Pulses: Dorsalis Pedis Palpable: [Left:Yes] [Right:Yes] Posterior Tibial Extremity colors, hair growth, and conditions: Extremity Color: [Left:Hyperpigmented] [Right:Hyperpigmented] Hair Growth on Extremity: [Left:Yes] [Right:Yes] Temperature of Extremity: [Left:Warm] [Right:Warm] Capillary Refill: [Left:< 3 seconds] [Right:< 3 seconds] Toe Nail Assessment Left: Right: Thick: Yes Yes Discolored: No No Deformed: No No Improper Length and Hygiene: No No Electronic Signature(s) Signed: 11/03/2018 4:53:09 PM By: Montey Hora Entered By: Montey Hora on 11/03/2018 13:17:25 Rickey Sherman ([id number]) -------------------------------------------------------------------------------- Multi Wound Chart Details Patient Name: Rickey Sherman. Date of Service:  11/03/2018 12:45 PM Medical Record Number: [id number] Patient Account Number: [id number] Date of Birth/Sex: Feb 25, 1946 (73 y.o. 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 Secondary Etiology: N/A  Open Surgical Wound N/A Comorbid History: Coronary Artery Disease, Coronary Artery Disease, N/A Hypertension, Type II Hypertension, Type II Diabetes, Gout Diabetes, Gout Date Acquired: 08/31/2018 08/31/2018 N/A Weeks of Treatment: 1 1 N/A Wound Status: Open Healed - Epithelialized N/A Measurements L x W x D 1.9x1.2x0.3 0x0x0 N/A (cm) Area (cm) : 1.791 0 N/A Volume (cm) : 0.537 0 N/A % Reduction in Area: -69.00% 100.00% N/A % Reduction in Volume: -26.70% 100.00% N/A Classification: Grade 2 Grade 1 N/A Exudate Amount: Medium None Present N/A Exudate Type: Serous N/A N/A Exudate Color: amber N/A N/A Wound Margin: Flat and Intact Flat and Intact N/A Granulation Amount: Small (1-33%) None Present (0%) N/A Granulation Quality: Pale N/A N/A Necrotic Amount: Large (67-100%) Large (67-100%) N/A Necrotic Tissue: Adherent Slough Eschar N/A Exposed Structures: Fat Layer (Subcutaneous Fat Layer (Subcutaneous N/A Tissue) Exposed: Yes Tissue) Exposed: Yes Rickey Sherman, Rickey Sherman ([id number]) Fascia: No Fascia: No Tendon: No Tendon: No Muscle: No Muscle: No Joint: No Joint: No Bone: No Bone: No Epithelialization: None Small  (1-33%) N/A Debridement: Debridement - Excisional N/A N/A Pre-procedure 13:30 N/A N/A Verification/Time Out Taken: Pain Control: Lidocaine N/A N/A Tissue Debrided: Subcutaneous, Slough N/A N/A Level: Skin/Subcutaneous Tissue N/A N/A Debridement Area (sq cm): 2.28 N/A N/A Instrument: Curette N/A N/A Bleeding: Moderate N/A N/A Hemostasis Achieved: Pressure N/A N/A Procedural Pain: 0 N/A N/A Debridement Treatment Procedure was tolerated well N/A N/A Response: Post Debridement 1.9x1.2x0.4 N/A N/A Measurements L x W x D (cm) Post Debridement Volume: 0.716 N/A N/A (cm) Periwound Skin Texture: Excoriation: No Excoriation: No N/A Induration: No Induration: No Callus: No Callus: No Crepitus: No Crepitus: No Rash: No Rash: No Scarring: No Scarring: No Periwound Skin Moisture: Dry/Scaly: Yes Maceration: No N/A Maceration: No Dry/Scaly: No Periwound Skin Color: Erythema: Yes Atrophie Blanche: No N/A Atrophie Blanche: No Cyanosis: No Cyanosis: No Ecchymosis: No Ecchymosis: No Erythema: No Hemosiderin Staining: No Hemosiderin Staining: No Mottled: No Mottled: No Pallor: No Pallor: No Rubor: No Rubor: No Erythema Location: Circumferential N/A N/A Temperature: No Abnormality No Abnormality N/A Tenderness on Palpation: Yes Yes N/A Wound Preparation: Ulcer Cleansing: Ulcer Cleansing: N/A Rinsed/Irrigated with Saline, Rinsed/Irrigated with Saline, Other: soap and water Other: soap and water Topical Anesthetic Applied: Topical Anesthetic Applied: Other: lidocaine 4% Other: lidocaine 4% Procedures Performed: Debridement N/A N/A Treatment Notes Electronic Signature(s) Signed: 11/03/2018 4:38:41 PM By: Linton Ham MD Entered By: Linton Rickey Sherman on 11/03/2018 13:54:11 Rickey Sherman, Rickey Sherman ([id number]) Rickey Sherman, Rickey Sherman ([id number]) -------------------------------------------------------------------------------- [place] Details Patient Name: PETAR, MUCCI. Date of Service: 11/03/2018 12:45 PM Medical Record Number: [id number] Patient Account Number: [id number] Date of Birth/Sex: [birth year] (73 y.o. M) Treating RN: Montey Hora Primary Care Setareh Rom: Harrel Lemon Other Clinician: Referring Halsey Persaud: Harrel Lemon Treating Lona Six/Extender: Tito Dine in Treatment: 1 Active Inactive Abuse / Safety / Falls / Self Care Management Nursing Diagnoses: Potential for falls Goals: Patient will not experience any injury related to falls Date Initiated: 10/27/2018 Target Resolution Date: 01/22/2019 Goal Status: Active Interventions: Assess fall risk on admission and as needed Notes: Orientation to the Wound Care Program Nursing Diagnoses: Knowledge deficit related to the wound healing center program Goals: Patient/caregiver will verbalize understanding of the [place] Program Date Initiated: 10/27/2018 Target Resolution Date: 01/22/2019 Goal Status: Active Interventions: Provide education on orientation to the wound center Notes: Wound/Skin Impairment Nursing Diagnoses: Impaired tissue integrity Goals: Ulcer/skin breakdown will heal within 14 weeks Date Initiated: 10/27/2018 Target Resolution Date: 01/22/2019 Goal Status: Active Interventions: Assess  patient/caregiver ability to obtain necessary supplies Rickey Sherman, Rickey Sherman ([id number]) Assess patient/caregiver ability to perform ulcer/skin care regimen upon admission and as needed Assess ulceration(s) every visit Notes: Electronic Signature(s) Signed: 11/03/2018 4:53:09 PM By: Montey Hora Entered By: Montey Hora on 11/03/2018 13:16:13 Rickey Sherman ([id number]) -------------------------------------------------------------------------------- Pain Assessment Details Patient Name: Rickey Sherman, Rickey Sherman. Date of Service: 11/03/2018 12:45 PM Medical Record Number: [id number] Patient Account Number: [id number] Date of Birth/Sex: 1946/05/16 (73 y.o. M) Treating RN:  Montey Hora Primary Care Zhuri Krass: Harrel Lemon Other Clinician: Referring Kaelie Henigan: Harrel Lemon Treating Manraj Yeo/Extender: Tito Dine in Treatment: 1 Active Problems Location of Pain Severity and Description of Pain Patient Has Paino No Site Locations Pain Management and Medication Current Pain Management: Electronic Signature(s) Signed: 11/03/2018 4:53:09 PM By: Montey Hora Entered By: Montey Hora on 11/03/2018 13:05:52 Rickey Sherman ([id number]) -------------------------------------------------------------------------------- Patient/Caregiver Education Details Patient Name: Rickey Sherman, Rickey Sherman. Date of Service: 11/03/2018 12:45 PM Medical Record Number: [id number] Patient Account Number: [id number] Date of Birth/Gender: [birth year] (73 y.o. M) Treating RN: Cornell Barman Primary Care Physician: Harrel Lemon Other Clinician: Referring Physician: Harrel Lemon Treating Physician/Extender: Tito Dine in Treatment: 1 Education Assessment Education Provided To: Patient Education Topics Provided Wound/Skin Impairment: Handouts: Caring for Your Ulcer Methods: Demonstration, Explain/Verbal Responses: State content correctly Electronic Signature(s) Signed: 11/03/2018 5:16:46 PM By: Gretta Cool, BSN, RN, CWS, Kim RN, BSN Entered By: Gretta Cool, BSN, RN, CWS, Kim on 11/03/2018 13:36:42 Rickey Sherman ([id number]) -------------------------------------------------------------------------------- Wound Assessment Details Patient Name: Rickey Sherman, Rickey Sherman. Date of Service: 11/03/2018 12:45 PM Medical Record Number: [id number] Patient Account Number: [id number] Date of Birth/Sex: 1946-08-22 (73 y.o. M) Treating RN: Montey Hora Primary Care Fayth Trefry: Harrel Lemon Other Clinician: Referring Lorenia Hoston: Harrel Lemon Treating Berenize Gatlin/Extender: Tito Dine in Treatment: 1 Wound Status Wound Number: 1 Primary Diabetic Wound/Ulcer of the Lower  Extremity Etiology: Wound Location: Left Lower Leg - Medial Wound Status: Open Wounding Event: Surgical Injury Comorbid Coronary Artery Disease, Hypertension, Type Date Acquired: 08/31/2018 History: II Diabetes, Gout Weeks Of Treatment: 1 Clustered Wound: No Photos Photo Uploaded By: Montey Hora on 11/03/2018 13:20:42 Wound Measurements Length: (cm) 1.9 Width: (cm) 1.2 Depth: (cm) 0.3 Area: (cm) 1.791 Volume: (cm) 0.537 % Reduction in Area: -69% % Reduction in Volume: -26.7% Epithelialization: None Tunneling: No Undermining: No Wound Description Classification: Grade 2 Wound Margin: Flat and Intact Exudate Amount: Medium Exudate Type: Serous Exudate Color: amber Foul Odor After Cleansing: No Slough/Fibrino Yes Wound Bed Granulation Amount: Small (1-33%) Exposed Structure Granulation Quality: Pale Fascia Exposed: No Necrotic Amount: Large (67-100%) Fat Layer (Subcutaneous Tissue) Exposed: Yes Necrotic Quality: Adherent Slough Tendon Exposed: No Muscle Exposed: No Joint Exposed: No Bone Exposed: No Periwound Skin Texture SEANMICHAEL, SALMONS R. ([id number]) Texture Color No Abnormalities Noted: No No Abnormalities Noted: No Callus: No Atrophie Blanche: No Crepitus: No Cyanosis: No Excoriation: No Ecchymosis: No Induration: No Erythema: Yes Rash: No Erythema Location: Circumferential Scarring: No Hemosiderin Staining: No Mottled: No Moisture Pallor: No No Abnormalities Noted: No Rubor: No Dry / Scaly: Yes Maceration: No Temperature / Pain Temperature: No Abnormality Tenderness on Palpation: Yes Wound Preparation Ulcer Cleansing: Rinsed/Irrigated with Saline, Other: soap and water, Topical Anesthetic Applied: Other: lidocaine 4%, Treatment Notes Wound #1 (Left, Medial Lower Leg) Notes prisma, abd, kerlix/coban wrap with unna to anchor Electronic Signature(s) Signed: 11/03/2018 4:53:09 PM By: Montey Hora Entered By: Montey Hora on 11/03/2018  13:15:57 Rickey Sherman ([id number]) -------------------------------------------------------------------------------- Wound Assessment Details Patient Name: Rickey Sherman, Rickey Sherman. Date of Service: 11/03/2018  12:45 PM Medical Record Number: [id number] Patient Account Number: [id number] Date of Birth/Sex: 1946-07-03 (73 y.o. M) Treating RN: Cornell Barman Primary Care Ela Moffat: Harrel Lemon Other Clinician: Referring Tayson Schnelle: Harrel Lemon Treating Kodah Maret/Extender: Tito Dine in Treatment: 1 Wound Status Wound Number: 2 Primary Diabetic Wound/Ulcer of the Lower Etiology: Extremity Wound Location: Left, Proximal, Medial Lower Leg Secondary Open Surgical Wound Wounding Event: Surgical Injury Etiology: Date Acquired: 08/31/2018 Wound Status: Healed - Epithelialized Weeks Of Treatment: 1 Comorbid Coronary Artery Disease, Hypertension, Clustered Wound: No History: Type II Diabetes, Gout Photos Photo Uploaded By: Montey Hora on 11/03/2018 13:20:43 Wound Measurements Length: (cm) 0 % Width: (cm) 0 % Depth: (cm) 0 Ep Area: (cm) 0 T Volume: (cm) 0 U Reduction in Area: 100% Reduction in Volume: 100% ithelialization: Small (1-33%) unneling: No ndermining: No Wound Description Classification: Grade 1 Wound Margin: Flat and Intact Exudate Amount: None Present Foul Odor After Cleansing: No Slough/Fibrino No Wound Bed Granulation Amount: None Present (0%) Exposed Structure Necrotic Amount: Large (67-100%) Fascia Exposed: No Necrotic Quality: Eschar Fat Layer (Subcutaneous Tissue) Exposed: Yes Tendon Exposed: No Muscle Exposed: No Joint Exposed: No Bone Exposed: No Periwound Skin Texture Texture Color No Abnormalities Noted: No No Abnormalities Noted: No SHAQUILL, ISEMAN ([id number]) Callus: No Atrophie Blanche: No Crepitus: No Cyanosis: No Excoriation: No Ecchymosis: No Induration: No Erythema: No Rash: No Hemosiderin Staining: No Scarring: No Mottled:  No Pallor: No Moisture Rubor: No No Abnormalities Noted: No Dry / Scaly: No Temperature / Pain Maceration: No Temperature: No Abnormality Tenderness on Palpation: Yes Wound Preparation Ulcer Cleansing: Rinsed/Irrigated with Saline, Other: soap and water, Topical Anesthetic Applied: Other: lidocaine 4%, Electronic Signature(s) Signed: 11/03/2018 5:16:46 PM By: Gretta Cool, BSN, RN, CWS, Kim RN, BSN Entered By: Gretta Cool, BSN, RN, CWS, Kim on 11/03/2018 13:35:38 ELAI, VANWYK ([id number]) -------------------------------------------------------------------------------- Vitals Details Patient Name: DARRLY, LOBERG. Date of Service: 11/03/2018 12:45 PM Medical Record Number: [id number] Patient Account Number: [id number] Date of Birth/Sex: 1946-09-01 (73 y.o. M) Treating RN: Montey Hora Primary Care Darcel Frane: Harrel Lemon Other Clinician: Referring Stran Raper: Harrel Lemon Treating Yen Wandell/Extender: Tito Dine in Treatment: 1 Vital Signs Time Taken: 13:07 Temperature (F): 98.2 Height (in): 69 Pulse (bpm): 64 Weight (lbs): 166 Respiratory Rate (breaths/min): 16 Body Mass Index (BMI): 24.5 Blood Pressure (mmHg): 130/49 Reference Range: 80 - 120 mg / dl Electronic Signature(s) Signed: 11/03/2018 4:53:09 PM By: Montey Hora Entered By: Montey Hora on 11/03/2018 13:07:57

## 2018-11-04 NOTE — Progress Notes (Signed)
HERNANDO, REALI ([id number]) Visit Report for 11/03/2018 Debridement Details Patient Name: Rickey Sherman, Rickey Sherman. Date of Service: 11/03/2018 12:45 PM Medical Record Number: [id number] Patient Account Number: [id number] Date of Birth/Sex: 1946/06/20 (73 y.o. M) Treating RN: Cornell Barman Primary Care Provider: Harrel Lemon Other Clinician: Referring Provider: Harrel Lemon Treating Provider/Extender: Tito Dine in Treatment: 1 Debridement Performed for Wound #1 Left,Medial Lower Leg Assessment: Performed By: Physician Ricard Dillon, MD Debridement Type: Debridement Severity of Tissue Pre Limited to breakdown of skin Debridement: Level of Consciousness (Pre- Awake and Alert procedure): Pre-procedure Verification/Time Yes - 13:30 Out Taken: Start Time: 13:30 Pain Control: Lidocaine Total Area Debrided (L x W): 1.9 (cm) x 1.2 (cm) = 2.28 (cm) Tissue and other material Viable, Non-Viable, Slough, Subcutaneous, Slough debrided: Level: Skin/Subcutaneous Tissue Debridement Description: Excisional Instrument: Curette Bleeding: Moderate Hemostasis Achieved: Pressure End Time: 13:34 Procedural Pain: 0 Response to Treatment: Procedure was tolerated well Level of Consciousness Awake and Alert (Post-procedure): Post Debridement Measurements of Total Wound Length: (cm) 1.9 Width: (cm) 1.2 Depth: (cm) 0.4 Volume: (cm) 0.716 Character of Wound/Ulcer Post Debridement: Requires Further Debridement Severity of Tissue Post Debridement: Fat layer exposed Post Procedure Diagnosis Same as Pre-procedure Electronic Signature(s) Signed: 11/03/2018 4:38:41 PM By: Linton Ham MD Signed: 11/03/2018 5:16:46 PM By: Gretta Cool, BSN, RN, CWS, Kim RN, BSN Entered By: Gretta Cool, BSN, RN, CWS, Kim on 11/03/2018 13:34:44 JACOBS, GOLAB ([id number]) CARLA, WHILDEN ([id number]) -------------------------------------------------------------------------------- HPI Details Patient Name: Rickey Sherman, Rickey Sherman. Date of Service: 11/03/2018 12:45 PM Medical Record Number: [id number] Patient Account Number: [id number] Date of Birth/Sex: [birth year] (73 y.o. M) Treating RN: Cornell Barman Primary Care Provider: Harrel Lemon Other Clinician: Referring Provider: Harrel Lemon Treating Provider/Extender: Tito Dine in Treatment: 1 History of Present Illness HPI Description: ADMISSION 10/27/2018 This is a patient to is 73 year old type II diabetic on oral agents. In the fall of this year he had progressive difficulties with chest/epigastric discomfort. 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 He underwent a CABG x4 at [facility] on November 19. Among other things he had a left greater saphenous vein harvest. He had a complicated postop course. The area on his left leg was sutured but dehisced. The sutures have since been removed. He has been left with eschar over the top. He was apparently told to "leave this open to air". He has been washing it with soap and water. He did receive a course of doxycycline from his primary doctor in mid December he is finished that now. He does not have a known history of peripheral arterial disease. He does have a history of diabetic peripheral neuropathy. ABIs in this clinic were 1.00 on the right and 1.09 on the left Past medical history includes CABG x4 at [facility] on November 14, hypertension, type 2 diabetes with peripheral neuropathy with a recent hemoglobin A1c of 7.3, cervical radicular pain, basal cell CA of the scalp. 11/03/2018; type 2 diabetes on oral agents. These are 2 wounds in a greater saphenous vein harvest site for a CABG on November 19. The more superior part of this on the surgical line is healed. He has a much better looking surface on this this week. He has been using Prisma under compression Electronic Signature(s) Signed: 11/03/2018 4:38:41 PM By: Linton Ham MD Entered By: Linton Ham on 11/03/2018 13:55:09 Rickey Sherman ([id number]) -------------------------------------------------------------------------------- Physical Exam Details Patient Name: Rickey Sherman, Rickey Sherman. Date of Service: 11/03/2018 12:45 PM Medical  Record Number: [id number] Patient Account Number: [id number] Date of Birth/Sex: March 26, 1946 (73 y.o. M) Treating RN: Cornell Barman Primary Care Provider: Harrel Lemon Other Clinician: Referring Provider: Harrel Lemon Treating Provider/Extender: Tito Dine in Treatment: 1 Constitutional Sitting or standing Blood Pressure is within target range for patient.. Pulse regular and within target range for patient.Marland Kitchen Respirations regular, non-labored and within target range.Marland Kitchen appears in no distress. Notes Wound exam; the superior part of this area is closed. The more distal area still has some eschar but this is generally better than last week. Using a #5 curette I remove the remaining part of the adherent adherent surface necrotic material. I think his wound bed will look a lot better next week. Electronic Signature(s) Signed: 11/03/2018 4:38:41 PM By: Linton Ham MD Entered By: Linton Ham on 11/03/2018 13:56:09 Rickey Sherman ([id number]) -------------------------------------------------------------------------------- Physician Orders Details Patient Name: Rickey Sherman, Rickey Sherman. Date of Service: 11/03/2018 12:45 PM Medical Record Number: [id number] Patient Account Number: [id number] Date of Birth/Sex: 14-Nov-1945 (73 y.o. M) Treating RN: Cornell Barman Primary Care Provider: Harrel Lemon Other Clinician: Referring Provider: Harrel Lemon Treating Provider/Extender: Tito Dine in Treatment: 1 Verbal / Phone Orders: No Diagnosis Coding Wound Cleansing Wound #1 Left,Medial Lower Leg o Clean wound with Normal Saline. o May shower with protection. Anesthetic (add to Medication List) Wound #1 Left,Medial Lower Leg o Topical  Lidocaine 4% cream applied to wound bed prior to debridement (In Clinic Only). Primary Wound Dressing Wound #1 Left,Medial Lower Leg o Silver Collagen Secondary Dressing Wound #1 Left,Medial Lower Leg o ABD pad Dressing Change Frequency Wound #1 Left,Medial Lower Leg o Change dressing every week Follow-up Appointments Wound #1 Left,Medial Lower Leg o Return Appointment in 1 week. Edema Control Wound #1 Left,Medial Lower Leg o Kerlix and Coban - Left Lower Extremity Electronic Signature(s) Signed: 11/03/2018 4:38:41 PM By: Linton Ham MD Signed: 11/03/2018 5:16:46 PM By: Gretta Cool, BSN, RN, CWS, Kim RN, BSN Entered By: Gretta Cool, BSN, RN, CWS, Kim on 11/03/2018 13:36:11 Rickey Sherman, Rickey Sherman ([id number]) -------------------------------------------------------------------------------- Problem List Details Patient Name: Rickey Sherman, Rickey Sherman. Date of Service: 11/03/2018 12:45 PM Medical Record Number: [id number] Patient Account Number: [id number] Date of Birth/Sex: [birth year] (73 y.o. M) Treating RN: Cornell Barman Primary Care Provider: Harrel Lemon Other Clinician: Referring Provider: Harrel Lemon Treating Provider/Extender: Tito Dine in Treatment: 1 Active Problems ICD-10 Evaluated Encounter Code Description Active Date Today Diagnosis T81.31XD Disruption of external operation (surgical) wound, not 10/27/2018 No Yes elsewhere classified, subsequent encounter L97.222 Non-pressure chronic ulcer of left calf with fat layer exposed 10/27/2018 No Yes E11.622 Type 2 diabetes mellitus with other skin ulcer 10/27/2018 No Yes Inactive Problems Resolved Problems Electronic Signature(s) Signed: 11/03/2018 4:38:41 PM By: Linton Ham MD Entered By: Linton Ham on 11/03/2018 13:54:03 Rickey Sherman ([id number]) -------------------------------------------------------------------------------- Progress Note Details Patient Name: Rickey Sherman. Date of Service: 11/03/2018 12:45  PM Medical Record Number: [id number] Patient Account Number: [id number] Date of Birth/Sex: January 11, 1946 (73 y.o. M) Treating RN: Cornell Barman Primary Care Provider: Harrel Lemon Other Clinician: Referring Provider: Harrel Lemon Treating Provider/Extender: Tito Dine in Treatment: 1 Subjective History of Present Illness (HPI) ADMISSION 10/27/2018 This is a patient to is 73 year old type II diabetic on oral agents. In the fall of this year he had progressive difficulties with chest/epigastric discomfort. 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 He underwent a CABG x4 at [facility] on November 19. Among other things he had a left  greater saphenous vein harvest. He had a complicated postop course. The area on his left leg was sutured but dehisced. The sutures have since been removed. He has been left with eschar over the top. He was apparently told to "leave this open to air". He has been washing it with soap and water. He did receive a course of doxycycline from his primary doctor in mid December he is finished that now. He does not have a known history of peripheral arterial disease. He does have a history of diabetic peripheral neuropathy. ABIs in this clinic were 1.00 on the right and 1.09 on the left Past medical history includes CABG x4 at [facility] on November 14, hypertension, type 2 diabetes with peripheral neuropathy with a recent hemoglobin A1c of 7.3, cervical radicular pain, basal cell CA of the scalp. 11/03/2018; type 2 diabetes on oral agents. These are 2 wounds in a greater saphenous vein harvest site for a CABG on November 19. The more superior part of this on the surgical line is healed. He has a much better looking surface on this this week. He has been using Prisma under compression Objective Constitutional Sitting or standing Blood Pressure is within target range for patient.. Pulse regular and within target range  for patient.Marland Kitchen Respirations regular, non-labored and within target range.Marland Kitchen appears in no distress. Vitals Time Taken: 1:07 PM, Height: 69 in, Weight: 166 lbs, BMI: 24.5, Temperature: 98.2 F, Pulse: 64 bpm, Respiratory Rate: 16 breaths/min, Blood Pressure: 130/49 mmHg. General Notes: Wound exam; the superior part of this area is closed. The more distal area still has some eschar but this is generally better than last week. Using a #5 curette I remove the remaining part of the adherent adherent surface necrotic material. I think his wound bed will look a lot better next week. Integumentary (Hair, Skin) Wound #1 status is Open. Original cause of wound was Surgical Injury. The wound is located on the Left,Medial Lower Leg. The wound measures 1.9cm length x 1.2cm width x 0.3cm depth; 1.791cm^2 area and 0.537cm^3 volume. There is Fat Rickey Sherman, Rickey R. ([id number]) (Subcutaneous Tissue) Exposed exposed. There is no tunneling or undermining noted. There is a medium amount of serous drainage noted. The wound margin is flat and intact. There is small (1-33%) pale granulation within the wound bed. There is a large (67-100%) amount of necrotic tissue within the wound bed including Adherent Slough. The periwound skin appearance exhibited: Dry/Scaly, Erythema. The periwound skin appearance did not exhibit: Callus, Crepitus, Excoriation, Induration, Rash, Scarring, Maceration, Atrophie Blanche, Cyanosis, Ecchymosis, Hemosiderin Staining, Mottled, Pallor, Rubor. The surrounding wound skin color is noted with erythema which is circumferential. Periwound temperature was noted as No Abnormality. The periwound has tenderness on palpation. Wound #2 status is Healed - Epithelialized. Original cause of wound was Surgical Injury. The wound is located on the Left,Proximal,Medial Lower Leg. The wound measures 0cm length x 0cm width x 0cm depth; 0cm^2 area and 0cm^3 volume. There is Fat Layer (Subcutaneous Tissue)  Exposed exposed. There is no tunneling or undermining noted. There is a none present amount of drainage noted. The wound margin is flat and intact. There is no granulation within the wound bed. There is a large (67-100%) amount of necrotic tissue within the wound bed including Eschar. The periwound skin appearance did not exhibit: Callus, Crepitus, Excoriation, Induration, Rash, Scarring, Dry/Scaly, Maceration, Atrophie Blanche, Cyanosis, Ecchymosis, Hemosiderin Staining, Mottled, Pallor, Rubor, Erythema. Periwound temperature was noted as No Abnormality. The periwound has tenderness on palpation. Assessment  Active Problems ICD-10 Disruption of external operation (surgical) wound, not elsewhere classified, subsequent encounter Non-pressure chronic ulcer of left calf with fat layer exposed Type 2 diabetes mellitus with other skin ulcer Procedures Wound #1 Pre-procedure diagnosis of Wound #1 is a Diabetic Wound/Ulcer of the Lower Extremity located on the Left,Medial Lower Leg .Severity of Tissue Pre Debridement is: Limited to breakdown of skin. There was a Excisional Skin/Subcutaneous Tissue Debridement with a total area of 2.28 sq cm performed by Ricard Dillon, MD. With the following instrument(s): Curette to remove Viable and Non-Viable tissue/material. Material removed includes Subcutaneous Tissue and Slough and after achieving pain control using Lidocaine. No specimens were taken. A time out was conducted at 13:30, prior to the start of the procedure. A Moderate amount of bleeding was controlled with Pressure. The procedure was tolerated well with a pain level of 0 throughout. Post Debridement Measurements: 1.9cm length x 1.2cm width x 0.4cm depth; 0.716cm^3 volume. Character of Wound/Ulcer Post Debridement requires further debridement. Severity of Tissue Post Debridement is: Fat layer exposed. Post procedure Diagnosis Wound #1: Same as Pre-Procedure Plan Wound Cleansing: Wound #1  Left,Medial Lower Leg: Clean wound with Normal Saline. Rickey Sherman, Rickey Sherman ([id number]) May shower with protection. Anesthetic (add to Medication List): Wound #1 Left,Medial Lower Leg: Topical Lidocaine 4% cream applied to wound bed prior to debridement (In Clinic Only). Primary Wound Dressing: Wound #1 Left,Medial Lower Leg: Silver Collagen Secondary Dressing: Wound #1 Left,Medial Lower Leg: ABD pad Dressing Change Frequency: Wound #1 Left,Medial Lower Leg: Change dressing every week Follow-up Appointments: Wound #1 Left,Medial Lower Leg: Return Appointment in 1 week. Edema Control: Wound #1 Left,Medial Lower Leg: Kerlix and Coban - Left Lower Extremity 1 continue with silver collagen under compression the wound is doing well I am hopeful next week there will be no debridement necessary and the depth will come in Electronic Signature(s) Signed: 11/03/2018 4:38:41 PM By: Linton Ham MD Entered By: Linton Ham on 11/03/2018 13:58:43 Rickey Sherman ([id number]) -------------------------------------------------------------------------------- SuperBill Details Patient Name: Rickey Sherman. Date of Service: 11/03/2018 Medical Record Number: [id number] Patient Account Number: [id number] Date of Birth/Sex: 09/03/1946 (73 y.o. M) Treating RN: Cornell Barman Primary Care Provider: Harrel Lemon Other Clinician: Referring Provider: Harrel Lemon Treating Provider/Extender: Tito Dine in Treatment: 1 Diagnosis Coding ICD-10 Codes Code Description T81.31XD Disruption of external operation (surgical) wound, not elsewhere classified, subsequent encounter L97.222 Non-pressure chronic ulcer of left calf with fat layer exposed E11.622 Type 2 diabetes mellitus with other skin ulcer Facility Procedures CPT4: Description Modifier Quantity Code [id number] 11042 - DEB SUBQ TISSUE 20 SQ CM/< 1 ICD-10 Diagnosis Description T81.31XD Disruption of external operation (surgical) wound, not  elsewhere classified, subsequent encounter L97.222 Non-pressure chronic  ulcer of left calf with fat layer exposed Physician Procedures CPT4: Description Modifier Quantity Code [id number] - WC PHYS SUBQ TISS 20 SQ CM 1 ICD-10 Diagnosis Description T81.31XD Disruption of external operation (surgical) wound, not elsewhere classified, subsequent encounter L97.222 Non-pressure chronic  ulcer of left calf with fat layer exposed Electronic Signature(s) Signed: 11/03/2018 4:38:41 PM By: Linton Ham MD Entered By: Linton Ham on 11/03/2018 13:59:09

## 2018-11-05 ENCOUNTER — Encounter: Payer: Medicare Other | Admitting: *Deleted

## 2018-11-05 DIAGNOSIS — Z951 Presence of aortocoronary bypass graft: Secondary | ICD-10-CM

## 2018-11-05 DIAGNOSIS — E11621 Type 2 diabetes mellitus with foot ulcer: Secondary | ICD-10-CM | POA: Diagnosis not present

## 2018-11-05 LAB — GLUCOSE, CAPILLARY
Glucose-Capillary: 179 mg/dL — ABNORMAL HIGH (ref 70–99)
Glucose-Capillary: 141 mg/dL — ABNORMAL HIGH (ref 70–99)

## 2018-11-05 NOTE — Progress Notes (Signed)
Daily Session Note  Patient Details  Name: Rickey Sherman MRN: [id number] Date of Birth: Mar 11, 1946 Referring Provider:     Cardiac Rehab from 10/28/2018 in [facility] Cardiac and Pulmonary Rehab  Referring Provider  Edwina Barth      Encounter Date: 11/05/2018  Check In: Session Check In - 11/05/18 0924      Check-In   Supervising physician immediately available to respond to emergencies  See telemetry face sheet for immediately available ER MD    Location  [facility]-Cardiac & Pulmonary Rehab    Staff Present  Renita Papa, RN Vickki Hearing, BA, ACSM CEP, Exercise Physiologist;Jessica Luan Pulling, MA, RCEP, CCRP, Exercise Physiologist    Medication changes reported      No    Fall or balance concerns reported     No    Warm-up and Cool-down  Performed as group-led instruction    Resistance Training Performed  Yes    VAD Patient?  No    PAD/SET Patient?  No      Pain Assessment   Currently in Pain?  No/denies          Social History   Tobacco Use  Smoking Status Never Smoker  Smokeless Tobacco Never Used    Goals Met:  Independence with exercise equipment Exercise tolerated well No report of cardiac concerns or symptoms Strength training completed today  Goals Unmet:  Not Applicable  Comments: Pt able to follow exercise prescription today without complaint.  Will continue to monitor for progression.    Dr. Emily Filbert is Medical Director for [place] and [facility].

## 2018-11-08 ENCOUNTER — Encounter: Payer: Medicare Other | Admitting: *Deleted

## 2018-11-08 DIAGNOSIS — E11621 Type 2 diabetes mellitus with foot ulcer: Secondary | ICD-10-CM | POA: Diagnosis not present

## 2018-11-08 DIAGNOSIS — Z951 Presence of aortocoronary bypass graft: Secondary | ICD-10-CM

## 2018-11-08 NOTE — Progress Notes (Signed)
Daily Session Note  Patient Details  Name: Rickey Sherman MRN: [id number] Date of Birth: [birth year] Referring Provider:     Cardiac Rehab from 10/28/2018 in [facility] Cardiac and Pulmonary Rehab  Referring Provider  Edwina Barth      Encounter Date: 11/08/2018  Check In: Session Check In - 11/08/18 [place]      Check-In   Supervising physician immediately available to respond to emergencies  See telemetry face sheet for immediately available ER MD    Location  [facility]-Cardiac & Pulmonary Rehab    Staff Present  Earlean Shawl, BS, ACSM CEP, Exercise Physiologist;Jessica Luan Pulling, MA, RCEP, CCRP, Exercise Physiologist;Susanne Bice, RN, BSN, CCRP    Medication changes reported      No    Fall or balance concerns reported     No    Tobacco Cessation  No Change    Warm-up and Cool-down  Performed as group-led instruction    Resistance Training Performed  Yes    VAD Patient?  No    PAD/SET Patient?  No      Pain Assessment   Currently in Pain?  No/denies    Multiple Pain Sites  No          Social History   Tobacco Use  Smoking Status Never Smoker  Smokeless Tobacco Never Used    Goals Met:  Independence with exercise equipment Exercise tolerated well No report of cardiac concerns or symptoms Strength training completed today  Goals Unmet:  Not Applicable  Comments: Pt able to follow exercise prescription today without complaint.  Will continue to monitor for progression.    Dr. Emily Filbert is Medical Director for [place] and [facility].

## 2018-11-10 ENCOUNTER — Encounter: Payer: Medicare Other | Admitting: Internal Medicine

## 2018-11-10 DIAGNOSIS — E11621 Type 2 diabetes mellitus with foot ulcer: Secondary | ICD-10-CM | POA: Diagnosis not present

## 2018-11-10 DIAGNOSIS — Z951 Presence of aortocoronary bypass graft: Secondary | ICD-10-CM

## 2018-11-10 NOTE — Progress Notes (Signed)
Daily Session Note  Patient Details  Name: HIRAM MCIVER MRN: [id number] Date of Birth: [birth year] Referring Provider:     Cardiac Rehab from 10/28/2018 in [facility] Cardiac and Pulmonary Rehab  Referring Provider  Edwina Barth      Encounter Date: 11/10/2018  Check In: Session Check In - 11/10/18 0800      Check-In   Supervising physician immediately available to respond to emergencies  See telemetry face sheet for immediately available ER MD    Location  [facility]-Cardiac & Pulmonary Rehab    Staff Present  Heath Lark, RN, BSN, CCRP;Jessica [place], MA, RCEP, CCRP, Exercise Physiologist;Sonji Starkes Tessie Fass RCP,RRT,BSRT    Medication changes reported      No    Fall or balance concerns reported     No    Warm-up and Cool-down  Performed as group-led instruction    Resistance Training Performed  Yes    VAD Patient?  No      Pain Assessment   Currently in Pain?  No/denies          Social History   Tobacco Use  Smoking Status Never Smoker  Smokeless Tobacco Never Used    Goals Met:  Independence with exercise equipment Exercise tolerated well No report of cardiac concerns or symptoms Strength training completed today  Goals Unmet:  Not Applicable  Comments: Pt able to follow exercise prescription today without complaint.  Will continue to monitor for progression.  Reviewed home exercise with pt today.  Pt plans to walk and use weights at home for exercise.  Reviewed THR, pulse, RPE, sign and symptoms, and when to call 911 or MD.  Also discussed weather considerations and indoor options.  Pt voiced understanding.   Dr. Emily Filbert is Medical Director for [place] and [facility].

## 2018-11-12 ENCOUNTER — Encounter: Payer: Medicare Other | Admitting: *Deleted

## 2018-11-12 DIAGNOSIS — Z951 Presence of aortocoronary bypass graft: Secondary | ICD-10-CM

## 2018-11-12 DIAGNOSIS — E11621 Type 2 diabetes mellitus with foot ulcer: Secondary | ICD-10-CM | POA: Diagnosis not present

## 2018-11-12 NOTE — Progress Notes (Signed)
Daily Session Note  Patient Details  Name: Rickey Sherman MRN: [id number] Date of Birth: 23-Jun-1946 Referring Provider:     Cardiac Rehab from 10/28/2018 in [facility] Cardiac and Pulmonary Rehab  Referring Provider  Edwina Barth      Encounter Date: 11/12/2018  Check In: Session Check In - 11/12/18 7169      Check-In   Supervising physician immediately available to respond to emergencies  See telemetry face sheet for immediately available ER MD    Location  [facility]-Cardiac & Pulmonary Rehab    Staff Present  Renita Papa, RN BSN;Shealyn Sean Luan Pulling, MA, RCEP, CCRP, Exercise Physiologist;Amanda Oletta Darter, [place], ACSM CEP, Exercise Physiologist    Medication changes reported      No    Fall or balance concerns reported     No    Warm-up and Cool-down  Performed as group-led instruction    Resistance Training Performed  Yes    VAD Patient?  No    PAD/SET Patient?  No      Pain Assessment   Currently in Pain?  No/denies          Social History   Tobacco Use  Smoking Status Never Smoker  Smokeless Tobacco Never Used    Goals Met:  Independence with exercise equipment Exercise tolerated well No report of cardiac concerns or symptoms Strength training completed today  Goals Unmet:  Not Applicable  Comments: Pt able to follow exercise prescription today without complaint.  Will continue to monitor for progression.    Dr. Emily Filbert is Medical Director for [place] and [facility].

## 2018-11-12 NOTE — Progress Notes (Signed)
WILLIARD, KELLER ([id number]) Visit Report for 11/10/2018 Arrival Information Details Patient Name: Rickey Sherman, Rickey Sherman. Date of Service: 11/10/2018 3:30 PM Medical Record Number: [id number] Patient Account Number: [id number] Date of Birth/Sex: April 08, 1946 (73 y.o. M) Treating RN: Cornell Barman Primary Care Christophere Hillhouse: Harrel Lemon Other Clinician: Referring Therin Vetsch: Harrel Lemon Treating Shylyn Younce/Extender: Tito Dine in Treatment: 2 Visit Information History Since Last Visit Added or deleted any medications: No Patient Arrived: Ambulatory Any new allergies or adverse reactions: No Arrival Time: 15:45 Had a fall or experienced change in No Accompanied By: Parke Simmers activities of daily living that may affect Transfer Assistance: None risk of falls: Patient Identification Verified: Yes Signs or symptoms of abuse/neglect since last visito No Secondary Verification Process Completed: Yes Hospitalized since last visit: No Implantable device outside of the clinic excluding No cellular tissue based products placed in the center since last visit: Has Dressing in Place as Prescribed: Yes Pain Present Now: No Electronic Signature(s) Signed: 11/10/2018 4:05:04 PM By: Lorine Bears RCP, RRT, CHT Entered By: Lorine Bears on 11/10/2018 15:46:07 Rickey Sherman ([id number]) -------------------------------------------------------------------------------- Lower Extremity Assessment Details Patient Name: Rickey Sherman, Rickey Sherman. Date of Service: 11/10/2018 3:30 PM Medical Record Number: [id number] Patient Account Number: [id number] Date of Birth/Sex: Jul 06, 1946 (73 y.o. M) Treating RN: Secundino Ginger Primary Care Cabe Lashley: Harrel Lemon Other Clinician: Referring Weylyn Ricciuti: Harrel Lemon Treating Caria Transue/Extender: Tito Dine in Treatment: 2 Edema Assessment Assessed: [Left: No] [Right: No] Edema: [Left: N] [Right: o] Calf Left: Right: Point of Measurement: 33 cm  From Medial Instep 32 cm cm Ankle Left: Right: Point of Measurement: 11 cm From Medial Instep 20 cm cm Vascular Assessment Claudication: Claudication Assessment [Left:None] Pulses: Dorsalis Pedis Palpable: [Left:Yes] Posterior Tibial Extremity colors, hair growth, and conditions: Extremity Color: [Left:Normal] Hair Growth on Extremity: [Left:No] Temperature of Extremity: [Left:Warm] Capillary Refill: [Left:< 3 seconds] Toe Nail Assessment Left: Right: Thick: No Discolored: No Deformed: No Improper Length and Hygiene: No Electronic Signature(s) Signed: 11/10/2018 4:29:26 PM By: Secundino Ginger Entered By: Secundino Ginger on 11/10/2018 16:19:42 Rickey Sherman ([id number]) -------------------------------------------------------------------------------- Multi Wound Chart Details Patient Name: Rickey Sherman. Date of Service: 11/10/2018 3:30 PM Medical Record Number: [id number] Patient Account Number: [id number] Date of Birth/Sex: 09-Apr-1946 (73 y.o. M) Treating RN: Cornell Barman Primary Care Wynnie Pacetti: Harrel Lemon Other Clinician: Referring Alberto Pina: Harrel Lemon Treating Brionne Mertz/Extender: Tito Dine in Treatment: 2 Vital Signs Height(in): 69 Pulse(bpm): 73 Weight(lbs): 166 Blood Pressure(mmHg): 120/56 Body Mass Index(BMI): 25 Temperature(F): 98.3 Respiratory Rate 16 (breaths/min): Photos: [N/A:N/A] Wound Location: Left Lower Leg - Medial N/A N/A Wounding Event: Surgical Injury N/A N/A Primary Etiology: Diabetic Wound/Ulcer of the N/A N/A Lower Extremity Comorbid History: Coronary Artery Disease, N/A N/A Hypertension, Type II Diabetes, Gout Date Acquired: 08/31/2018 N/A N/A Weeks of Treatment: 2 N/A N/A Wound Status: Open N/A N/A Measurements L x W x D 1.6x0.9x0.3 N/A N/A (cm) Area (cm) : 1.131 N/A N/A Volume (cm) : 0.339 N/A N/A % Reduction in Area: -6.70% N/A N/A % Reduction in Volume: 20.00% N/A N/A Classification: Grade 2 N/A N/A Exudate Amount:  Medium N/A N/A Exudate Type: Serous N/A N/A Exudate Color: amber N/A N/A Wound Margin: Flat and Intact N/A N/A Granulation Amount: Small (1-33%) N/A N/A Granulation Quality: Pale N/A N/A Necrotic Amount: Large (67-100%) N/A N/A Necrotic Tissue: Eschar, Adherent Slough N/A N/A Exposed Structures: Fat Layer (Subcutaneous N/A N/A Tissue) Exposed: Yes Fascia: No Tendon: No Rickey Sherman, Rickey R. ([id number]) Muscle: No Joint: No Bone: No Epithelialization:  None N/A N/A Debridement: Debridement - Excisional N/A N/A Pre-procedure 16:46 N/A N/A Verification/Time Out Taken: Pain Control: Lidocaine N/A N/A Tissue Debrided: Subcutaneous, Slough N/A N/A Level: Skin/Subcutaneous Tissue N/A N/A Debridement Area (sq cm): 1.44 N/A N/A Instrument: Curette N/A N/A Bleeding: Minimum N/A N/A Hemostasis Achieved: Pressure N/A N/A Procedural Pain: 0 N/A N/A Post Procedural Pain: 0 N/A N/A Debridement Treatment Procedure was tolerated well N/A N/A Response: Post Debridement 1.6x0.9x0.3 N/A N/A Measurements L x W x D (cm) Post Debridement Volume: 0.339 N/A N/A (cm) Periwound Skin Texture: Excoriation: No N/A N/A Induration: No Callus: No Crepitus: No Rash: No Scarring: No Periwound Skin Moisture: Dry/Scaly: Yes N/A N/A Maceration: No Periwound Skin Color: Atrophie Blanche: No N/A N/A Cyanosis: No Ecchymosis: No Erythema: No Hemosiderin Staining: No Mottled: No Pallor: No Rubor: No Temperature: No Abnormality N/A N/A Tenderness on Palpation: Yes N/A N/A Wound Preparation: Ulcer Cleansing: N/A N/A Rinsed/Irrigated with Saline, Other: soap and water Topical Anesthetic Applied: Other: lidocaine 4% Procedures Performed: Debridement N/A N/A Treatment Notes Electronic Signature(s) Signed: 11/10/2018 5:53:07 PM By: Linton Ham MD Entered By: Linton Ham on 11/10/2018 17:48:53 Rickey Sherman, Rickey Sherman  ([id number]) -------------------------------------------------------------------------------- Multi-Disciplinary Care Plan Details Patient Name: Rickey Sherman, Rickey Sherman. Date of Service: 11/10/2018 3:30 PM Medical Record Number: [id number] Patient Account Number: [id number] Date of Birth/Sex: 26-May-1946 (73 y.o. M) Treating RN: Cornell Barman Primary Care Demtrius Rounds: Harrel Lemon Other Clinician: Referring Galia Rahm: Harrel Lemon Treating Odean Fester/Extender: Tito Dine in Treatment: 2 Active Inactive Abuse / Safety / Falls / Self Care Management Nursing Diagnoses: Potential for falls Goals: Patient will not experience any injury related to falls Date Initiated: 10/27/2018 Target Resolution Date: 01/22/2019 Goal Status: Active Interventions: Assess fall risk on admission and as needed Notes: Orientation to the Wound Care Program Nursing Diagnoses: Knowledge deficit related to the wound healing center program Goals: Patient/caregiver will verbalize understanding of the [facility] Date Initiated: 10/27/2018 Target Resolution Date: 01/22/2019 Goal Status: Active Interventions: Provide education on orientation to the wound center Notes: Wound/Skin Impairment Nursing Diagnoses: Impaired tissue integrity Goals: Ulcer/skin breakdown will heal within 14 weeks Date Initiated: 10/27/2018 Target Resolution Date: 01/22/2019 Goal Status: Active Interventions: Assess patient/caregiver ability to obtain necessary supplies Rickey Sherman, Rickey Sherman ([id number]) Assess patient/caregiver ability to perform ulcer/skin care regimen upon admission and as needed Assess ulceration(s) every visit Notes: Electronic Signature(s) Signed: 11/11/2018 5:43:44 PM By: Gretta Cool, BSN, RN, CWS, Kim RN, BSN Entered By: Gretta Cool, BSN, RN, CWS, Kim on 11/10/2018 16:46:24 Rickey Sherman ([id number]) -------------------------------------------------------------------------------- Pain Assessment Details Patient Name:  Rickey Sherman, Rickey Sherman. Date of Service: 11/10/2018 3:30 PM Medical Record Number: [id number] Patient Account Number: [id number] Date of Birth/Sex: 1946/08/25 (73 y.o. M) Treating RN: Cornell Barman Primary Care Riata Ikeda: Harrel Lemon Other Clinician: Referring Laneka Mcgrory: Harrel Lemon Treating Trayonna Bachmeier/Extender: Tito Dine in Treatment: 2 Active Problems Location of Pain Severity and Description of Pain Patient Has Paino No Site Locations Pain Management and Medication Current Pain Management: Electronic Signature(s) Signed: 11/10/2018 4:05:04 PM By: Paulla Fore, RRT, CHT Signed: 11/11/2018 5:43:44 PM By: Gretta Cool, BSN, RN, CWS, Kim RN, BSN Entered By: Lorine Bears on 11/10/2018 15:46:16 Rickey Sherman, Rickey Sherman ([id number]) -------------------------------------------------------------------------------- Wound Assessment Details Patient Name: Rickey Sherman, Rickey Sherman. Date of Service: 11/10/2018 3:30 PM Medical Record Number: [id number] Patient Account Number: [id number] Date of Birth/Sex: 1946/04/27 (73 y.o. M) Treating RN: Secundino Ginger Primary Care Kijuan Gallicchio: Harrel Lemon Other Clinician: Referring Lathaniel Legate: Harrel Lemon Treating Dolora Ridgely/Extender: Tito Dine in Treatment: 2  Wound Status Wound Number: 1 Primary Diabetic Wound/Ulcer of the Lower Extremity Etiology: Wound Location: Left Lower Leg - Medial Wound Status: Open Wounding Event: Surgical Injury Comorbid Coronary Artery Disease, Hypertension, Type Date Acquired: 08/31/2018 History: II Diabetes, Gout Weeks Of Treatment: 2 Clustered Wound: No Photos Photo Uploaded By: Secundino Ginger on 11/10/2018 16:28:41 Wound Measurements Length: (cm) 1.6 Width: (cm) 0.9 Depth: (cm) 0.3 Area: (cm) 1.131 Volume: (cm) 0.339 % Reduction in Area: -6.7% % Reduction in Volume: 20% Epithelialization: None Tunneling: No Undermining: No Wound Description Classification: Grade 2 Foul Odor Wound Margin:  Flat and Intact Slough/Fi Exudate Amount: Medium Exudate Type: Serous Exudate Color: amber After Cleansing: No brino Yes Wound Bed Granulation Amount: Small (1-33%) Exposed Structure Granulation Quality: Pale Fascia Exposed: No Necrotic Amount: Large (67-100%) Fat Layer (Subcutaneous Tissue) Exposed: Yes Necrotic Quality: Eschar, Adherent Slough Tendon Exposed: No Muscle Exposed: No Joint Exposed: No Bone Exposed: No Periwound Skin Texture Rickey Sherman, Rickey R. ([id number]) Texture Color No Abnormalities Noted: No No Abnormalities Noted: No Callus: No Atrophie Blanche: No Crepitus: No Cyanosis: No Excoriation: No Ecchymosis: No Induration: No Erythema: No Rash: No Hemosiderin Staining: No Scarring: No Mottled: No Pallor: No Moisture Rubor: No No Abnormalities Noted: No Dry / Scaly: Yes Temperature / Pain Maceration: No Temperature: No Abnormality Tenderness on Palpation: Yes Wound Preparation Ulcer Cleansing: Rinsed/Irrigated with Saline, Other: soap and water, Topical Anesthetic Applied: Other: lidocaine 4%, Electronic Signature(s) Signed: 11/10/2018 4:29:26 PM By: Secundino Ginger Entered By: Secundino Ginger on 11/10/2018 16:16:04 Rickey Sherman ([id number]) -------------------------------------------------------------------------------- [place] Details Patient Name: Rickey Sherman, Rickey Sherman. Date of Service: 11/10/2018 3:30 PM Medical Record Number: [id number] Patient Account Number: [id number] Date of Birth/Sex: 1946-05-06 (73 y.o. M) Treating RN: Cornell Barman Primary Care Keirsten Matuska: Harrel Lemon Other Clinician: Referring Kamaljit Hizer: Harrel Lemon Treating Tashica Provencio/Extender: Tito Dine in Treatment: 2 Vital Signs Time Taken: 15:46 Temperature (F): 98.3 Height (in): 69 Pulse (bpm): 73 Weight (lbs): 166 Respiratory Rate (breaths/min): 16 Body Mass Index (BMI): 24.5 Blood Pressure (mmHg): 120/56 Reference Range: 80 - 120 mg / dl Electronic Signature(s) Signed:  11/10/2018 4:05:04 PM By: Lorine Bears RCP, RRT, CHT Entered By: Lorine Bears on 11/10/2018 15:49:47

## 2018-11-12 NOTE — Progress Notes (Signed)
CHUCKY, HOMES ([id number]) Visit Report for 11/10/2018 Debridement Details Patient Name: Rickey Sherman, Rickey Sherman. Date of Service: 11/10/2018 3:30 PM Medical Record Number: [id number] Patient Account Number: [id number] Date of Birth/Sex: January 08, 1946 (73 y.o. M) Treating RN: Cornell Barman Primary Care Provider: Harrel Lemon Other Clinician: Referring Provider: Harrel Lemon Treating Provider/Extender: Tito Dine in Treatment: 2 Debridement Performed for Wound #1 Left,Medial Lower Leg Assessment: Performed By: Physician Ricard Dillon, MD Debridement Type: Debridement Severity of Tissue Pre Fat layer exposed Debridement: Level of Consciousness (Pre- Awake and Alert procedure): Pre-procedure Verification/Time Yes - 16:46 Out Taken: Start Time: 16:46 Pain Control: Lidocaine Total Area Debrided (L x W): 1.6 (cm) x 0.9 (cm) = 1.44 (cm) Tissue and other material Viable, Non-Viable, Slough, Subcutaneous, Slough debrided: Level: Skin/Subcutaneous Tissue Debridement Description: Excisional Instrument: Curette Bleeding: Minimum Hemostasis Achieved: Pressure End Time: 16:47 Procedural Pain: 0 Post Procedural Pain: 0 Response to Treatment: Procedure was tolerated well Level of Consciousness Awake and Alert (Post-procedure): Post Debridement Measurements of Total Wound Length: (cm) 1.6 Width: (cm) 0.9 Depth: (cm) 0.3 Volume: (cm) 0.339 Character of Wound/Ulcer Post Debridement: Requires Further Debridement Severity of Tissue Post Debridement: Fat layer exposed Post Procedure Diagnosis Same as Pre-procedure Electronic Signature(s) Signed: 11/10/2018 5:53:07 PM By: Linton Ham MD Signed: 11/11/2018 5:43:44 PM By: Gretta Cool, BSN, RN, CWS, Kim RN, BSN Entered By: Linton Ham on 11/10/2018 17:49:07 MOUSA, PROUT ([id number]) JOSAIAH, MUHAMMED ([id number]) -------------------------------------------------------------------------------- HPI Details Patient Name: KAMAL, JURGENS. Date of Service: 11/10/2018 3:30 PM Medical Record Number: [id number] Patient Account Number: [id number] Date of Birth/Sex: [birth year] (73 y.o. M) Treating RN: Cornell Barman Primary Care Provider: Harrel Lemon Other Clinician: Referring Provider: Harrel Lemon Treating Provider/Extender: Tito Dine in Treatment: 2 History of Present Illness HPI Description: ADMISSION 10/27/2018 This is a patient to is 73 year old type II diabetic on oral agents. In the fall of this year he had progressive difficulties with chest/epigastric discomfort. 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 He underwent a CABG x4 at [facility] on November 19. Among other things he had a left greater saphenous vein harvest. He had a complicated postop course. The area on his left leg was sutured but dehisced. The sutures have since been removed. He has been left with eschar over the top. He was apparently told to "leave this open to air". He has been washing it with soap and water. He did receive a course of doxycycline from his primary doctor in mid December he is finished that now. He does not have a known history of peripheral arterial disease. He does have a history of diabetic peripheral neuropathy. ABIs in this clinic were 1.00 on the right and 1.09 on the left Past medical history includes CABG x4 at [facility] on November 14, hypertension, type 2 diabetes with peripheral neuropathy with a recent hemoglobin A1c of 7.3, cervical radicular pain, basal cell CA of the scalp. 11/03/2018; type 2 diabetes on oral agents. These are 2 wounds in a greater saphenous vein harvest site for a CABG on November 19. The more superior part of this on the surgical line is healed. He has a much better looking surface on this this week. He has been using Prisma under compression 1/22; great saphenous vein harvest site is down to a single wound. Better looking surface  although still requiring debridement. We have been using Prisma under Kerlix Coban. Change to Iodoflex under 3 layer compression. Electronic Signature(s) Signed: 11/10/2018  5:53:07 PM By: Linton Ham MD Entered By: Linton Ham on 11/10/2018 17:49:47 [name] ([id number]) -------------------------------------------------------------------------------- Physical Exam Details Patient Name: SIRRON, FRANCESCONI. Date of Service: 11/10/2018 3:30 PM Medical Record Number: [id number] Patient Account Number: [id number] Date of Birth/Sex: October 13, 1946 (73 y.o. M) Treating RN: Cornell Barman Primary Care Provider: Harrel Lemon Other Clinician: Referring Provider: Harrel Lemon Treating Provider/Extender: Tito Dine in Treatment: 2 Notes Wound exam; he still requires debridement of this area there was some eschar on the circumference of the wound and tightly adherent debris on the surface. Generally cleaning up quite nicely however I felt we needed to switch to something to help with ongoing debridement. Hemostasis with direct pressure Electronic Signature(s) Signed: 11/10/2018 5:53:07 PM By: Linton Ham MD Entered By: Linton Ham on 11/10/2018 17:51:00 [name] ([id number]) -------------------------------------------------------------------------------- Physician Orders Details Patient Name: ZACK, CRAGER. Date of Service: 11/10/2018 3:30 PM Medical Record Number: [id number] Patient Account Number: [id number] Date of Birth/Sex: Apr 15, 1946 (73 y.o. M) Treating RN: Cornell Barman Primary Care Provider: Harrel Lemon Other Clinician: Referring Provider: Harrel Lemon Treating Provider/Extender: Tito Dine in Treatment: 2 Verbal / Phone Orders: No Diagnosis Coding Wound Cleansing Wound #1 Left,Medial Lower Leg o Clean wound with Normal Saline. o May shower with protection. Anesthetic (add to Medication List) Wound #1 Left,Medial Lower Leg o Topical  Lidocaine 4% cream applied to wound bed prior to debridement (In Clinic Only). Primary Wound Dressing Wound #1 Left,Medial Lower Leg o Iodoflex Secondary Dressing Wound #1 Left,Medial Lower Leg o ABD pad Dressing Change Frequency Wound #1 Left,Medial Lower Leg o Change dressing every week Follow-up Appointments Wound #1 Left,Medial Lower Leg o Return Appointment in 1 week. Edema Control Wound #1 Left,Medial Lower Leg o 3 Layer Compression System - Left Lower Extremity Electronic Signature(s) Signed: 11/10/2018 5:53:07 PM By: Linton Ham MD Signed: 11/11/2018 5:43:44 PM By: Gretta Cool, BSN, RN, CWS, Kim RN, BSN Entered By: Gretta Cool, BSN, RN, CWS, Kim on 11/10/2018 17:01:43 CHASETON, YEPIZ ([id number]) -------------------------------------------------------------------------------- Problem List Details Patient Name: KENNIE, SNEDDEN. Date of Service: 11/10/2018 3:30 PM Medical Record Number: [id number] Patient Account Number: [id number] Date of Birth/Sex: [birth year] (73 y.o. M) Treating RN: Cornell Barman Primary Care Provider: Harrel Lemon Other Clinician: Referring Provider: Harrel Lemon Treating Provider/Extender: Tito Dine in Treatment: 2 Active Problems ICD-10 Evaluated Encounter Code Description Active Date Today Diagnosis T81.31XD Disruption of external operation (surgical) wound, not 10/27/2018 No Yes elsewhere classified, subsequent encounter L97.222 Non-pressure chronic ulcer of left calf with fat layer exposed 10/27/2018 No Yes E11.622 Type 2 diabetes mellitus with other skin ulcer 10/27/2018 No Yes Inactive Problems Resolved Problems Electronic Signature(s) Signed: 11/10/2018 5:53:07 PM By: Linton Ham MD Entered By: Linton Ham on 11/10/2018 17:48:45 [name] ([id number]) -------------------------------------------------------------------------------- Progress Note Details Patient Name: [name]. Date of Service: 11/10/2018 3:30  PM Medical Record Number: [id number] Patient Account Number: [id number] Date of Birth/Sex: [birth year] (73 y.o. M) Treating RN: Cornell Barman Primary Care Provider: Harrel Lemon Other Clinician: Referring Provider: Harrel Lemon Treating Provider/Extender: Tito Dine in Treatment: 2 Subjective History of Present Illness (HPI) ADMISSION 10/27/2018 This is a patient to is 73 year old type II diabetic on oral agents. In the fall of this year he had progressive difficulties with chest/epigastric discomfort. 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 He underwent a CABG x4 at [facility] on November 19. Among other things he had a left greater saphenous vein  harvest. He had a complicated postop course. The area on his left leg was sutured but dehisced. The sutures have since been removed. He has been left with eschar over the top. He was apparently told to "leave this open to air". He has been washing it with soap and water. He did receive a course of doxycycline from his primary doctor in mid December he is finished that now. He does not have a known history of peripheral arterial disease. He does have a history of diabetic peripheral neuropathy. ABIs in this clinic were 1.00 on the right and 1.09 on the left Past medical history includes CABG x4 at [facility] on November 14, hypertension, type 2 diabetes with peripheral neuropathy with a recent hemoglobin A1c of 7.3, cervical radicular pain, basal cell CA of the scalp. 11/03/2018; type 2 diabetes on oral agents. These are 2 wounds in a greater saphenous vein harvest site for a CABG on November 19. The more superior part of this on the surgical line is healed. He has a much better looking surface on this this week. He has been using Prisma under compression 1/22; great saphenous vein harvest site is down to a single wound. Better looking surface although still requiring debridement. We have  been using Prisma under Kerlix Coban. Change to Iodoflex under 3 layer compression. Objective Constitutional Vitals Time Taken: 3:46 PM, Height: 69 in, Weight: 166 lbs, BMI: 24.5, Temperature: 98.3 F, Pulse: 73 bpm, Respiratory Rate: 16 breaths/min, Blood Pressure: 120/56 mmHg. Integumentary (Hair, Skin) Wound #1 status is Open. Original cause of wound was Surgical Injury. The wound is located on the Left,Medial Lower Leg. The wound measures 1.6cm length x 0.9cm width x 0.3cm depth; 1.131cm^2 area and 0.339cm^3 volume. There is Fat Layer (Subcutaneous Tissue) Exposed exposed. There is no tunneling or undermining noted. There is a medium amount of serous drainage noted. The wound margin is flat and intact. There is small (1-33%) pale granulation within the wound bed. There is a large (67-100%) amount of necrotic tissue within the wound bed including Eschar and Adherent Slough. The periwound skin appearance exhibited: Dry/Scaly. The periwound skin appearance did not exhibit: Callus, Crepitus, Excoriation, Induration, Rash, Scarring, Maceration, Atrophie Blanche, Cyanosis, Ecchymosis, Hemosiderin Staining, Mottled, Pallor, Rubor, SAKET, HELLSTROM R. ([id number]) Erythema. Periwound temperature was noted as No Abnormality. The periwound has tenderness on palpation. Assessment Active Problems ICD-10 Disruption of external operation (surgical) wound, not elsewhere classified, subsequent encounter Non-pressure chronic ulcer of left calf with fat layer exposed Type 2 diabetes mellitus with other skin ulcer Procedures Wound #1 Pre-procedure diagnosis of Wound #1 is a Diabetic Wound/Ulcer of the Lower Extremity located on the Left,Medial Lower Leg .Severity of Tissue Pre Debridement is: Fat layer exposed. There was a Excisional Skin/Subcutaneous Tissue Debridement with a total area of 1.44 sq cm performed by Ricard Dillon, MD. With the following instrument(s): Curette to remove Viable and Non-Viable  tissue/material. Material removed includes Subcutaneous Tissue and Slough and after achieving pain control using Lidocaine. No specimens were taken. A time out was conducted at 16:46, prior to the start of the procedure. A Minimum amount of bleeding was controlled with Pressure. The procedure was tolerated well with a pain level of 0 throughout and a pain level of 0 following the procedure. Post Debridement Measurements: 1.6cm length x 0.9cm width x 0.3cm depth; 0.339cm^3 volume. Character of Wound/Ulcer Post Debridement requires further debridement. Severity of Tissue Post Debridement is: Fat layer exposed. Post procedure Diagnosis Wound #1: Same as Pre-Procedure Plan  Wound Cleansing: Wound #1 Left,Medial Lower Leg: Clean wound with Normal Saline. May shower with protection. Anesthetic (add to Medication List): Wound #1 Left,Medial Lower Leg: Topical Lidocaine 4% cream applied to wound bed prior to debridement (In Clinic Only). Primary Wound Dressing: Wound #1 Left,Medial Lower Leg: Iodoflex Secondary Dressing: Wound #1 Left,Medial Lower Leg: ABD pad Dressing Change Frequency: Wound #1 Left,Medial Lower Leg: Change dressing every week Follow-up Appointments: LARON, BOORMAN ([id number]) Wound #1 Left,Medial Lower Leg: Return Appointment in 1 week. Edema Control: Wound #1 Left,Medial Lower Leg: 3 Layer Compression System - Left Lower Extremity 1. Iodoflex/ABDs and I have increased to 3 layer compression Electronic Signature(s) Signed: 11/10/2018 5:53:07 PM By: Linton Ham MD Entered By: Linton Ham on 11/10/2018 17:51:52 Estes, Lehner [place] ([id number]) -------------------------------------------------------------------------------- [place] Details Patient Name: MERYL, PONDER. Date of Service: 11/10/2018 Medical Record Number: [id number] Patient Account Number: [id number] Date of Birth/Sex: January 29, 1946 (73 y.o. M) Treating RN: Cornell Barman Primary Care Provider: Harrel Lemon  Other Clinician: Referring Provider: Harrel Lemon Treating Provider/Extender: Tito Dine in Treatment: 2 Diagnosis Coding ICD-10 Codes Code Description T81.31XD Disruption of external operation (surgical) wound, not elsewhere classified, subsequent encounter L97.222 Non-pressure chronic ulcer of left calf with fat layer exposed E11.622 Type 2 diabetes mellitus with other skin ulcer Facility Procedures CPT4 Code: [id number] Description: 85027 - DEB SUBQ TISSUE 20 SQ CM/< ICD-10 Diagnosis Description L97.222 Non-pressure chronic ulcer of left calf with fat layer expo Modifier: sed Quantity: 1 Physician Procedures CPT4 Code: [id number] Description: 67672 - WC PHYS SUBQ TISS 20 SQ CM ICD-10 Diagnosis Description L97.222 Non-pressure chronic ulcer of left calf with fat layer expo Modifier: sed Quantity: 1 Electronic Signature(s) Signed: 11/10/2018 5:53:07 PM By: Linton Ham MD Entered By: Linton Ham on 11/10/2018 17:52:10

## 2018-11-15 ENCOUNTER — Encounter: Payer: Medicare Other | Admitting: *Deleted

## 2018-11-15 DIAGNOSIS — E11621 Type 2 diabetes mellitus with foot ulcer: Secondary | ICD-10-CM | POA: Diagnosis not present

## 2018-11-15 DIAGNOSIS — Z951 Presence of aortocoronary bypass graft: Secondary | ICD-10-CM

## 2018-11-15 NOTE — Progress Notes (Signed)
Daily Session Note  Patient Details  Name: Rickey Sherman MRN: [id number] Date of Birth: [birth year] Referring Provider:     Cardiac Rehab from 10/28/2018 in [facility]  Referring Provider  Edwina Barth      Encounter Date: 11/15/2018  Check In: Session Check In - 11/15/18 0752      Check-In   Supervising physician immediately available to respond to emergencies  See telemetry face sheet for immediately available ER MD    Location  [facility]    Staff Present  Constance Goltz, RN BSN;Marlina Cataldi Luan Pulling, MA, RCEP, CCRP, Exercise Physiologist;Kelly Amedeo Plenty, BS, ACSM CEP, Exercise Physiologist    Medication changes reported      No    Fall or balance concerns reported     No    Warm-up and Cool-down  Performed as group-led instruction    Resistance Training Performed  Yes    VAD Patient?  No    PAD/SET Patient?  No      Pain Assessment   Currently in Pain?  No/denies          Social History   Tobacco Use  Smoking Status Never Smoker  Smokeless Tobacco Never Used    Goals Met:  Independence with exercise equipment Exercise tolerated well No report of cardiac concerns or symptoms Strength training completed today  Goals Unmet:  Not Applicable  Comments: Pt able to follow exercise prescription today without complaint.  Will continue to monitor for progression.    Dr. Emily Filbert is Medical Director for [facility].

## 2018-11-17 ENCOUNTER — Encounter: Payer: Medicare Other | Admitting: Internal Medicine

## 2018-11-17 DIAGNOSIS — E11621 Type 2 diabetes mellitus with foot ulcer: Secondary | ICD-10-CM | POA: Diagnosis not present

## 2018-11-17 DIAGNOSIS — Z951 Presence of aortocoronary bypass graft: Secondary | ICD-10-CM

## 2018-11-17 NOTE — Progress Notes (Signed)
Cardiac Individual Treatment Plan  Patient Details  Name: Rickey Sherman MRN: [id number] Date of Birth: Jan 03, 1946 Referring Provider:     Cardiac Rehab from 10/28/2018 in [facility] Cardiac and Pulmonary Rehab  Referring Provider  Edwina Barth      Initial Encounter Date:    Cardiac Rehab from 10/28/2018 in [facility] Cardiac and Pulmonary Rehab  Date  10/28/18      Visit Diagnosis: S/P CABG x 4  Patient's Home Medications on Admission:  Current Outpatient Medications:  .  acetaminophen (TYLENOL) 500 MG tablet, Take 1,000 mg by mouth daily as needed for moderate pain or headache., Disp: , Rfl:  .  allopurinol (ZYLOPRIM) 100 MG tablet, Take 100 mg by mouth every evening., Disp: , Rfl:  .  allopurinol (ZYLOPRIM) 100 MG tablet, TAKE 1 TABLET BY MOUTH ONCE DAILY, Disp: , Rfl:  .  aspirin EC 81 MG tablet, Take 81 mg by mouth every evening., Disp: , Rfl:  .  atorvastatin (LIPITOR) 80 MG tablet, Take by mouth., Disp: , Rfl:  .  B-D ULTRA-FINE 33 LANCETS MISC, Inject into the skin., Disp: , Rfl:  .  Blood Glucose Monitoring Suppl (ONE TOUCH ULTRA 2) w/Device KIT, Use as instructed., Disp: , Rfl:  .  empagliflozin (JARDIANCE) 25 MG TABS tablet, Take by mouth., Disp: , Rfl:  .  fluorouracil (EFUDEX) 5 % cream, Apply 1 application topically daily as needed (skin spots). , Disp: , Rfl: 1 .  glipiZIDE (GLUCOTROL) 10 MG tablet, TAKE 1 TABLET BY MOUTH TWO  TIMES DAILY BEFORE MEALS, Disp: , Rfl:  .  glucose blood (ONE TOUCH ULTRA TEST) test strip, Use once daily Use as instructed., Disp: , Rfl:  .  ibuprofen (ADVIL,MOTRIN) 200 MG tablet, Take 400 mg by mouth daily as needed for moderate pain., Disp: , Rfl:  .  isosorbide mononitrate (IMDUR) 30 MG 24 hr tablet, Take 30 mg by mouth daily., Disp: , Rfl:  .  isosorbide mononitrate (IMDUR) 30 MG 24 hr tablet, Take by mouth., Disp: , Rfl:  .  Lancets (ONETOUCH DELICA PLUS [id number]) MISC, , Disp: , Rfl:  .  losartan (COZAAR) 100 MG tablet, Take 100 mg by mouth daily.,  Disp: , Rfl:  .  losartan (COZAAR) 100 MG tablet, TAKE 1 TABLET BY MOUTH ONCE DAILY, Disp: , Rfl:  .  metFORMIN (GLUCOPHAGE) 1000 MG tablet, Take 1,000 mg by mouth 2 (two) times daily., Disp: , Rfl:  .  metoprolol succinate (TOPROL-XL) 25 MG 24 hr tablet, Take 25 mg by mouth daily., Disp: , Rfl:  .  metoprolol succinate (TOPROL-XL) 25 MG 24 hr tablet, Take by mouth., Disp: , Rfl:  .  metoprolol tartrate (LOPRESSOR) 25 MG tablet, Take by mouth 2 (two) times daily. , Disp: , Rfl:  .  omeprazole (PRILOSEC) 40 MG capsule, TAKE 1 CAPSULE BY MOUTH  ONCE A DAY, Disp: , Rfl:  .  pioglitazone (ACTOS) 15 MG tablet, Take 15 mg by mouth every evening., Disp: , Rfl:  .  pioglitazone (ACTOS) 15 MG tablet, Take by mouth., Disp: , Rfl:  .  tamsulosin (FLOMAX) 0.4 MG CAPS capsule, Take by mouth., Disp: , Rfl:  .  vitamin B-12 (CYANOCOBALAMIN) 1000 MCG tablet, Take by mouth., Disp: , Rfl:   Past Medical History: Past Medical History:  Diagnosis Date  . Cancer ([place])    basal cell carcinoma  . Coronary artery disease   . Diabetes mellitus without complication ([place])   . Hypertension     Tobacco Use:  Social History   Tobacco Use  Smoking Status Never Smoker  Smokeless Tobacco Never Used    Labs: Recent Review Flowsheet Data    There is no flowsheet data to display.       Exercise Target Goals: Exercise Program Goal: Individual exercise prescription set using results from initial 6 min walk test and THRR while considering  patient's activity barriers and safety.   Exercise Prescription Goal: Initial exercise prescription builds to 30-45 minutes a day of aerobic activity, 2-3 days per week.  Home exercise guidelines will be given to patient during program as part of exercise prescription that the participant will acknowledge.  Activity Barriers & Risk Stratification: Activity Barriers & Cardiac Risk Stratification - 10/28/18 1532      Activity Barriers & Cardiac Risk Stratification   Activity  Barriers  Other (comment)    Comments  leg incision still healing    Cardiac Risk Stratification  High       6 Minute Walk: 6 Minute Walk    Row Name 10/28/18 1502         6 Minute Walk   Phase  Initial     Distance  1520 feet     Walk Time  6 minutes     # of Rest Breaks  0     MPH  2.88     METS  3.93     RPE  12     Perceived Dyspnea   0     VO2 Peak  13.8     Symptoms  No     Resting HR  63 bpm     Resting BP  110/58     Resting Oxygen Saturation   99 %     Exercise Oxygen Saturation  during 6 min walk  99 %     Max Ex. HR  117 bpm     Max Ex. BP  156/58     2 Minute Post BP  126/58        Oxygen Initial Assessment:   Oxygen Re-Evaluation:   Oxygen Discharge (Final Oxygen Re-Evaluation):   Initial Exercise Prescription: Initial Exercise Prescription - 10/28/18 1500      Date of Initial Exercise RX and Referring Provider   Date  10/28/18    Referring Provider  Edwina Barth      Treadmill   MPH  2.8    Grade  1.5    Minutes  15    METs  3.7      Recumbant Bike   Level  4    RPM  60    Watts  46    Minutes  15    METs  3.7      REL-XR   Level  3    Speed  50    Minutes  15    METs  3.7      Prescription Details   Frequency (times per week)  3    Duration  Progress to 45 minutes of aerobic exercise without signs/symptoms of physical distress      Intensity   THRR 40-80% of Max Heartrate  97-132    Ratings of Perceived Exertion  11-13    Perceived Dyspnea  0-4      Resistance Training   Training Prescription  Yes    Weight  3 lb    Reps  10-15       Perform Capillary Blood Glucose checks as needed.  Exercise Prescription Changes: Exercise Prescription Changes  Row Name 10/28/18 1500 11/10/18 0900           Response to Exercise   Blood Pressure (Admit)  110/58  122/60      Blood Pressure (Exercise)  156/58  138/52      Blood Pressure (Exit)  126/58  132/70      Heart Rate (Admit)  64 bpm  67 bpm      Heart Rate (Exercise)   107 bpm  129 bpm      Heart Rate (Exit)  71 bpm  81 bpm      Oxygen Saturation (Admit)  99 %  -      Oxygen Saturation (Exit)  99 %  -      Rating of Perceived Exertion (Exercise)  12  15      Symptoms  -  none      Duration  -  Continue with 45 min of aerobic exercise without signs/symptoms of physical distress.      Intensity  -  THRR unchanged        Progression   Progression  -  Continue to progress workloads to maintain intensity without signs/symptoms of physical distress.      Average METs  -  4.07        Resistance Training   Training Prescription  -  Yes      Weight  -  3 lbs      Reps  -  10-15        Interval Training   Interval Training  -  No        Treadmill   MPH  -  2.8      Grade  -  1      Minutes  -  15      METs  -  3.53        Recumbant Bike   Level  -  2      Watts  -  36      Minutes  -  15      METs  -  3.47        REL-XR   Level  -  3      Minutes  -  15      METs  -  5.2        Home Exercise Plan   Plans to continue exercise at  -  Home (comment) walking and weights      Frequency  -  Add 2 additional days to program exercise sessions.      Initial Home Exercises Provided  -  11/10/18         Exercise Comments: Exercise Comments    Row Name 11/01/18 0849           Exercise Comments  First full day of exercise!  Patient was oriented to gym and equipment including functions, settings, policies, and procedures.  Patient's individual exercise prescription and treatment plan were reviewed.  All starting workloads were established based on the results of the 6 minute walk test done at initial orientation visit.  The plan for exercise progression was also introduced and progression will be customized based on patient's performance and goals.          Exercise Goals and Review: Exercise Goals    Row Name 10/28/18 1501             Exercise Goals   Increase Physical Activity  Yes  Intervention  Provide advice, education, support  and counseling about physical activity/exercise needs.;Develop an individualized exercise prescription for aerobic and resistive training based on initial evaluation findings, risk stratification, comorbidities and participant's personal goals.       Expected Outcomes  Short Term: Attend rehab on a regular basis to increase amount of physical activity.;Long Term: Add in home exercise to make exercise part of routine and to increase amount of physical activity.;Long Term: Exercising regularly at least 3-5 days a week.       Increase Strength and Stamina  Yes       Intervention  Provide advice, education, support and counseling about physical activity/exercise needs.;Develop an individualized exercise prescription for aerobic and resistive training based on initial evaluation findings, risk stratification, comorbidities and participant's personal goals.       Expected Outcomes  Short Term: Increase workloads from initial exercise prescription for resistance, speed, and METs.;Short Term: Perform resistance training exercises routinely during rehab and add in resistance training at home;Long Term: Improve cardiorespiratory fitness, muscular endurance and strength as measured by increased METs and functional capacity (6MWT)       Able to understand and use rate of perceived exertion (RPE) scale  Yes       Intervention  Provide education and explanation on how to use RPE scale       Expected Outcomes  Short Term: Able to use RPE daily in rehab to express subjective intensity level;Long Term:  Able to use RPE to guide intensity level when exercising independently       Knowledge and understanding of Target Heart Rate Range (THRR)  Yes       Intervention  Provide education and explanation of THRR including how the numbers were predicted and where they are located for reference       Expected Outcomes  Short Term: Able to state/look up THRR;Short Term: Able to use daily as guideline for intensity in rehab;Long  Term: Able to use THRR to govern intensity when exercising independently       Able to check pulse independently  Yes       Intervention  Provide education and demonstration on how to check pulse in carotid and radial arteries.;Review the importance of being able to check your own pulse for safety during independent exercise       Expected Outcomes  Short Term: Able to explain why pulse checking is important during independent exercise;Long Term: Able to check pulse independently and accurately       Understanding of Exercise Prescription  Yes       Intervention  Provide education, explanation, and written materials on patient's individual exercise prescription       Expected Outcomes  Short Term: Able to explain program exercise prescription;Long Term: Able to explain home exercise prescription to exercise independently          Exercise Goals Re-Evaluation : Exercise Goals Re-Evaluation    Row Name 11/01/18 0849 11/10/18 0908           Exercise Goal Re-Evaluation   Exercise Goals Review  Increase Physical Activity;Increase Strength and Stamina;Able to understand and use rate of perceived exertion (RPE) scale;Knowledge and understanding of Target Heart Rate Range (THRR);Understanding of Exercise Prescription  Increase Physical Activity;Increase Strength and Stamina;Able to understand and use rate of perceived exertion (RPE) scale;Knowledge and understanding of Target Heart Rate Range (THRR);Able to check pulse independently;Understanding of Exercise Prescription      Comments  Reviewed RPE scale, THR and program prescription  with pt today.  Pt voiced understanding and was given a copy of goals to take home.   Reviewed home exercise with pt today.  Pt plans to walk and use weights at home for exercise.  Reviewed THR, pulse, RPE, sign and symptoms, and when to call 911 or MD.  Also discussed weather considerations and indoor options.  Pt voiced understanding.      Expected Outcomes  Short: Use RPE  daily to regulate intensity. Long: Follow program prescription in THR.  Short: Start to add in more walking at home.  Long: Continue to add in exercise at home.         Discharge Exercise Prescription (Final Exercise Prescription Changes): Exercise Prescription Changes - 11/10/18 0900      Response to Exercise   Blood Pressure (Admit)  122/60    Blood Pressure (Exercise)  138/52    Blood Pressure (Exit)  132/70    Heart Rate (Admit)  67 bpm    Heart Rate (Exercise)  129 bpm    Heart Rate (Exit)  81 bpm    Rating of Perceived Exertion (Exercise)  15    Symptoms  none    Duration  Continue with 45 min of aerobic exercise without signs/symptoms of physical distress.    Intensity  THRR unchanged      Progression   Progression  Continue to progress workloads to maintain intensity without signs/symptoms of physical distress.    Average METs  4.07      Resistance Training   Training Prescription  Yes    Weight  3 lbs    Reps  10-15      Interval Training   Interval Training  No      Treadmill   MPH  2.8    Grade  1    Minutes  15    METs  3.53      Recumbant Bike   Level  2    Watts  36    Minutes  15    METs  3.47      REL-XR   Level  3    Minutes  15    METs  5.2      Home Exercise Plan   Plans to continue exercise at  Home (comment)   walking and weights   Frequency  Add 2 additional days to program exercise sessions.    Initial Home Exercises Provided  11/10/18       Nutrition:  Target Goals: Understanding of nutrition guidelines, daily intake of sodium <1573m, cholesterol <2078m calories 30% from fat and 7% or less from saturated fats, daily to have 5 or more servings of fruits and vegetables.  Biometrics: Pre Biometrics - 10/28/18 1500      Pre Biometrics   Height  5' 10.5" (1.791 m)    Weight  165 lb (74.8 kg)    Waist Circumference  37 inches    Hip Circumference  38.5 inches    Waist to Hip Ratio  0.96 %    BMI (Calculated)  23.33    Single Leg  Stand  25 seconds        Nutrition Therapy Plan and Nutrition Goals: Nutrition Therapy & Goals - 10/28/18 1529      Intervention Plan   Intervention  Prescribe, educate and counsel regarding individualized specific dietary modifications aiming towards targeted core components such as weight, hypertension, lipid management, diabetes, heart failure and other comorbidities.;Nutrition handout(s) given to patient.    Expected  Outcomes  Short Term Goal: Understand basic principles of dietary content, such as calories, fat, sodium, cholesterol and nutrients.;Short Term Goal: A plan has been developed with personal nutrition goals set during dietitian appointment.;Long Term Goal: Adherence to prescribed nutrition plan.       Nutrition Assessments: Nutrition Assessments - 10/28/18 1529      MEDFICTS Scores   Pre Score  60       Nutrition Goals Re-Evaluation:   Nutrition Goals Discharge (Final Nutrition Goals Re-Evaluation):   Psychosocial: Target Goals: Acknowledge presence or absence of significant depression and/or stress, maximize coping skills, provide positive support system. Participant is able to verbalize types and ability to use techniques and skills needed for reducing stress and depression.   Initial Review & Psychosocial Screening: Initial Psych Review & Screening - 10/28/18 1526      Initial Review   Current issues with  Current Stress Concerns    Source of Stress Concerns  Unable to participate in former interests or hobbies;Unable to perform yard/household activities    Comments  He is almost 2 months post CABG. 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 He is used to playing golf about 3 x a week and lives a social active lifestyle.       Family Dynamics   Good Support System?  Yes   Life partner Parke Simmers)     Barriers   Psychosocial barriers to participate in program  There are no identifiable barriers or psychosocial  needs.;The patient should benefit from training in stress management and relaxation.      Screening Interventions   Interventions  Encouraged to exercise;Program counselor consult;To provide support and resources with identified psychosocial needs;Provide feedback about the scores to participant    Expected Outcomes  Short Term goal: Utilizing psychosocial counselor, staff and physician to assist with identification of specific Stressors or current issues interfering with healing process. Setting desired goal for each stressor or current issue identified.;Long Term Goal: Stressors or current issues are controlled or eliminated.;Short Term goal: Identification and review with participant of any Quality of Life or Depression concerns found by scoring the questionnaire.;Long Term goal: The participant improves quality of Life and PHQ9 Scores as seen by post scores and/or verbalization of changes       Quality of Life Scores:  Quality of Life - 10/28/18 1529      Quality of Life   Select  Quality of Life      Quality of Life Scores   Health/Function Pre  26.8 %    Socioeconomic Pre  28.63 %    Psych/Spiritual Pre  26.57 %    Family Pre  28.8 %    GLOBAL Pre  27.46 %      Scores of 19 and below usually indicate a poorer quality of life in these areas.  A difference of  2-3 points is a clinically meaningful difference.  A difference of 2-3 points in the total score of the Quality of Life Index has been associated with significant improvement in overall quality of life, self-image, physical symptoms, and general health in studies assessing change in quality of life.  PHQ-9: Recent Review Flowsheet Data    Depression screen [facility] 2/9 10/28/2018   Decreased Interest 0   Down, Depressed, Hopeless 0   PHQ - 2 Score 0   Altered sleeping 1   Tired, decreased energy 0   Change in appetite 0  Feeling bad or failure about yourself  1   Trouble concentrating 0   Moving slowly or fidgety/restless 0    Suicidal thoughts 0   PHQ-9 Score 2   Difficult doing work/chores Not difficult at all     Interpretation of Total Score  Total Score Depression Severity:  1-4 = Minimal depression, 5-9 = Mild depression, 10-14 = Moderate depression, 15-19 = Moderately severe depression, 20-27 = Severe depression   Psychosocial Evaluation and Intervention: Psychosocial Evaluation - 11/01/18 0927      Psychosocial Evaluation & Interventions   Interventions  Stress management education;Encouraged to exercise with the program and follow exercise prescription    Comments  Counselor met with Mr. Whidbee Petitjean) today for initial psychosocial evaluation.  He is a 73 year old who had a CABGx4 on 11/12.  He also is a diabetic.  Liam has a strong support system with a partner of 30 years; neighbors and a host of golf friends.  He reports sleeping well and his appetite has improved recently since the surgery.  He denies a history of depression or anxiety or any current symptoms and is typically in a positive mood.  His primary stress is his health and the recovery of his leg that is not healing well from surgery.  He has seen a wound specialist and feels confident it is improving now.  Kingstin has goals to return to playing golf 3x/week and improve his stamina and strength.  Staff will follow with Jeneen Rinks throughout the course of this program.      Expected Outcomes  Short:  Tuff will exercise consistently to increase his stamina and strength.  He will attend the educational components of this program in order to manage the stress in his life more positively.  Long:  Caydon will get back in the routine of consistent exercise in order to return to the golf course 3x/week, upon Dr's approval.    Continue Psychosocial Services   Follow up required by staff       Psychosocial Re-Evaluation:   Psychosocial Discharge (Final Psychosocial Re-Evaluation):   Vocational Rehabilitation: Provide vocational rehab assistance to  qualifying candidates.   Vocational Rehab Evaluation & Intervention: Vocational Rehab - 10/28/18 1526      Initial Vocational Rehab Evaluation & Intervention   Assessment shows need for Vocational Rehabilitation  No       Education: Education Goals: Education classes will be provided on a variety of topics geared toward better understanding of heart health and risk factor modification. Participant will state understanding/return demonstration of topics presented as noted by education test scores.  Learning Barriers/Preferences: Learning Barriers/Preferences - 10/28/18 1525      Learning Barriers/Preferences   Learning Barriers  None    Learning Preferences  None       Education Topics:  AED/CPR: - Group verbal and written instruction with the use of models to demonstrate the basic use of the AED with the basic ABC's of resuscitation.   General Nutrition Guidelines/Fats and Fiber: -Group instruction provided by verbal, written material, models and posters to present the general guidelines for heart healthy nutrition. Gives an explanation and review of dietary fats and fiber.   Cardiac Rehab from 11/17/2018 in [facility] Cardiac and Pulmonary Rehab  Date  11/01/18  Educator  LB  Instruction Review Code  1- Verbalizes Understanding      Controlling Sodium/Reading Food Labels: -Group verbal and written material supporting the discussion of sodium use in heart healthy nutrition. Review and explanation with  models, verbal and written materials for utilization of the food label.   Cardiac Rehab from 11/17/2018 in [facility] Cardiac and Pulmonary Rehab  Date  11/03/18  Educator  LB  Instruction Review Code  1- Verbalizes Understanding      Exercise Physiology & General Exercise Guidelines: - Group verbal and written instruction with models to review the exercise physiology of the cardiovascular system and associated critical values. Provides general exercise guidelines with specific  guidelines to those with heart or lung disease.    Cardiac Rehab from 11/17/2018 in [facility] Cardiac and Pulmonary Rehab  Date  11/08/18  Educator  [facility]  Instruction Review Code  1- Verbalizes Understanding      Aerobic Exercise & Resistance Training: - Gives group verbal and written instruction on the various components of exercise. Focuses on aerobic and resistive training programs and the benefits of this training and how to safely progress through these programs..   Cardiac Rehab from 11/17/2018 in [facility] Cardiac and Pulmonary Rehab  Date  11/10/18  Educator  [facility]  Instruction Review Code  1- Verbalizes Understanding      Flexibility, Balance, Mind/Body Relaxation: Provides group verbal/written instruction on the benefits of flexibility and balance training, including mind/body exercise modes such as yoga, pilates and tai chi.  Demonstration and skill practice provided.   Cardiac Rehab from 11/17/2018 in [facility] Cardiac and Pulmonary Rehab  Date  11/15/18  Educator  [facility]  Instruction Review Code  1- Verbalizes Understanding      Stress and Anxiety: - Provides group verbal and written instruction about the health risks of elevated stress and causes of high stress.  Discuss the correlation between heart/lung disease and anxiety and treatment options. Review healthy ways to manage with stress and anxiety.   Depression: - Provides group verbal and written instruction on the correlation between heart/lung disease and depressed mood, treatment options, and the stigmas associated with seeking treatment.   Anatomy & Physiology of the Heart: - Group verbal and written instruction and models provide basic cardiac anatomy and physiology, with the coronary electrical and arterial systems. Review of Valvular disease and Heart Failure   Cardiac Procedures: - Group verbal and written instruction to review commonly prescribed medications for heart disease. Reviews the medication, class of the drug, and side  effects. Includes the steps to properly store meds and maintain the prescription regimen. (beta blockers and nitrates)   Cardiac Medications I: - Group verbal and written instruction to review commonly prescribed medications for heart disease. Reviews the medication, class of the drug, and side effects. Includes the steps to properly store meds and maintain the prescription regimen.   Cardiac Medications II: -Group verbal and written instruction to review commonly prescribed medications for heart disease. Reviews the medication, class of the drug, and side effects. (all other drug classes)   Cardiac Rehab from 11/17/2018 in [facility] Cardiac and Pulmonary Rehab  Date  11/17/18  Educator  [facility]  Instruction Review Code  1- Verbalizes Understanding       Go Sex-Intimacy & Heart Disease, Get SMART - Goal Setting: - Group verbal and written instruction through game format to discuss heart disease and the return to sexual intimacy. Provides group verbal and written material to discuss and apply goal setting through the application of the S.M.A.R.T. Method.   Other Matters of the Heart: - Provides group verbal, written materials and models to describe Stable Angina and Peripheral Artery. Includes description of the disease process and treatment options available to the cardiac  patient.   Exercise & Equipment Safety: - Individual verbal instruction and demonstration of equipment use and safety with use of the equipment.   Cardiac Rehab from 11/17/2018 in [facility] Cardiac and Pulmonary Rehab  Date  10/28/18  Educator  [facility]  Instruction Review Code  1- Verbalizes Understanding      Infection Prevention: - Provides verbal and written material to individual with discussion of infection control including proper hand washing and proper equipment cleaning during exercise session.   Cardiac Rehab from 11/17/2018 in [facility] Cardiac and Pulmonary Rehab  Date  10/28/18  Educator  [facility]  Instruction Review Code  1-  Verbalizes Understanding      Falls Prevention: - Provides verbal and written material to individual with discussion of falls prevention and safety.   Cardiac Rehab from 11/17/2018 in [facility] Cardiac and Pulmonary Rehab  Date  10/28/18  Educator  [facility]  Instruction Review Code  1- Verbalizes Understanding      Diabetes: - Individual verbal and written instruction to review signs/symptoms of diabetes, desired ranges of glucose level fasting, after meals and with exercise. Acknowledge that pre and post exercise glucose checks will be done for 3 sessions at entry of program.   Cardiac Rehab from 11/17/2018 in [facility] Cardiac and Pulmonary Rehab  Date  10/28/18  Educator  [facility]  Instruction Review Code  1- Verbalizes Understanding      Know Your Numbers and Risk Factors: -Group verbal and written instruction about important numbers in your health.  Discussion of what are risk factors and how they play a role in the disease process.  Review of Cholesterol, Blood Pressure, Diabetes, and BMI and the role they play in your overall health.   Cardiac Rehab from 11/17/2018 in [facility] Cardiac and Pulmonary Rehab  Date  11/17/18  Educator  [facility]  Instruction Review Code  1- Verbalizes Understanding      Sleep Hygiene: -Provides group verbal and written instruction about how sleep can affect your health.  Define sleep hygiene, discuss sleep cycles and impact of sleep habits. Review good sleep hygiene tips.    Other: -Provides group and verbal instruction on various topics (see comments)   Knowledge Questionnaire Score: Knowledge Questionnaire Score - 10/28/18 1526      Knowledge Questionnaire Score   Pre Score  26/26       Core Components/Risk Factors/Patient Goals at Admission: Personal Goals and Risk Factors at Admission - 10/28/18 1523      Core Components/Risk Factors/Patient Goals on Admission    Weight Management  Yes;Weight Maintenance    Intervention  Weight Management: Develop a combined  nutrition and exercise program designed to reach desired caloric intake, while maintaining appropriate intake of nutrient and fiber, sodium and fats, and appropriate energy expenditure required for the weight goal.;Weight Management: Provide education and appropriate resources to help participant work on and attain dietary goals.    Admit Weight  165 lb (74.8 kg)    Expected Outcomes  Short Term: Continue to assess and modify interventions until short term weight is achieved;Long Term: Adherence to nutrition and physical activity/exercise program aimed toward attainment of established weight goal;Weight Maintenance: Understanding of the daily nutrition guidelines, which includes 25-35% calories from fat, 7% or less cal from saturated fats, less than 290m cholesterol, less than 1.5gm of sodium, & 5 or more servings of fruits and vegetables daily;Understanding recommendations for meals to include 15-35% energy as protein, 25-35% energy from fat, 35-60% energy from carbohydrates, less than 2045mof dietary cholesterol,  20-35 gm of total fiber daily;Understanding of distribution of calorie intake throughout the day with the consumption of 4-5 meals/snacks    Diabetes  Yes    Intervention  Provide education about signs/symptoms and action to take for hypo/hyperglycemia.;Provide education about proper nutrition, including hydration, and aerobic/resistive exercise prescription along with prescribed medications to achieve blood glucose in normal ranges: Fasting glucose 65-99 mg/dL    Expected Outcomes  Long Term: Attainment of HbA1C < 7%.    Hypertension  Yes    Intervention  Provide education on lifestyle modifcations including regular physical activity/exercise, weight management, moderate sodium restriction and increased consumption of fresh fruit, vegetables, and low fat dairy, alcohol moderation, and smoking cessation.;Monitor prescription use compliance.    Expected Outcomes  Short Term: Continued assessment  and intervention until BP is < 140/90m HG in hypertensive participants. < 130/850mHG in hypertensive participants with diabetes, heart failure or chronic kidney disease.;Long Term: Maintenance of blood pressure at goal levels.    Lipids  Yes    Intervention  Provide education and support for participant on nutrition & aerobic/resistive exercise along with prescribed medications to achieve LDL <706mHDL >14m59m  Expected Outcomes  Short Term: Participant states understanding of desired cholesterol values and is compliant with medications prescribed. Participant is following exercise prescription and nutrition guidelines.;Long Term: Cholesterol controlled with medications as prescribed, with individualized exercise RX and with personalized nutrition plan. Value goals: LDL < 70mg57mL > 40 mg.       Core Components/Risk Factors/Patient Goals Review:    Core Components/Risk Factors/Patient Goals at Discharge (Final Review):    ITP Comments: ITP Comments    Row Name 10/28/18 1507 11/17/18 0915         ITP Comments  Med Review completed. Initial ITP created. Diagnosis can be found in Care Everywhere 11/12  30 Day Review. Continue with ITP unless directed changes per Medical Director review.         Comments: 30 day review

## 2018-11-17 NOTE — Progress Notes (Signed)
Daily Session Note  Patient Details  Name: Rickey Sherman MRN: [id number] Date of Birth: [birth year] Referring Provider:     Cardiac Rehab from 10/28/2018 in [facility] Cardiac and Pulmonary Rehab  Referring Provider  Edwina Barth      Encounter Date: 11/17/2018  Check In: Session Check In - 11/17/18 [place]      Check-In   Supervising physician immediately available to respond to emergencies  See telemetry face sheet for immediately available ER MD    Location  [facility]-Cardiac & Pulmonary Rehab    Staff Present  Vida Rigger RN, BSN;Jessica Luan Pulling, MA, RCEP, CCRP, Exercise Physiologist;Tien Spooner Tessie Fass RCP,RRT,BSRT    Medication changes reported      No    Fall or balance concerns reported     No    Warm-up and Cool-down  Performed as group-led instruction    Resistance Training Performed  Yes    VAD Patient?  No    PAD/SET Patient?  No      Pain Assessment   Currently in Pain?  No/denies          Social History   Tobacco Use  Smoking Status Never Smoker  Smokeless Tobacco Never Used    Goals Met:  Independence with exercise equipment Exercise tolerated well No report of cardiac concerns or symptoms Strength training completed today  Goals Unmet:  Not Applicable  Comments: Pt able to follow exercise prescription today without complaint.  Will continue to monitor for progression.\    Dr. Emily Filbert is Medical Director for [place] and [facility].

## 2018-11-19 ENCOUNTER — Encounter: Payer: Medicare Other | Admitting: *Deleted

## 2018-11-19 DIAGNOSIS — Z951 Presence of aortocoronary bypass graft: Secondary | ICD-10-CM

## 2018-11-19 DIAGNOSIS — E11621 Type 2 diabetes mellitus with foot ulcer: Secondary | ICD-10-CM | POA: Diagnosis not present

## 2018-11-19 NOTE — Progress Notes (Signed)
BAILEN, GEFFRE ([id number]) Visit Report for 11/17/2018 HPI Details Patient Name: Rickey Sherman, Rickey Sherman. Date of Service: 11/17/2018 1:30 PM Medical Record Number: [id number] Patient Account Number: [id number] Date of Birth/Sex: 1946/08/18 (73 y.o. M) Treating RN: Primary Care Provider: Harrel Lemon Other Clinician: Referring Provider: Harrel Lemon Treating Provider/Extender: Tito Dine in Treatment: 3 History of Present Illness HPI Description: ADMISSION 10/27/2018 This is a patient to is 73 year old type II diabetic on oral agents. In the fall of this year he had progressive difficulties with chest/epigastric discomfort. 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 He underwent a CABG x4 at [facility] on November 19. Among other things he had a left greater saphenous vein harvest. He had a complicated postop course. The area on his left leg was sutured but dehisced. The sutures have since been removed. He has been left with eschar over the top. He was apparently told to "leave this open to air". He has been washing it with soap and water. He did receive a course of doxycycline from his primary doctor in mid December he is finished that now. He does not have a known history of peripheral arterial disease. He does have a history of diabetic peripheral neuropathy. ABIs in this clinic were 1.00 on the right and 1.09 on the left Past medical history includes CABG x4 at [facility] on November 14, hypertension, type 2 diabetes with peripheral neuropathy with a recent hemoglobin A1c of 7.3, cervical radicular pain, basal cell CA of the scalp. 11/03/2018; type 2 diabetes on oral agents. These are 2 wounds in a greater saphenous vein harvest site for a CABG on November 19. The more superior part of this on the surgical line is healed. He has a much better looking surface on this this week. He has been using Prisma under compression 1/22; great  saphenous vein harvest site is down to a single wound. Better looking surface although still requiring debridement. We have been using Prisma under Kerlix Coban. Change to Iodoflex under 3 layer compression. 1/29; grade harvest vein harvest site is down to a single shallow wound. Better looking wound surface. I changed to Iodoflex under 3 layer compression last week this seems to have helped. No debridement was necessary Electronic Signature(s) Signed: 11/18/2018 9:49:40 AM By: Linton Ham MD Entered By: Linton Ham on 11/17/2018 15:57:27 Rickey Sherman, Rickey Sherman ([id number]) -------------------------------------------------------------------------------- Physical Exam Details Patient Name: ZACK, CRAGER. Date of Service: 11/17/2018 1:30 PM Medical Record Number: [id number] Patient Account Number: [id number] Date of Birth/Sex: April 26, 1946 (73 y.o. M) Treating RN: Primary Care Provider: Harrel Lemon Other Clinician: Referring Provider: Harrel Lemon Treating Provider/Extender: Tito Dine in Treatment: 3 Constitutional Sitting or standing Blood Pressure is within target range for patient.. Pulse regular and within target range for patient.Marland Kitchen Respirations regular, non-labored and within target range.. Temperature is normal and within the target range for the patient.Marland Kitchen appears in no distress. Notes Wound exam; wound surface looks very healthy this week. No debridement is required. Dimensions are smaller no evidence of surrounding infection. He does not have significant edema issues under our compression wraps Electronic Signature(s) Signed: 11/18/2018 9:49:40 AM By: Linton Ham MD Entered By: Linton Ham on 11/17/2018 15:58:16 [name] ([id number]) -------------------------------------------------------------------------------- Physician Orders Details Patient Name: NIGUEL, MOURE. Date of Service: 11/17/2018 1:30 PM Medical Record Number: [id number] Patient Account  Number: [id number] Date of Birth/Sex: 1946/08/10 (73 y.o. M) Treating RN: Cornell Barman Primary Care Provider: Harrel Lemon Other  Clinician: Referring Provider: Harrel Lemon Treating Provider/Extender: Tito Dine in Treatment: 3 Verbal / Phone Orders: No Diagnosis Coding Wound Cleansing Wound #1 Left,Medial Lower Leg o Clean wound with Normal Saline. o May shower with protection. Anesthetic (add to Medication List) Wound #1 Left,Medial Lower Leg o Topical Lidocaine 4% cream applied to wound bed prior to debridement (In Clinic Only). Primary Wound Dressing Wound #1 Left,Medial Lower Leg o Iodoflex Secondary Dressing Wound #1 Left,Medial Lower Leg o ABD pad Dressing Change Frequency Wound #1 Left,Medial Lower Leg o Change dressing every week Follow-up Appointments Wound #1 Left,Medial Lower Leg o Return Appointment in 1 week. Edema Control Wound #1 Left,Medial Lower Leg o 3 Layer Compression System - Left Lower Extremity Electronic Signature(s) Signed: 11/17/2018 6:00:27 PM By: Gretta Cool, BSN, RN, CWS, Kim RN, BSN Signed: 11/18/2018 9:49:40 AM By: Linton Ham MD Entered By: Gretta Cool, BSN, RN, CWS, Kim on 11/17/2018 14:44:52 Rickey Sherman, Rickey Sherman ([id number]) -------------------------------------------------------------------------------- Problem List Details Patient Name: Rickey Sherman, Rickey Sherman. Date of Service: 11/17/2018 1:30 PM Medical Record Number: [id number] Patient Account Number: [id number] Date of Birth/Sex: 1946-02-12 (73 y.o. M) Treating RN: Primary Care Provider: Harrel Lemon Other Clinician: Referring Provider: Harrel Lemon Treating Provider/Extender: Tito Dine in Treatment: 3 Active Problems ICD-10 Evaluated Encounter Code Description Active Date Today Diagnosis T81.31XD Disruption of external operation (surgical) wound, not 10/27/2018 No Yes elsewhere classified, subsequent encounter L97.222 Non-pressure chronic ulcer of left calf  with fat layer exposed 10/27/2018 No Yes E11.622 Type 2 diabetes mellitus with other skin ulcer 10/27/2018 No Yes Inactive Problems Resolved Problems Electronic Signature(s) Signed: 11/18/2018 9:49:40 AM By: Linton Ham MD Entered By: Linton Ham on 11/17/2018 15:55:49 [name] ([id number]) -------------------------------------------------------------------------------- Progress Note Details Patient Name: [name]. Date of Service: 11/17/2018 1:30 PM Medical Record Number: [id number] Patient Account Number: [id number] Date of Birth/Sex: September 04, 1946 (73 y.o. M) Treating RN: Primary Care Provider: Harrel Lemon Other Clinician: Referring Provider: Harrel Lemon Treating Provider/Extender: Tito Dine in Treatment: 3 Subjective History of Present Illness (HPI) ADMISSION 10/27/2018 This is a patient to is 73 year old type II diabetic on oral agents. In the fall of this year he had progressive difficulties with chest/epigastric discomfort. 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 He underwent a CABG x4 at [facility] on November 19. Among other things he had a left greater saphenous vein harvest. He had a complicated postop course. The area on his left leg was sutured but dehisced. The sutures have since been removed. He has been left with eschar over the top. He was apparently told to "leave this open to air". He has been washing it with soap and water. He did receive a course of doxycycline from his primary doctor in mid December he is finished that now. He does not have a known history of peripheral arterial disease. He does have a history of diabetic peripheral neuropathy. ABIs in this clinic were 1.00 on the right and 1.09 on the left Past medical history includes CABG x4 at [facility] on November 14, hypertension, type 2 diabetes with peripheral neuropathy with a recent hemoglobin A1c of 7.3, cervical radicular pain,  basal cell CA of the scalp. 11/03/2018; type 2 diabetes on oral agents. These are 2 wounds in a greater saphenous vein harvest site for a CABG on November 19. The more superior part of this on the surgical line is healed. He has a much better looking surface on this this week. He  has been using Prisma under compression 1/22; great saphenous vein harvest site is down to a single wound. Better looking surface although still requiring debridement. We have been using Prisma under Kerlix Coban. Change to Iodoflex under 3 layer compression. 1/29; grade harvest vein harvest site is down to a single shallow wound. Better looking wound surface. I changed to Iodoflex under 3 layer compression last week this seems to have helped. No debridement was necessary Objective Constitutional Sitting or standing Blood Pressure is within target range for patient.. Pulse regular and within target range for patient.Marland Kitchen Respirations regular, non-labored and within target range.. Temperature is normal and within the target range for the patient.Marland Kitchen appears in no distress. Vitals Time Taken: 1:32 PM, Height: 69 in, Weight: 166 lbs, BMI: 24.5, Temperature: 98.2 F, Pulse: 66 bpm, Respiratory Rate: 16 breaths/min, Blood Pressure: 127/54 mmHg. General Notes: Wound exam; wound surface looks very healthy this week. No debridement is required. Dimensions are smaller no evidence of surrounding infection. He does not have significant edema issues under our compression wraps Rickey Sherman, Rickey R. ([id number]) Integumentary (Hair, Skin) Wound #1 status is Open. Original cause of wound was Surgical Injury. The wound is located on the Left,Medial Lower Leg. The wound measures 0.9cm length x 0.7cm width x 0.2cm depth; 0.495cm^2 area and 0.099cm^3 volume. There is Fat Layer (Subcutaneous Tissue) Exposed exposed. There is no tunneling or undermining noted. There is a medium amount of serous drainage noted. The wound margin is flat and intact.  There is small (1-33%) pale granulation within the wound bed. There is a large (67-100%) amount of necrotic tissue within the wound bed including Adherent Slough. The periwound skin appearance exhibited: Dry/Scaly. The periwound skin appearance did not exhibit: Callus, Crepitus, Excoriation, Induration, Rash, Scarring, Maceration, Atrophie Blanche, Cyanosis, Ecchymosis, Hemosiderin Staining, Mottled, Pallor, Rubor, Erythema. Periwound temperature was noted as No Abnormality. The periwound has tenderness on palpation. Assessment Active Problems ICD-10 Disruption of external operation (surgical) wound, not elsewhere classified, subsequent encounter Non-pressure chronic ulcer of left calf with fat layer exposed Type 2 diabetes mellitus with other skin ulcer Diagnoses ICD-10 T81.31XD: Disruption of external operation (surgical) wound, not elsewhere classified, subsequent encounter L97.222: Non-pressure chronic ulcer of left calf with fat layer exposed E11.622: Type 2 diabetes mellitus with other skin ulcer Plan Wound Cleansing: Wound #1 Left,Medial Lower Leg: Clean wound with Normal Saline. May shower with protection. Anesthetic (add to Medication List): Wound #1 Left,Medial Lower Leg: Topical Lidocaine 4% cream applied to wound bed prior to debridement (In Clinic Only). Primary Wound Dressing: Wound #1 Left,Medial Lower Leg: Iodoflex Secondary Dressing: Wound #1 Left,Medial Lower Leg: ABD pad Dressing Change Frequency: Wound #1 Left,Medial Lower Leg: Change dressing every week Follow-up Appointments: Wound #1 Left,Medial Lower Leg: Return Appointment in 1 week. Edema Control: Wound #1 Left,Medial Lower Leg: 3 Layer Compression System - Left Lower Extremity Rickey Sherman, Rickey R. ([id number]) 1. Continue with Iodoflex under 3 layer compression 2. No debridement required today the wound looks better. Measurements were improved Electronic Signature(s) Signed: 11/18/2018 9:49:40 AM By:  Linton Ham MD Entered By: Linton Ham on 11/17/2018 16:12:53 [name] ([id number]) -------------------------------------------------------------------------------- SuperBill Details Patient Name: Rickey Sherman, Rickey Sherman. Date of Service: 11/17/2018 Medical Record Number: [id number] Patient Account Number: [id number] Date of Birth/Sex: August 15, 1946 (73 y.o. M) Treating RN: Cornell Barman Primary Care Provider: Harrel Lemon Other Clinician: Referring Provider: Harrel Lemon Treating Provider/Extender: Tito Dine in Treatment: 3 Diagnosis Coding ICD-10 Codes Code Description T81.31XD Disruption of external operation (surgical) wound,  not elsewhere classified, subsequent encounter L97.222 Non-pressure chronic ulcer of left calf with fat layer exposed E11.622 Type 2 diabetes mellitus with other skin ulcer Facility Procedures CPT4 Code: [id number] Description: (Facility Use Only) [phone] - [place] LWR LT LEG Modifier: Quantity: 1 Physician Procedures CPT4: Description Modifier Quantity Code [id number] - WC PHYS LEVEL 2 - EST PT 1 ICD-10 Diagnosis Description T81.31XD Disruption of external operation (surgical) wound, not elsewhere classified, subsequent encounter L97.222 Non-pressure chronic ulcer  of left calf with fat layer exposed Electronic Signature(s) Signed: 11/18/2018 9:49:40 AM By: Linton Ham MD Entered By: Linton Ham on 11/17/2018 16:13:18

## 2018-11-19 NOTE — Progress Notes (Signed)
Daily Session Note  Patient Details  Name: Rickey Sherman MRN: [id number] Date of Birth: 01-Nov-1945 Referring Provider:     Cardiac Rehab from 10/28/2018 in [facility] Cardiac and Pulmonary Rehab  Referring Provider  Edwina Barth      Encounter Date: 11/19/2018  Check In: Session Check In - 11/19/18 0749      Check-In   Supervising physician immediately available to respond to emergencies  See telemetry face sheet for immediately available ER MD    Location  [facility]-Cardiac & Pulmonary Rehab    Staff Present  Alberteen Sam, MA, RCEP, CCRP, Exercise Physiologist;Amanda Oletta Darter, BA, ACSM CEP, Exercise Physiologist;Meredith Sherryll Burger, RN BSN    Medication changes reported      No    Fall or balance concerns reported     No    Warm-up and Cool-down  Performed as group-led Higher education careers adviser Performed  Yes    VAD Patient?  No    PAD/SET Patient?  No      Pain Assessment   Currently in Pain?  No/denies          Social History   Tobacco Use  Smoking Status Never Smoker  Smokeless Tobacco Never Used    Goals Met:  Independence with exercise equipment Exercise tolerated well No report of cardiac concerns or symptoms Strength training completed today  Goals Unmet:  Not Applicable  Comments: Pt able to follow exercise prescription today without complaint.  Will continue to monitor for progression.    Dr. Emily Filbert is Medical Director for [place] and [facility].

## 2018-11-22 ENCOUNTER — Encounter: Payer: Medicare Other | Attending: Internal Medicine | Admitting: *Deleted

## 2018-11-22 DIAGNOSIS — M109 Gout, unspecified: Secondary | ICD-10-CM | POA: Insufficient documentation

## 2018-11-22 DIAGNOSIS — E11621 Type 2 diabetes mellitus with foot ulcer: Secondary | ICD-10-CM | POA: Diagnosis present

## 2018-11-22 DIAGNOSIS — I2510 Atherosclerotic heart disease of native coronary artery without angina pectoris: Secondary | ICD-10-CM | POA: Insufficient documentation

## 2018-11-22 DIAGNOSIS — Z951 Presence of aortocoronary bypass graft: Secondary | ICD-10-CM | POA: Diagnosis not present

## 2018-11-22 DIAGNOSIS — I10 Essential (primary) hypertension: Secondary | ICD-10-CM | POA: Insufficient documentation

## 2018-11-22 DIAGNOSIS — Z8249 Family history of ischemic heart disease and other diseases of the circulatory system: Secondary | ICD-10-CM | POA: Diagnosis not present

## 2018-11-22 DIAGNOSIS — T8131XD Disruption of external operation (surgical) wound, not elsewhere classified, subsequent encounter: Secondary | ICD-10-CM | POA: Insufficient documentation

## 2018-11-22 DIAGNOSIS — E1142 Type 2 diabetes mellitus with diabetic polyneuropathy: Secondary | ICD-10-CM | POA: Diagnosis not present

## 2018-11-22 DIAGNOSIS — M5412 Radiculopathy, cervical region: Secondary | ICD-10-CM | POA: Insufficient documentation

## 2018-11-22 DIAGNOSIS — L97222 Non-pressure chronic ulcer of left calf with fat layer exposed: Secondary | ICD-10-CM | POA: Insufficient documentation

## 2018-11-22 DIAGNOSIS — Z809 Family history of malignant neoplasm, unspecified: Secondary | ICD-10-CM | POA: Insufficient documentation

## 2018-11-22 DIAGNOSIS — Z85828 Personal history of other malignant neoplasm of skin: Secondary | ICD-10-CM | POA: Insufficient documentation

## 2018-11-22 NOTE — Progress Notes (Signed)
Daily Session Note  Patient Details  Name: Rickey Sherman MRN: [id number] Date of Birth: 1946/09/14 Referring Provider:     Cardiac Rehab from 10/28/2018 in [facility] Cardiac and Pulmonary Rehab  Referring Provider  Edwina Barth      Encounter Date: 11/22/2018  Check In: Session Check In - 11/22/18 0754      Check-In   Supervising physician immediately available to respond to emergencies  See telemetry face sheet for immediately available ER MD    Location  [facility]-Cardiac & Pulmonary Rehab    Staff Present  Earlean Shawl, BS, ACSM CEP, Exercise Physiologist;Susanne Bice, RN, BSN, CCRP;Jessica Pioneer, MA, RCEP, CCRP, Exercise Physiologist    Medication changes reported      No    Fall or balance concerns reported     No    Tobacco Cessation  No Change    Warm-up and Cool-down  Performed as group-led instruction    Resistance Training Performed  Yes    VAD Patient?  No    PAD/SET Patient?  No      Pain Assessment   Currently in Pain?  No/denies    Multiple Pain Sites  No          Social History   Tobacco Use  Smoking Status Never Smoker  Smokeless Tobacco Never Used    Goals Met:  Independence with exercise equipment Exercise tolerated well No report of cardiac concerns or symptoms Strength training completed today  Goals Unmet:  Not Applicable  Comments: Pt able to follow exercise prescription today without complaint.  Will continue to monitor for progression.    Dr. Emily Filbert is Medical Director for [facility].

## 2018-11-24 ENCOUNTER — Encounter: Payer: Medicare Other | Attending: Internal Medicine | Admitting: Internal Medicine

## 2018-11-24 DIAGNOSIS — T8131XD Disruption of external operation (surgical) wound, not elsewhere classified, subsequent encounter: Secondary | ICD-10-CM | POA: Diagnosis not present

## 2018-11-24 DIAGNOSIS — Z85828 Personal history of other malignant neoplasm of skin: Secondary | ICD-10-CM | POA: Insufficient documentation

## 2018-11-24 DIAGNOSIS — E1142 Type 2 diabetes mellitus with diabetic polyneuropathy: Secondary | ICD-10-CM | POA: Insufficient documentation

## 2018-11-24 DIAGNOSIS — E11621 Type 2 diabetes mellitus with foot ulcer: Secondary | ICD-10-CM | POA: Diagnosis not present

## 2018-11-24 DIAGNOSIS — Y832 Surgical operation with anastomosis, bypass or graft as the cause of abnormal reaction of the patient, or of later complication, without mention of misadventure at the time of the procedure: Secondary | ICD-10-CM | POA: Diagnosis not present

## 2018-11-24 DIAGNOSIS — L97222 Non-pressure chronic ulcer of left calf with fat layer exposed: Secondary | ICD-10-CM | POA: Insufficient documentation

## 2018-11-24 DIAGNOSIS — E1151 Type 2 diabetes mellitus with diabetic peripheral angiopathy without gangrene: Secondary | ICD-10-CM | POA: Insufficient documentation

## 2018-11-24 DIAGNOSIS — Z955 Presence of coronary angioplasty implant and graft: Secondary | ICD-10-CM | POA: Diagnosis not present

## 2018-11-24 DIAGNOSIS — I2510 Atherosclerotic heart disease of native coronary artery without angina pectoris: Secondary | ICD-10-CM | POA: Insufficient documentation

## 2018-11-24 DIAGNOSIS — Z951 Presence of aortocoronary bypass graft: Secondary | ICD-10-CM

## 2018-11-24 DIAGNOSIS — E11622 Type 2 diabetes mellitus with other skin ulcer: Secondary | ICD-10-CM | POA: Diagnosis not present

## 2018-11-24 NOTE — Progress Notes (Signed)
Daily Session Note  Patient Details  Name: Rickey Sherman MRN: [id number] Date of Birth: Dec 26, 1945 Referring Provider:     Cardiac Rehab from 10/28/2018 in [facility] Cardiac and Pulmonary Rehab  Referring Provider  Edwina Barth      Encounter Date: 11/24/2018  Check In: Session Check In - 11/24/18 0802      Check-In   Supervising physician immediately available to respond to emergencies  See telemetry face sheet for immediately available ER MD    Location  [facility]-Cardiac & Pulmonary Rehab    Staff Present  Alberteen Sam, MA, RCEP, CCRP, Exercise Physiologist;Mel Tadros RCP,RRT,BSRT;Heath Lark, RN, BSN, CCRP    Medication changes reported      No    Fall or balance concerns reported     No    Warm-up and Cool-down  Performed as group-led instruction    Resistance Training Performed  Yes    VAD Patient?  No      Pain Assessment   Currently in Pain?  No/denies          Social History   Tobacco Use  Smoking Status Never Smoker  Smokeless Tobacco Never Used    Goals Met:  Independence with exercise equipment Exercise tolerated well No report of cardiac concerns or symptoms Strength training completed today  Goals Unmet:  Not Applicable  Comments: Pt able to follow exercise prescription today without complaint.  Will continue to monitor for progression.    Dr. Emily Filbert is Medical Director for [facility] and [facility].

## 2018-11-25 NOTE — Progress Notes (Signed)
PACEN, WATFORD ([id number]) Visit Report for 11/24/2018 HPI Details Patient Name: Rickey Sherman, Rickey Sherman. Date of Service: 11/24/2018 3:30 PM Medical Record Number: [id number] Patient Account Number: [id number] Date of Birth/Sex: November 16, 1945 (73 y.o. M) Treating RN: Cornell Barman Primary Care Provider: Harrel Lemon Other Clinician: Referring Provider: Harrel Lemon Treating Provider/Extender: Tito Dine in Treatment: 4 History of Present Illness HPI Description: ADMISSION 10/27/2018 This is a patient to is 73 year old type II diabetic on oral agents. In the fall of this year he had progressive difficulties with chest/epigastric discomfort. 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 He underwent a CABG x4 at [facility] on November 19. Among other things he had a left greater saphenous vein harvest. He had a complicated postop course. The area on his left leg was sutured but dehisced. The sutures have since been removed. He has been left with eschar over the top. He was apparently told to "leave this open to air". He has been washing it with soap and water. He did receive a course of doxycycline from his primary doctor in mid December he is finished that now. He does not have a known history of peripheral arterial disease. He does have a history of diabetic peripheral neuropathy. ABIs in this clinic were 1.00 on the right and 1.09 on the left Past medical history includes CABG x4 at [facility] on November 14, hypertension, type 2 diabetes with peripheral neuropathy with a recent hemoglobin A1c of 7.3, cervical radicular pain, basal cell CA of the scalp. 11/03/2018; type 2 diabetes on oral agents. These are 2 wounds in a greater saphenous vein harvest site for a CABG on November 19. The more superior part of this on the surgical line is healed. He has a much better looking surface on this this week. He has been using Prisma under compression 1/22;  great saphenous vein harvest site is down to a single wound. Better looking surface although still requiring debridement. We have been using Prisma under Kerlix Coban. Change to Iodoflex under 3 layer compression. 1/29; grade harvest vein harvest site is down to a single shallow wound. Better looking wound surface. I changed to Iodoflex under 3 layer compression last week this seems to have helped. No debridement was necessary 2/5; great saphenous vein harvest site wound down about half the size of last time. Using Iodoflex under 3 layer compression Electronic Signature(s) Signed: 11/24/2018 5:51:47 PM By: Linton Ham MD Entered By: Linton Ham on 11/24/2018 17:02:49 Rickey Sherman, Rickey Sherman ([id number]) -------------------------------------------------------------------------------- Physical Exam Details Patient Name: Rickey Sherman. Date of Service: 11/24/2018 3:30 PM Medical Record Number: [id number] Patient Account Number: [id number] Date of Birth/Sex: 31-Jul-1946 (73 y.o. M) Treating RN: Cornell Barman Primary Care Provider: Harrel Lemon Other Clinician: Referring Provider: Harrel Lemon Treating Provider/Extender: Tito Dine in Treatment: 4 Constitutional Sitting or standing Blood Pressure is within target range for patient.. Pulse regular and within target range for patient.Marland Kitchen Respirations regular, non-labored and within target range.. Temperature is normal and within the target range for the patient.Marland Kitchen appears in no distress. Notes Wound exam; wound surface looks very healthy no debridement is required. Dimensions are smaller by considerable degree. 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 Date of Service:  11/24/2018 3:30 PM Medical Record Number: [id number] Patient Account Number: [id number] Date of  Birth/Sex: 1946/09/11 (72 y.o. M) Treating RN: Cornell Barman Primary Care Provider: Harrel Lemon Other Clinician: Referring Provider: Harrel Lemon Treating Provider/Extender: Tito Dine in Treatment: 4 Verbal / Phone Orders: No Diagnosis Coding Wound Cleansing Wound #1 Left,Medial Lower Leg o Clean wound with Normal Saline. o May shower with protection. Anesthetic (add to Medication List) Wound #1 Left,Medial Lower Leg o Topical Lidocaine 4% cream applied to wound bed prior to debridement (In Clinic Only). Primary Wound Dressing Wound #1 Left,Medial Lower Leg o Iodoflex Secondary Dressing Wound #1 Left,Medial Lower Leg o ABD pad Dressing Change Frequency Wound #1 Left,Medial Lower Leg o Change dressing every week Follow-up Appointments Wound #1 Left,Medial Lower Leg o Return Appointment in 1 week. Edema Control Wound #1 Left,Medial Lower Leg o 3 Layer Compression System - Left Lower Extremity Electronic Signature(s) Signed: 11/24/2018 5:28:11 PM By: Gretta Cool, BSN, RN, CWS, Kim RN, BSN Signed: 11/24/2018 5:51:47 PM By: Linton Ham MD Entered By: Gretta Cool, BSN, RN, CWS, Kim on 11/24/2018 16:59:57 Rickey Sherman, Rickey Sherman ([id number]) -------------------------------------------------------------------------------- Problem List Details Patient Name: Rickey Sherman, Rickey Sherman. Date of Service: 11/24/2018 3:30 PM Medical Record Number: [id number] Patient Account Number: [id number] Date of Birth/Sex: 05-15-1946 (73 y.o. M) Treating RN: Cornell Barman Primary Care Provider: Harrel Lemon Other Clinician: Referring Provider: Harrel Lemon Treating Provider/Extender: Tito Dine in Treatment: 4 Active Problems ICD-10 Evaluated Encounter Code Description Active Date Today Diagnosis T81.31XD Disruption of external operation (surgical) wound, not 10/27/2018 No Yes elsewhere  classified, subsequent encounter L97.222 Non-pressure chronic ulcer of left calf with fat layer exposed 10/27/2018 No Yes E11.622 Type 2 diabetes mellitus with other skin ulcer 10/27/2018 No Yes Inactive Problems Resolved Problems Electronic Signature(s) Signed: 11/24/2018 5:51:47 PM By: Linton Ham MD Entered By: Linton Ham on 11/24/2018 17:01:56 Rickey Sherman ([id number]) -------------------------------------------------------------------------------- Progress Note Details Patient Name: Rickey Sherman. Date of Service: 11/24/2018 3:30 PM Medical Record Number: [id number] Patient Account Number: [id number] Date of Birth/Sex: 03/20/1946 (73 y.o. M) Treating RN: Cornell Barman Primary Care Provider: Harrel Lemon Other Clinician: Referring Provider: Harrel Lemon Treating Provider/Extender: Tito Dine in Treatment: 4 Subjective History of Present Illness (HPI) ADMISSION 10/27/2018 This is a patient to is 73 year old type II diabetic on oral agents. In the fall of this year he had progressive difficulties with chest/epigastric discomfort. 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 He underwent a CABG x4 at [facility] on November 19. Among other things he had a left greater saphenous vein harvest. He had a complicated postop course. The area on his left leg was sutured but dehisced. The sutures have since been removed. He has been left with eschar over the top. He was apparently told to "leave this open to air". He has been washing it with soap and water. He did receive a course of doxycycline from his primary doctor in mid December he is finished that now. He does not have a known history of peripheral arterial disease. He does have a history of diabetic peripheral neuropathy. ABIs in this clinic were 1.00 on the right and 1.09 on the left Past medical history includes CABG x4 at [facility] on November 14, hypertension, type 2  diabetes with peripheral neuropathy with a recent hemoglobin A1c of 7.3, cervical radicular pain, basal cell CA of the scalp. 11/03/2018; type 2 diabetes on oral agents. These are 2 wounds in a greater saphenous vein harvest site for a CABG on November 19. The more superior part of  this on the surgical line is healed. He has a much better looking surface on this this week. He has been using Prisma under compression 1/22; great saphenous vein harvest site is down to a single wound. Better looking surface although still requiring debridement. We have been using Prisma under Kerlix Coban. Change to Iodoflex under 3 layer compression. 1/29; grade harvest vein harvest site is down to a single shallow wound. Better looking wound surface. I changed to Iodoflex under 3 layer compression last week this seems to have helped. No debridement was necessary 2/5; great saphenous vein harvest site wound down about half the size of last time. Using Iodoflex under 3 layer compression Objective Constitutional Sitting or standing Blood Pressure is within target range for patient.. Pulse regular and within target range for patient.Marland Kitchen Respirations regular, non-labored and within target range.. Temperature is normal and within the target range for the patient.Marland Kitchen appears in no distress. Vitals Time Taken: 4:09 PM, Height: 69 in, Weight: 166 lbs, BMI: 24.5, Temperature: 98.1 F, Pulse: 62 bpm, Respiratory Rate: 16 breaths/min, Blood Pressure: 149/55 mmHg. General Notes: Wound exam; wound surface looks very healthy no debridement is required. Dimensions are smaller by Rickey Sherman ([id number]) considerable degree. His edema control is good Integumentary (Hair, Skin) Wound #1 status is Open. Original cause of wound was Surgical Injury. The wound is located on the Left,Medial Lower Leg. The wound measures 0.5cm length x 0.3cm width x 0.1cm depth; 0.118cm^2 area and 0.012cm^3 volume. There is Fat Layer (Subcutaneous  Tissue) Exposed exposed. There is no tunneling or undermining noted. There is a medium amount of serous drainage noted. The wound margin is flat and intact. There is large (67-100%) pink granulation within the wound bed. There is a small (1-33%) amount of necrotic tissue within the wound bed including Adherent Slough. The periwound skin appearance exhibited: Dry/Scaly. The periwound skin appearance did not exhibit: Callus, Crepitus, Excoriation, Induration, Rash, Scarring, Maceration, Atrophie Blanche, Cyanosis, Ecchymosis, Hemosiderin Staining, Mottled, Pallor, Rubor, Erythema. Periwound temperature was noted as No Abnormality. The periwound has tenderness on palpation. Assessment Active Problems ICD-10 Disruption of external operation (surgical) wound, not elsewhere classified, subsequent encounter Non-pressure chronic ulcer of left calf with fat layer exposed Type 2 diabetes mellitus with other skin ulcer Diagnoses ICD-10 T81.31XD: Disruption of external operation (surgical) wound, not elsewhere classified, subsequent encounter L97.222: Non-pressure chronic ulcer of left calf with fat layer exposed E11.622: Type 2 diabetes mellitus with other skin ulcer Plan Wound Cleansing: Wound #1 Left,Medial Lower Leg: Clean wound with Normal Saline. May shower with protection. Anesthetic (add to Medication List): Wound #1 Left,Medial Lower Leg: Topical Lidocaine 4% cream applied to wound bed prior to debridement (In Clinic Only). Primary Wound Dressing: Wound #1 Left,Medial Lower Leg: Iodoflex Secondary Dressing: Wound #1 Left,Medial Lower Leg: ABD pad Dressing Change Frequency: Wound #1 Left,Medial Lower Leg: Change dressing every week Follow-up Appointments: Wound #1 Left,Medial Lower Leg: Return Appointment in 1 week. Edema Control: Rickey Sherman, Rickey Sherman ([id number]) Wound #1 Left,Medial Lower Leg: 3 Layer Compression System - Left Lower Extremity 1. Continue with the Iodoflex under 3  layer compression 2. Hopefully close next week Electronic Signature(s) Signed: 11/24/2018 5:51:47 PM By: Linton Ham MD Entered By: Linton Ham on 11/24/2018 17:05:12 Rickey Sherman ([id number]) -------------------------------------------------------------------------------- [place] Details Patient Name: Rickey Sherman, Rickey Sherman. Date of Service: 11/24/2018 Medical Record Number: [id number] Patient Account Number: [id number] Date of Birth/Sex: 1946/09/27 (73 y.o. M) Treating RN: Cornell Barman Primary Care Provider: Harrel Lemon Other Clinician: Referring Provider:  Harrel Lemon Treating Provider/Extender: Ricard Dillon Weeks in Treatment: 4 Diagnosis Coding ICD-10 Codes Code Description T81.31XD Disruption of external operation (surgical) wound, not elsewhere classified, subsequent encounter L97.222 Non-pressure chronic ulcer of left calf with fat layer exposed E11.622 Type 2 diabetes mellitus with other skin ulcer Facility Procedures CPT4 Code: [id number] Description: (Facility Use Only) [id number] - [place] LWR LT LEG Modifier: Quantity: 1 Physician Procedures CPT4: Description Modifier Quantity Code [id number] - WC PHYS LEVEL 2 - EST PT 1 ICD-10 Diagnosis Description T81.31XD Disruption of external operation (surgical) wound, not elsewhere classified, subsequent encounter L97.222 Non-pressure chronic ulcer  of left calf with fat layer exposed Electronic Signature(s) Signed: 11/24/2018 5:51:47 PM By: Linton Ham MD Entered By: Linton Ham on 11/24/2018 17:05:38

## 2018-11-26 ENCOUNTER — Encounter: Payer: Medicare Other | Admitting: *Deleted

## 2018-11-26 DIAGNOSIS — E11621 Type 2 diabetes mellitus with foot ulcer: Secondary | ICD-10-CM | POA: Diagnosis not present

## 2018-11-26 DIAGNOSIS — Z951 Presence of aortocoronary bypass graft: Secondary | ICD-10-CM

## 2018-11-26 NOTE — Progress Notes (Signed)
Daily Session Note  Patient Details  Name: MIRANDA GARBER MRN: [id number] Date of Birth: 02-03-1946 Referring Provider:     Cardiac Rehab from 10/28/2018 in [facility] Cardiac and Pulmonary Rehab  Referring Provider  Edwina Barth      Encounter Date: 11/26/2018  Check In: Session Check In - 11/26/18 0738      Check-In   Supervising physician immediately available to respond to emergencies  See telemetry face sheet for immediately available ER MD    Location  [facility]-Cardiac & Pulmonary Rehab    Staff Present  Renita Papa, RN BSN;Leinani Lisbon Luan Pulling, MA, RCEP, CCRP, Exercise Physiologist;Amanda Oletta Darter, [place], ACSM CEP, Exercise Physiologist    Medication changes reported      No    Fall or balance concerns reported     No    Warm-up and Cool-down  Performed as group-led instruction    Resistance Training Performed  Yes    VAD Patient?  No    PAD/SET Patient?  No      Pain Assessment   Currently in Pain?  No/denies          Social History   Tobacco Use  Smoking Status Never Smoker  Smokeless Tobacco Never Used    Goals Met:  Independence with exercise equipment Exercise tolerated well Personal goals reviewed No report of cardiac concerns or symptoms Strength training completed today  Goals Unmet:  Not Applicable  Comments: Pt able to follow exercise prescription today without complaint.  Will continue to monitor for progression.    Dr. Emily Filbert is Medical Director for [facility] and [facility].

## 2018-11-26 NOTE — Progress Notes (Signed)
RAY, GERVASI ([id number]) Visit Report for 11/24/2018 Arrival Information Details Patient Name: Rickey Sherman, Rickey Sherman. Date of Service: 11/24/2018 3:30 PM Medical Record Number: [id number] Patient Account Number: [id number] Date of Birth/Sex: 09-16-1946 (72 y.o. M) Treating RN: Cornell Barman Primary Care Qunicy Higinbotham: Harrel Lemon Other Clinician: Referring Madix Blowe: Harrel Lemon Treating Kylie Simmonds/Extender: Tito Dine in Treatment: 4 Visit Information History Since Last Visit Added or deleted any medications: No Patient Arrived: Ambulatory Any new allergies or adverse reactions: No Arrival Time: 16:08 Had a fall or experienced change in No Accompanied By: Parke Simmers activities of daily living that may affect Transfer Assistance: None risk of falls: Patient Identification Verified: Yes Signs or symptoms of abuse/neglect since last visito No Secondary Verification Process Completed: Yes Hospitalized since last visit: No Implantable device outside of the clinic excluding No cellular tissue based products placed in the center since last visit: Has Dressing in Place as Prescribed: Yes Pain Present Now: No Electronic Signature(s) Signed: 11/24/2018 4:36:22 PM By: Lorine Bears RCP, RRT, CHT Entered By: Lorine Bears on 11/24/2018 16:09:32 Rickey Sherman ([id number]) -------------------------------------------------------------------------------- Encounter Discharge Information Details Patient Name: Rickey Sherman. Date of Service: 11/24/2018 3:30 PM Medical Record Number: [id number] Patient Account Number: [id number] Date of Birth/Sex: 1946/09/07 (73 y.o. M) Treating RN: Harold Barban Primary Care Milena Liggett: Harrel Lemon Other Clinician: Referring Kamare Caspers: Harrel Lemon Treating Toshiro Hanken/Extender: Tito Dine in Treatment: 4 Encounter Discharge Information Items Discharge Condition: Stable Ambulatory Status: Ambulatory Discharge Destination:  Home Transportation: Private Auto Accompanied By: wife Schedule Follow-up Appointment: Yes Clinical Summary of Care: Electronic Signature(s) Signed: 11/25/2018 11:43:02 AM By: Harold Barban Entered By: Harold Barban on 11/24/2018 17:15:36 Rickey Sherman ([id number]) -------------------------------------------------------------------------------- Lower Extremity Assessment Details Patient Name: [name], [name]. Date of Service: 11/24/2018 3:30 PM Medical Record Number: [id number] Patient Account Number: [id number] Date of Birth/Sex: [birth year] (73 y.o. M) Treating RN: Montey Hora Primary Care Goldie Dimmer: Harrel Lemon Other Clinician: Referring Joanette Silveria: Harrel Lemon Treating Lycia Sachdeva/Extender: Tito Dine in Treatment: 4 Edema Assessment Assessed: [Left: No] [Right: No] [Left: Edema] [Right: :] Calf Left: Right: Point of Measurement: 33 cm From Medial Instep 33 cm cm Ankle Left: Right: Point of Measurement: 11 cm From Medial Instep 21.5 cm cm Vascular Assessment Pulses: Dorsalis Pedis Palpable: [Left:Yes] Posterior Tibial Extremity colors, hair growth, and conditions: Extremity Color: [Left:Normal] Hair Growth on Extremity: [Left:Yes] Temperature of Extremity: [Left:Warm] Capillary Refill: [Left:< 3 seconds] Toe Nail Assessment Left: Right: Thick: Yes Discolored: No Deformed: No Improper Length and Hygiene: No Electronic Signature(s) Signed: 11/24/2018 5:48:53 PM By: Montey Hora Entered By: Montey Hora on 11/24/2018 16:45:48 Rickey Sherman ([id number]) -------------------------------------------------------------------------------- Multi Wound Chart Details Patient Name: Rickey Sherman. Date of Service: 11/24/2018 3:30 PM Medical Record Number: [id number] Patient Account Number: [id number] Date of Birth/Sex: [birth year] (73 y.o. M) Treating RN: Cornell Barman Primary Care Akasha Melena: Harrel Lemon Other Clinician: Referring Jamorion Gomillion: Harrel Lemon Treating Shatyra Becka/Extender: Tito Dine in Treatment: 4 Vital Signs Height(in): 69 Pulse(bpm): 81 Weight(lbs): 166 Blood Pressure(mmHg): 149/55 Body Mass Index(BMI): 25 Temperature(F): 98.1 Respiratory Rate 16 (breaths/min): Photos: [1:No Photos] [N/A:N/A] Wound Location: [1:Left Lower Leg - Medial] [N/A:N/A] Wounding Event: [1:Surgical Injury] [N/A:N/A] Primary Etiology: [1:Diabetic Wound/Ulcer of the Lower Extremity] [N/A:N/A] Comorbid History: [1:Coronary Artery Disease, Hypertension, Type II Diabetes, Gout] [N/A:N/A] Date Acquired: [1:08/31/2018] [N/A:N/A] Weeks of Treatment: [1:4] [N/A:N/A] Wound Status: [1:Open] [N/A:N/A] Measurements L x W x D [1:0.5x0.3x0.1] [N/A:N/A] (cm) Area (cm) : [1:0.118] [N/A:N/A] Volume (cm) : [1:0.012] [N/A:N/A] %  Reduction in Area: [1:88.90%] [N/A:N/A] % Reduction in Volume: [1:97.20%] [N/A:N/A] Classification: [1:Grade 2] [N/A:N/A] Exudate Amount: [1:Medium] [N/A:N/A] Exudate Type: [1:Serous] [N/A:N/A] Exudate Color: [1:amber] [N/A:N/A] Wound Margin: [1:Flat and Intact] [N/A:N/A] Granulation Amount: [1:Large (67-100%)] [N/A:N/A] Granulation Quality: [1:Pink] [N/A:N/A] Necrotic Amount: [1:Small (1-33%)] [N/A:N/A] Exposed Structures: [1:Fat Layer (Subcutaneous Tissue) Exposed: Yes Fascia: No Tendon: No Muscle: No Joint: No Bone: No] [N/A:N/A] Epithelialization: [1:None] [N/A:N/A] Periwound Skin Texture: [1:Excoriation: No Induration: No Callus: No Crepitus: No] [N/A:N/A] Rash: No Scarring: No Periwound Skin Moisture: Dry/Scaly: Yes N/A N/A Maceration: No Periwound Skin Color: Atrophie Blanche: No N/A N/A Cyanosis: No Ecchymosis: No Erythema: No Hemosiderin Staining: No Mottled: No Pallor: No Rubor: No Temperature: No Abnormality N/A N/A Tenderness on Palpation: Yes N/A N/A Wound Preparation: Ulcer Cleansing: N/A N/A Rinsed/Irrigated with Saline, Other: soap and water Topical Anesthetic  Applied: Other: lidocaine 4% Treatment Notes Electronic Signature(s) Signed: 11/24/2018 5:51:47 PM By: Linton Ham MD Entered By: Linton Ham on 11/24/2018 17:02:05 ISAY, PERLEBERG ([id number]) -------------------------------------------------------------------------------- [place] Details Patient Name: Rickey Sherman. Date of Service: 11/24/2018 3:30 PM Medical Record Number: [id number] Patient Account Number: [id number] Date of Birth/Sex: 1946-05-21 (73 y.o. M) Treating RN: Cornell Barman Primary Care Creed Kail: Harrel Lemon Other Clinician: Referring Adiba Fargnoli: Harrel Lemon Treating Clevon Khader/Extender: Tito Dine in Treatment: 4 Active Inactive Abuse / Safety / Falls / Self Care Management Nursing Diagnoses: Potential for falls Goals: Patient will not experience any injury related to falls Date Initiated: 10/27/2018 Target Resolution Date: 01/22/2019 Goal Status: Active Interventions: Assess fall risk on admission and as needed Notes: Orientation to the Wound Care Program Nursing Diagnoses: Knowledge deficit related to the wound healing center program Goals: Patient/caregiver will verbalize understanding of the [facility] Program Date Initiated: 10/27/2018 Target Resolution Date: 01/22/2019 Goal Status: Active Interventions: Provide education on orientation to the wound center Notes: Wound/Skin Impairment Nursing Diagnoses: Impaired tissue integrity Goals: Ulcer/skin breakdown will heal within 14 weeks Date Initiated: 10/27/2018 Target Resolution Date: 01/22/2019 Goal Status: Active Interventions: Assess patient/caregiver ability to obtain necessary supplies RAHMON, HEIGL ([id number]) Assess patient/caregiver ability to perform ulcer/skin care regimen upon admission and as needed Assess ulceration(s) every visit Notes: Electronic Signature(s) Signed: 11/24/2018 5:28:11 PM By: Gretta Cool, BSN, RN, CWS, Kim RN, BSN Entered By: Gretta Cool,  BSN, RN, CWS, Kim on 11/24/2018 16:59:21 Rickey Sherman ([id number]) -------------------------------------------------------------------------------- Pain Assessment Details Patient Name: PRISCILLA, FINKLEA. Date of Service: 11/24/2018 3:30 PM Medical Record Number: [id number] Patient Account Number: [id number] Date of Birth/Sex: December 21, 1945 (73 y.o. M) Treating RN: Cornell Barman Primary Care Miia Blanks: Harrel Lemon Other Clinician: Referring Bannon Giammarco: Harrel Lemon Treating Keyonte Cookston/Extender: Tito Dine in Treatment: 4 Active Problems Location of Pain Severity and Description of Pain Patient Has Paino No Site Locations Pain Management and Medication Current Pain Management: Electronic Signature(s) Signed: 11/24/2018 4:36:22 PM By: Lorine Bears RCP, RRT, CHT Signed: 11/24/2018 5:28:11 PM By: Gretta Cool, BSN, RN, CWS, Kim RN, BSN Entered By: Lorine Bears on 11/24/2018 16:09:41 Rickey Sherman ([id number]) -------------------------------------------------------------------------------- Patient/Caregiver Education Details Patient Name: MAGNUS, CRESCENZO. Date of Service: 11/24/2018 3:30 PM Medical Record Number: [id number] Patient Account Number: [id number] Date of Birth/Gender: [birth year] (73 y.o. M) Treating RN: Cornell Barman Primary Care Physician: Harrel Lemon Other Clinician: Referring Physician: Harrel Lemon Treating Physician/Extender: Tito Dine in Treatment: 4 Education Assessment Education Provided To: Patient Education Topics Provided Venous: Handouts: Controlling Swelling with Multilayered Compression Wraps Methods: Demonstration, Explain/Verbal Responses: State content correctly Electronic Signature(s) Signed: 11/25/2018  11:43:02 AM By: Harold Barban Entered By: Harold Barban on 11/24/2018 17:15:40 Rickey Sherman ([id number]) -------------------------------------------------------------------------------- Wound Assessment  Details Patient Name: HADES, MATHEW. Date of Service: 11/24/2018 3:30 PM Medical Record Number: [id number] Patient Account Number: [id number] Date of Birth/Sex: 07-Jan-1946 (73 y.o. M) Treating RN: Montey Hora Primary Care Meghna Hagmann: Harrel Lemon Other Clinician: Referring Braniya Farrugia: Harrel Lemon Treating Lili Harts/Extender: Tito Dine in Treatment: 4 Wound Status Wound Number: 1 Primary Diabetic Wound/Ulcer of the Lower Extremity Etiology: Wound Location: Left Lower Leg - Medial Wound Status: Open Wounding Event: Surgical Injury Comorbid Coronary Artery Disease, Hypertension, Type Date Acquired: 08/31/2018 History: II Diabetes, Gout Weeks Of Treatment: 4 Clustered Wound: No Wound Measurements Length: (cm) 0.5 Width: (cm) 0.3 Depth: (cm) 0.1 Area: (cm) 0.118 Volume: (cm) 0.012 % Reduction in Area: 88.9% % Reduction in Volume: 97.2% Epithelialization: None Tunneling: No Undermining: No Wound Description Classification: Grade 2 Wound Margin: Flat and Intact Exudate Amount: Medium Exudate Type: Serous Exudate Color: amber Foul Odor After Cleansing: No Slough/Fibrino Yes Wound Bed Granulation Amount: Large (67-100%) Exposed Structure Granulation Quality: Pink Fascia Exposed: No Necrotic Amount: Small (1-33%) Fat Layer (Subcutaneous Tissue) Exposed: Yes Necrotic Quality: Adherent Slough Tendon Exposed: No Muscle Exposed: No Joint Exposed: No Bone Exposed: No Periwound Skin Texture Texture Color No Abnormalities Noted: No No Abnormalities Noted: No Callus: No Atrophie Blanche: No Crepitus: No Cyanosis: No Excoriation: No Ecchymosis: No Induration: No Erythema: No Rash: No Hemosiderin Staining: No Scarring: No Mottled: No Pallor: No Moisture Rubor: No No Abnormalities Noted: No Dry / Scaly: Yes Temperature / Pain Maceration: No Temperature: No Abnormality Tenderness on Palpation: Yes JAHIEM, FRANZONI ([id number]) Wound  Preparation Ulcer Cleansing: Rinsed/Irrigated with Saline, Other: soap and water, Topical Anesthetic Applied: Other: lidocaine 4%, Electronic Signature(s) Signed: 11/24/2018 5:48:53 PM By: Montey Hora Entered By: Montey Hora on 11/24/2018 16:48:45 Rickey Sherman ([id number]) -------------------------------------------------------------------------------- Vitals Details Patient Name: VERNA, HAMON. Date of Service: 11/24/2018 3:30 PM Medical Record Number: [id number] Patient Account Number: [id number] Date of Birth/Sex: December 31, 1945 (73 y.o. M) Treating RN: Cornell Barman Primary Care Snigdha Howser: Harrel Lemon Other Clinician: Referring Joyce Heitman: Harrel Lemon Treating Lyman Balingit/Extender: Tito Dine in Treatment: 4 Vital Signs Time Taken: 16:09 Temperature (F): 98.1 Height (in): 69 Pulse (bpm): 62 Weight (lbs): 166 Respiratory Rate (breaths/min): 16 Body Mass Index (BMI): 24.5 Blood Pressure (mmHg): 149/55 Reference Range: 80 - 120 mg / dl Airway Electronic Signature(s) Signed: 11/24/2018 4:36:22 PM By: Lorine Bears RCP, RRT, CHT Entered By: Lorine Bears on 11/24/2018 16:12:50

## 2018-11-29 ENCOUNTER — Encounter: Payer: Medicare Other | Admitting: *Deleted

## 2018-11-29 DIAGNOSIS — Z951 Presence of aortocoronary bypass graft: Secondary | ICD-10-CM

## 2018-11-29 DIAGNOSIS — E11621 Type 2 diabetes mellitus with foot ulcer: Secondary | ICD-10-CM | POA: Diagnosis not present

## 2018-11-29 NOTE — Progress Notes (Signed)
Daily Session Note  Patient Details  Name: Rickey Sherman MRN: [id number] Date of Birth: 01-Jul-1946 Referring Provider:     Cardiac Rehab from 10/28/2018 in [facility]  Referring Provider  Edwina Barth      Encounter Date: 11/29/2018  Check In: Session Check In - 11/29/18 0752      Check-In   Supervising physician immediately available to respond to emergencies  See telemetry face sheet for immediately available ER MD    Staff Present  Earlean Shawl, BS, ACSM CEP, Exercise Physiologist;Jessica Luan Pulling, MA, RCEP, CCRP, Exercise Physiologist;Susanne Bice, RN, BSN, CCRP    Medication changes reported      No    Fall or balance concerns reported     No    Tobacco Cessation  No Change    Warm-up and Cool-down  Performed as group-led instruction    Resistance Training Performed  Yes    VAD Patient?  No    PAD/SET Patient?  No      Pain Assessment   Currently in Pain?  No/denies    Multiple Pain Sites  No          Social History   Tobacco Use  Smoking Status Never Smoker  Smokeless Tobacco Never Used    Goals Met:  Independence with exercise equipment Exercise tolerated well No report of cardiac concerns or symptoms Strength training completed today  Goals Unmet:  Not Applicable  Comments: Pt able to follow exercise prescription today without complaint.  Will continue to monitor for progression.    Dr. Emily Filbert is Medical Director for [facility] and [facility].

## 2018-12-01 ENCOUNTER — Encounter: Payer: Medicare Other | Admitting: Internal Medicine

## 2018-12-01 ENCOUNTER — Encounter: Payer: Medicare Other | Admitting: *Deleted

## 2018-12-01 DIAGNOSIS — E11621 Type 2 diabetes mellitus with foot ulcer: Secondary | ICD-10-CM | POA: Diagnosis not present

## 2018-12-01 DIAGNOSIS — Z951 Presence of aortocoronary bypass graft: Secondary | ICD-10-CM

## 2018-12-01 DIAGNOSIS — T8131XD Disruption of external operation (surgical) wound, not elsewhere classified, subsequent encounter: Secondary | ICD-10-CM | POA: Diagnosis not present

## 2018-12-01 NOTE — Progress Notes (Signed)
Daily Session Note  Patient Details  Name: Rickey Sherman MRN: [id number] Date of Birth: May 09, 1946 Referring Provider:     Cardiac Rehab from 10/28/2018 in [facility] Cardiac and Pulmonary Rehab  Referring Provider  Edwina Barth      Encounter Date: 12/01/2018  Check In: Session Check In - 12/01/18 0738      Check-In   Supervising physician immediately available to respond to emergencies  See telemetry face sheet for immediately available ER MD    Location  [facility]-Cardiac & Pulmonary Rehab    Staff Present  Heath Lark, RN, BSN, CCRP;Wyndham Santilli Bement, MA, RCEP, CCRP, Exercise Physiologist;Joseph Tessie Fass RCP,RRT,BSRT    Medication changes reported      No    Fall or balance concerns reported     No    Warm-up and Cool-down  Performed as group-led instruction    Resistance Training Performed  Yes    VAD Patient?  No    PAD/SET Patient?  No      Pain Assessment   Currently in Pain?  No/denies          Social History   Tobacco Use  Smoking Status Never Smoker  Smokeless Tobacco Never Used    Goals Met:  Independence with exercise equipment Exercise tolerated well No report of cardiac concerns or symptoms Strength training completed today  Goals Unmet:  Not Applicable  Comments: Pt able to follow exercise prescription today without complaint.  Will continue to monitor for progression.    Dr. Emily Filbert is Medical Director for [facility] and [facility].

## 2018-12-03 ENCOUNTER — Encounter: Payer: Medicare Other | Admitting: *Deleted

## 2018-12-03 DIAGNOSIS — E11621 Type 2 diabetes mellitus with foot ulcer: Secondary | ICD-10-CM | POA: Diagnosis not present

## 2018-12-03 DIAGNOSIS — Z951 Presence of aortocoronary bypass graft: Secondary | ICD-10-CM

## 2018-12-03 NOTE — Progress Notes (Signed)
Daily Session Note  Patient Details  Name: Rickey Sherman MRN: [id number] Date of Birth: July 20, 1946 Referring Provider:     Cardiac Rehab from 10/28/2018 in [facility] Cardiac and Pulmonary Rehab  Referring Provider  Edwina Barth      Encounter Date: 12/03/2018  Check In: Session Check In - 12/03/18 0829      Check-In   Supervising physician immediately available to respond to emergencies  See telemetry face sheet for immediately available ER MD    Location  [facility]-Cardiac & Pulmonary Rehab    Staff Present  Renita Papa, RN BSN;Jeanna Durrell BS, Exercise Physiologist;Patrice Moates Luan Pulling, MA, RCEP, CCRP, Exercise Physiologist    Medication changes reported      No    Fall or balance concerns reported     No    Warm-up and Cool-down  Performed as group-led instruction    Resistance Training Performed  Yes    VAD Patient?  No    PAD/SET Patient?  No      Pain Assessment   Currently in Pain?  No/denies          Social History   Tobacco Use  Smoking Status Never Smoker  Smokeless Tobacco Never Used    Goals Met:  Independence with exercise equipment Exercise tolerated well No report of cardiac concerns or symptoms Strength training completed today  Goals Unmet:  Not Applicable  Comments: Pt able to follow exercise prescription today without complaint.  Will continue to monitor for progression.    Dr. Emily Filbert is Medical Director for [facility] and [facility].

## 2018-12-03 NOTE — Progress Notes (Signed)
TION, TSE ([id number]) Visit Report for 12/01/2018 HPI Details Patient Name: Rickey Sherman, Rickey Sherman. Date of Service: 12/01/2018 9:45 AM Medical Record Number: [id number] Patient Account Number: [id number] Date of Birth/Sex: 06/25/1946 (73 y.o. M) Treating RN: Cornell Barman Primary Care Provider: Harrel Lemon Other Clinician: Referring Provider: Harrel Lemon Treating Provider/Extender: Tito Dine in Treatment: 5 History of Present Illness HPI Description: ADMISSION 10/27/2018 This is a patient to is 73 year old type II diabetic on oral agents. In the fall of this year he had progressive difficulties with chest/epigastric discomfort. 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 He underwent a CABG x4 at [facility] on November 19. Among other things he had a left greater saphenous vein harvest. He had a complicated postop course. The area on his left leg was sutured but dehisced. The sutures have since been removed. He has been left with eschar over the top. He was apparently told to "leave this open to air". He has been washing it with soap and water. He did receive a course of doxycycline from his primary doctor in mid December he is finished that now. He does not have a known history of peripheral arterial disease. He does have a history of diabetic peripheral neuropathy. ABIs in this clinic were 1.00 on the right and 1.09 on the left Past medical history includes CABG x4 at [facility] on November 14, hypertension, type 2 diabetes with peripheral neuropathy with a recent hemoglobin A1c of 7.3, cervical radicular pain, basal cell CA of the scalp. 11/03/2018; type 2 diabetes on oral agents. These are 2 wounds in a greater saphenous vein harvest site for a CABG on November 19. The more superior part of this on the surgical line is healed. He has a much better looking surface on this this week. He has been using Prisma under compression 1/22;  great saphenous vein harvest site is down to a single wound. Better looking surface although still requiring debridement. We have been using Prisma under Kerlix Coban. Change to Iodoflex under 3 layer compression. 1/29; grade harvest vein harvest site is down to a single shallow wound. Better looking wound surface. I changed to Iodoflex under 3 layer compression last week this seems to have helped. No debridement was necessary 2/5; great saphenous vein harvest site wound down about half the size of last time. Using Iodoflex under 3 layer compression 2/12; great saphenous vein harvest site. Arrived in clinic with some adherent Iodoflex and some nonviable skin. This was removed with the wound is closed Electronic Signature(s) Signed: 12/01/2018 6:19:03 PM By: Linton Ham MD Entered By: Linton Ham on 12/01/2018 10:35:57 Rickey Sherman ([id number]) -------------------------------------------------------------------------------- Physical Exam Details Patient Name: Rickey Sherman, Rickey Sherman. Date of Service: 12/01/2018 9:45 AM Medical Record Number: [id number] Patient Account Number: [id number] Date of Birth/Sex: September 11, 1946 (73 y.o. M) Treating RN: Cornell Barman Primary Care Provider: Harrel Lemon Other Clinician: Referring Provider: Harrel Lemon Treating Provider/Extender: Tito Dine in Treatment: 5 Constitutional Somewhat wide pulse pressure but the wound is closed. Pulse regular and within target range for patient.Marland Kitchen Respirations regular, non-labored and within target range.. Temperature is normal and within the target range for the patient.Marland Kitchen appears in no distress. Respiratory Respiratory effort is easy and symmetric bilaterally. Rate is normal at rest and on room air.. Notes Wound exam; the patient arrived with some adherent Iodoflex and some dry skin over the wound surface. This was gently removed with a #3 curette underneath the area was  fully epithelialized and healed. No  evidence of surrounding infection. This was a surgical wound. There is no edema Electronic Signature(s) Signed: 12/01/2018 6:19:03 PM By: Linton Ham MD Entered By: Linton Ham on 12/01/2018 10:39:03 Rickey Sherman ([id number]) -------------------------------------------------------------------------------- Physician Orders Details Patient Name: Rickey Sherman, Rickey Sherman. Date of Service: 12/01/2018 9:45 AM Medical Record Number: [id number] Patient Account Number: [id number] Date of Birth/Sex: [birth year] (73 y.o. M) Treating RN: Cornell Barman Primary Care Provider: Harrel Lemon Other Clinician: Referring Provider: Harrel Lemon Treating Provider/Extender: Tito Dine in Treatment: 5 Verbal / Phone Orders: No Diagnosis Coding Discharge From [facility] Services o Discharge from [place] Signature(s) Signed: 12/01/2018 6:19:03 PM By: Linton Ham MD Signed: 12/02/2018 8:43:41 AM By: Gretta Cool, BSN, RN, CWS, Kim RN, BSN Entered By: Gretta Cool, BSN, RN, CWS, Kim on 12/01/2018 10:14:06 Rickey Sherman, Rickey Sherman ([id number]) -------------------------------------------------------------------------------- Problem List Details Patient Name: Rickey Sherman, Rickey Sherman. Date of Service: 12/01/2018 9:45 AM Medical Record Number: [id number] Patient Account Number: [id number] Date of Birth/Sex: Mar 25, 1946 (73 y.o. M) Treating RN: Cornell Barman Primary Care Provider: Harrel Lemon Other Clinician: Referring Provider: Harrel Lemon Treating Provider/Extender: Tito Dine in Treatment: 5 Active Problems ICD-10 Evaluated Encounter Code Description Active Date Today Diagnosis T81.31XD Disruption of external operation (surgical) wound, not 10/27/2018 No Yes elsewhere classified, subsequent encounter L97.222 Non-pressure chronic ulcer of left calf with fat layer exposed 10/27/2018 No Yes E11.622 Type 2 diabetes mellitus with other skin ulcer 10/27/2018 No Yes Inactive Problems Resolved  Problems Electronic Signature(s) Signed: 12/01/2018 6:19:03 PM By: Linton Ham MD Entered By: Linton Ham on 12/01/2018 10:34:53 Rickey Sherman ([id number]) -------------------------------------------------------------------------------- Progress Note Details Patient Name: Rickey Sherman. Date of Service: 12/01/2018 9:45 AM Medical Record Number: [id number] Patient Account Number: [id number] Date of Birth/Sex: 05/25/1946 (73 y.o. M) Treating RN: Cornell Barman Primary Care Provider: Harrel Lemon Other Clinician: Referring Provider: Harrel Lemon Treating Provider/Extender: Tito Dine in Treatment: 5 Subjective History of Present Illness (HPI) ADMISSION 10/27/2018 This is a patient to is 73 year old type II diabetic on oral agents. In the fall of this year he had progressive difficulties with chest/epigastric discomfort. 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 He underwent a CABG x4 at [facility] on November 19. Among other things he had a left greater saphenous vein harvest. He had a complicated postop course. The area on his left leg was sutured but dehisced. The sutures have since been removed. He has been left with eschar over the top. He was apparently told to "leave this open to air". He has been washing it with soap and water. He did receive a course of doxycycline from his primary doctor in mid December he is finished that now. He does not have a known history of peripheral arterial disease. He does have a history of diabetic peripheral neuropathy. ABIs in this clinic were 1.00 on the right and 1.09 on the left Past medical history includes CABG x4 at [facility] on November 14, hypertension, type 2 diabetes with peripheral neuropathy with a recent hemoglobin A1c of 7.3, cervical radicular pain, basal cell CA of the scalp. 11/03/2018; type 2 diabetes on oral agents. These are 2 wounds in a greater saphenous vein harvest  site for a CABG on November 19. The more superior part of this on the surgical line is healed. He has a much better looking surface on this this week. He has been using Prisma under compression 1/22; great saphenous vein harvest  site is down to a single wound. Better looking surface although still requiring debridement. We have been using Prisma under Kerlix Coban. Change to Iodoflex under 3 layer compression. 1/29; grade harvest vein harvest site is down to a single shallow wound. Better looking wound surface. I changed to Iodoflex under 3 layer compression last week this seems to have helped. No debridement was necessary 2/5; great saphenous vein harvest site wound down about half the size of last time. Using Iodoflex under 3 layer compression 2/12; great saphenous vein harvest site. Arrived in clinic with some adherent Iodoflex and some nonviable skin. This was removed with the wound is closed Objective Constitutional Somewhat wide pulse pressure but the wound is closed. Pulse regular and within target range for patient.Marland Kitchen Respirations regular, non-labored and within target range.. Temperature is normal and within the target range for the patient.Marland Kitchen appears in no distress. Vitals Time Taken: 9:39 AM, Height: 69 in, Weight: 166 lbs, BMI: 24.5, Temperature: 97.9 F, Pulse: 70 bpm, Respiratory Rate: 16 breaths/min, Blood Pressure: 121/51 mmHg. Rickey Sherman, Rickey Sherman [place] ([id number]) Respiratory Respiratory effort is easy and symmetric bilaterally. Rate is normal at rest and on room air.. General Notes: Wound exam; the patient arrived with some adherent Iodoflex and some dry skin over the wound surface. This was gently removed with a #3 curette underneath the area was fully epithelialized and healed. No evidence of surrounding infection. This was a surgical wound. There is no edema Integumentary (Hair, Skin) Wound #1 status is Healed - Epithelialized. Original cause of wound was Surgical Injury. The wound  is located on the Left,Medial Lower Leg. The wound measures 0cm length x 0cm width x 0cm depth; 0cm^2 area and 0cm^3 volume. There is Fat Layer (Subcutaneous Tissue) Exposed exposed. There is no tunneling or undermining noted. There is a small amount of serous drainage noted. The wound margin is flat and intact. There is large (67-100%) pink granulation within the wound bed. There is a small (1-33%) amount of necrotic tissue within the wound bed including Adherent Slough. The periwound skin appearance exhibited: Dry/Scaly. The periwound skin appearance did not exhibit: Callus, Crepitus, Excoriation, Induration, Rash, Scarring, Maceration, Atrophie Blanche, Cyanosis, Ecchymosis, Hemosiderin Staining, Mottled, Pallor, Rubor, Erythema. Periwound temperature was noted as No Abnormality. Assessment Active Problems ICD-10 Disruption of external operation (surgical) wound, not elsewhere classified, subsequent encounter Non-pressure chronic ulcer of left calf with fat layer exposed Type 2 diabetes mellitus with other skin ulcer Diagnoses ICD-10 T81.31XD: Disruption of external operation (surgical) wound, not elsewhere classified, subsequent encounter L97.222: Non-pressure chronic ulcer of left calf with fat layer exposed E11.622: Type 2 diabetes mellitus with other skin ulcer Plan Discharge From [facility] Services: Discharge from [place] 1. The patient can be discharged from wound care center. 2. This was a traumatic/surgical wound. No secondary prevention. We did recommend keeping this area covered with a thick Band-Aid for 2 or 3-week Electronic Signature(s) Signed: 12/01/2018 6:19:03 PM By: Linton Ham MD Entered By: Linton Ham on 12/01/2018 10:39:46 Rickey Sherman, Rickey Sherman ([id number]) Rickey Sherman, Rickey Sherman ([id number]) -------------------------------------------------------------------------------- SuperBill Details Patient Name: Rickey Sherman, Rickey Sherman. Date of Service: 12/01/2018 Medical Record  Number: [id number] Patient Account Number: [id number] Date of Birth/Sex: 29-Jul-1946 (72 y.o. M) Treating RN: Cornell Barman Primary Care Provider: Harrel Lemon Other Clinician: Referring Provider: Harrel Lemon Treating Provider/Extender: Tito Dine in Treatment: 5 Diagnosis Coding ICD-10 Codes Code Description T81.31XD Disruption of external operation (surgical) wound, not elsewhere classified, subsequent encounter L97.222 Non-pressure chronic ulcer of left calf with  fat layer exposed E11.622 Type 2 diabetes mellitus with other skin ulcer Facility Procedures CPT4 Code: [id number] Description: [id number] - WOUND CARE VISIT-LEV 2 EST PT Modifier: Quantity: 1 Physician Procedures CPT4: Description Modifier Quantity Code [id number] - WC PHYS LEVEL 2 - EST PT 1 ICD-10 Diagnosis Description T81.31XD Disruption of external operation (surgical) wound, not elsewhere classified, subsequent encounter L97.222 Non-pressure chronic ulcer  of left calf with fat layer exposed Electronic Signature(s) Signed: 12/01/2018 6:19:03 PM By: Linton Ham MD Entered By: Linton Ham on 12/01/2018 10:40:47

## 2018-12-06 ENCOUNTER — Encounter: Payer: Medicare Other | Admitting: *Deleted

## 2018-12-06 DIAGNOSIS — E11621 Type 2 diabetes mellitus with foot ulcer: Secondary | ICD-10-CM | POA: Diagnosis not present

## 2018-12-06 DIAGNOSIS — Z951 Presence of aortocoronary bypass graft: Secondary | ICD-10-CM

## 2018-12-06 NOTE — Progress Notes (Signed)
Daily Session Note  Patient Details  Name: Rickey Sherman MRN: [id number] Date of Birth: 05/05/1946 Referring Provider:     Cardiac Rehab from 10/28/2018 in [facility]  Referring Provider  [place]      Encounter Date: 12/06/2018  Check In: Session Check In - 12/06/18 0744      Check-In   Supervising physician immediately available to respond to emergencies  See telemetry face sheet for immediately available ER MD    Location  [facility]    Staff Present  Kelly Hayes, BS, ACSM CEP, Exercise Physiologist;Jessica Hawkins, MA, RCEP, CCRP, Exercise Physiologist;Susanne Bice, RN, BSN, CCRP    Medication changes reported      No    Fall or balance concerns reported     No    Tobacco Cessation  No Change    Warm-up and Cool-down  Performed as group-led instruction    Resistance Training Performed  Yes    VAD Patient?  No    PAD/SET Patient?  No      Pain Assessment   Currently in Pain?  No/denies    Multiple Pain Sites  No          Social History   Tobacco Use  Smoking Status Never Smoker  Smokeless Tobacco Never Used    Goals Met:  Independence with exercise equipment Exercise tolerated well No report of cardiac concerns or symptoms Strength training completed today  Goals Unmet:  Not Applicable  Comments: Pt able to follow exercise prescription today without complaint.  Will continue to monitor for progression.    Dr. Mark Miller is Medical Director for [facility] and [facility]. 

## 2018-12-06 NOTE — Progress Notes (Signed)
Rickey Sherman, Rickey Sherman ([id number]) Visit Report for 12/01/2018 Arrival Information Details Patient Name: Rickey Sherman, Rickey Sherman. Date of Service: 12/01/2018 9:45 AM Medical Record Number: [id number] Patient Account Number: [id number] Date of Birth/Sex: Feb 15, 1946 (73 y.o. M) Treating RN: Rickey Sherman Primary Care Rickey Sherman: Rickey Sherman Other Clinician: Referring Rickey Sherman: Rickey Sherman Treating Rickey Sherman/Extender: Rickey Sherman in Treatment: 5 Visit Information History Since Last Visit Added or deleted any medications: No Patient Arrived: Ambulatory Any new allergies or adverse reactions: No Arrival Time: 09:39 Had a fall or experienced change in No Accompanied By: friend activities of daily living that may affect Transfer Assistance: None risk of falls: Patient Identification Verified: Yes Signs or symptoms of abuse/neglect since last visito No Secondary Verification Process Completed: Yes Hospitalized since last visit: No Implantable device outside of the clinic excluding No cellular tissue based products placed in the center since last visit: Has Dressing in Place as Prescribed: Yes Pain Present Now: No Electronic Signature(s) Signed: 12/01/2018 3:41:08 PM By: Rickey Sherman Rickey Sherman Entered By: Rickey Sherman on 12/01/2018 09:39:43 Rickey Sherman ([id number]) -------------------------------------------------------------------------------- Clinic Level of Care Assessment Details Patient Name: Rickey Sherman. Date of Service: 12/01/2018 9:45 AM Medical Record Number: [id number] Patient Account Number: [id number] Date of Birth/Sex: 1946-07-10 (73 y.o. M) Treating RN: Rickey Sherman Primary Care Rickey Sherman: Rickey Sherman Other Clinician: Referring Rickey Sherman: Rickey Sherman Treating Rickey Sherman/Extender: Rickey Sherman in Treatment: 5 Clinic Level of Care Assessment Items TOOL 4 Quantity Score []  - Use when only an EandM is performed on FOLLOW-UP visit  0 ASSESSMENTS - Nursing Assessment / Reassessment []  - Reassessment of Co-morbidities (includes updates in patient status) 0 X- 1 5 Reassessment of Adherence to Treatment Plan ASSESSMENTS - Wound and Skin Assessment / Reassessment X - Simple Wound Assessment / Reassessment - one wound 1 5 []  - 0 Complex Wound Assessment / Reassessment - multiple wounds []  - 0 Dermatologic / Skin Assessment (not related to wound area) ASSESSMENTS - Focused Assessment []  - Circumferential Edema Measurements - multi extremities 0 []  - 0 Nutritional Assessment / Counseling / Intervention []  - 0 Lower Extremity Assessment (monofilament, tuning fork, pulses) []  - 0 Peripheral Arterial Disease Assessment (using hand held doppler) ASSESSMENTS - Ostomy and/or Continence Assessment and Care []  - Incontinence Assessment and Management 0 []  - 0 Ostomy Care Assessment and Management (repouching, etc.) PROCESS - Coordination of Care X - Simple Patient / Family Education for ongoing care 1 15 []  - 0 Complex (extensive) Patient / Family Education for ongoing care []  - 0 Staff obtains Programmer, systems, Records, Test Results / Process Orders []  - 0 Staff telephones HHA, Nursing Homes / Clarify orders / etc []  - 0 Routine Transfer to another Facility (non-emergent condition) []  - 0 Routine Hospital Admission (non-emergent condition) []  - 0 New Admissions / Biomedical engineer / Ordering NPWT, Apligraf, etc. []  - 0 Emergency Hospital Admission (emergent condition) []  - 0 Simple Discharge Coordination Rickey Sherman, Rickey Sherman. ([id number]) X- 1 15 Complex (extensive) Discharge Coordination PROCESS - Special Needs []  - Pediatric / Minor Patient Management 0 []  - 0 Isolation Patient Management []  - 0 Hearing / Language / Visual special needs []  - 0 Assessment of Community assistance (transportation, D/C planning, etc.) []  - 0 Additional assistance / Altered mentation []  - 0 Support Surface(s) Assessment (bed,  cushion, seat, etc.) INTERVENTIONS - Wound Cleansing / Measurement X - Simple Wound Cleansing - one wound 1 5 []  - 0 Complex Wound Cleansing - multiple wounds X-  1 5 Wound Imaging (photographs - any number of wounds) []  - 0 Wound Tracing (instead of photographs) X- 1 5 Simple Wound Measurement - one wound []  - 0 Complex Wound Measurement - multiple wounds INTERVENTIONS - Wound Dressings []  - Small Wound Dressing one or multiple wounds 0 []  - 0 Medium Wound Dressing one or multiple wounds []  - 0 Large Wound Dressing one or multiple wounds []  - 0 Application of Medications - topical []  - 0 Application of Medications - injection INTERVENTIONS - Miscellaneous []  - External ear exam 0 []  - 0 Specimen Collection (cultures, biopsies, blood, body fluids, etc.) []  - 0 Specimen(s) / Culture(s) sent or taken to Lab for analysis []  - 0 Patient Transfer (multiple staff / Civil Service fast streamer / Similar devices) []  - 0 Simple Staple / Suture removal (25 or less) []  - 0 Complex Staple / Suture removal (26 or more) []  - 0 Hypo / Hyperglycemic Management (close monitor of Blood Glucose) []  - 0 Ankle / Brachial Index (ABI) - do not check if billed separately X- 1 5 Vital Signs Rickey Sherman, Rickey Sherman ([id number]) Has the patient been seen at the hospital within the last three years: Yes Total Score: 60 Level Of Care: New/Established - Level 2 Electronic Signature(s) Signed: 12/02/2018 8:43:41 AM By: Rickey Sherman, BSN, RN, CWS, Kim RN, BSN Entered By: Rickey Sherman, Rickey Sherman on 12/01/2018 10:14:33 Rickey Sherman ([id number]) -------------------------------------------------------------------------------- Encounter Discharge Information Details Patient Name: Rickey Sherman, Rickey Sherman. Date of Service: 12/01/2018 9:45 AM Medical Record Number: [id number] Patient Account Number: [id number] Date of Birth/Sex: Aug 13, 1946 (73 y.o. M) Treating RN: Rickey Sherman Primary Care Rickey Sherman: Rickey Sherman Other Clinician: Referring  Rickey Sherman: Rickey Sherman Treating Rickey Sherman/Extender: Rickey Sherman in Treatment: 5 Encounter Discharge Information Items Discharge Condition: Stable Ambulatory Status: Ambulatory Discharge Destination: Home Transportation: Private Auto Accompanied By: wife Schedule Follow-up Appointment: Yes Clinical Summary of Care: Electronic Signature(s) Signed: 12/02/2018 8:43:41 AM By: Rickey Sherman, BSN, RN, CWS, Kim RN, BSN Entered By: Rickey Sherman, Rickey Sherman on 12/01/2018 10:16:12 Rickey Sherman ([id number]) -------------------------------------------------------------------------------- Lower Extremity Assessment Details Patient Name: Rickey Sherman, Rickey Sherman. Date of Service: 12/01/2018 9:45 AM Medical Record Number: [id number] Patient Account Number: [id number] Date of Birth/Sex: 24-Aug-1946 (73 y.o. M) Treating RN: Army Melia Primary Care Danyka Merlin: Rickey Sherman Other Clinician: Referring Keamber Macfadden: Rickey Sherman Treating Caroll Cunnington/Extender: Rickey Sherman in Treatment: 5 Edema Assessment Assessed: [Left: No] [Right: No] [Left: Edema] [Right: :] Calf Left: Right: Point of Measurement: 33 cm From Medial Instep 33 cm cm Ankle Left: Right: Point of Measurement: 11 cm From Medial Instep 20 cm cm Vascular Assessment Pulses: Dorsalis Pedis Palpable: [Left:Yes] Posterior Tibial Extremity colors, hair growth, and conditions: Extremity Color: [Left:Normal] Hair Growth on Extremity: [Left:Yes] Temperature of Extremity: [Left:Warm] Capillary Refill: [Left:< 3 seconds] Toe Nail Assessment Left: Right: Thick: No Discolored: No Deformed: No Improper Length and Hygiene: No Electronic Signature(s) Signed: 12/06/2018 8:46:05 AM By: Army Melia Entered By: Army Melia on 12/01/2018 09:49:25 Rickey Sherman ([id number]) -------------------------------------------------------------------------------- Multi Wound Chart Details Patient Name: Rickey Sherman. Date of Service: 12/01/2018 9:45  AM Medical Record Number: [id number] Patient Account Number: [id number] Date of Birth/Sex: 04/21/1946 (73 y.o. M) Treating RN: Rickey Sherman Primary Care Lidwina Kaner: Rickey Sherman Other Clinician: Referring Jodel Mayhall: Rickey Sherman Treating Dua Mehler/Extender: Rickey Sherman in Treatment: 5 Vital Signs Height(in): 69 Pulse(bpm): 70 Weight(lbs): 166 Blood Pressure(mmHg): 121/51 Body Mass Index(BMI): 25 Temperature(F): 97.9 Respiratory Rate 16 (breaths/min): Photos: [1:No Photos] [N/A:N/A] Wound Location: [1:Left, Medial  Lower Leg] [N/A:N/A] Wounding Event: [1:Surgical Injury] [N/A:N/A] Primary Etiology: [1:Diabetic Wound/Ulcer of the Lower Extremity] [N/A:N/A] Comorbid History: [1:Coronary Artery Disease, Hypertension, Type II Diabetes, Gout] [N/A:N/A] Date Acquired: [1:08/31/2018] [N/A:N/A] Weeks of Treatment: [1:5] [N/A:N/A] Wound Status: [1:Healed - Epithelialized] [N/A:N/A] Measurements L x W x D [1:0x0x0] [N/A:N/A] (cm) Area (cm) : [1:0] [N/A:N/A] Volume (cm) : [1:0] [N/A:N/A] % Reduction in Area: [1:100.00%] [N/A:N/A] % Reduction in Volume: [1:100.00%] [N/A:N/A] Classification: [1:Grade 2] [N/A:N/A] Exudate Amount: [1:Small] [N/A:N/A] Exudate Type: [1:Serous] [N/A:N/A] Exudate Color: [1:amber] [N/A:N/A] Wound Margin: [1:Flat and Intact] [N/A:N/A] Granulation Amount: [1:Large (67-100%)] [N/A:N/A] Granulation Quality: [1:Pink] [N/A:N/A] Necrotic Amount: [1:Small (1-33%)] [N/A:N/A] Exposed Structures: [1:Fat Layer (Subcutaneous Tissue) Exposed: Yes Fascia: No Tendon: No Muscle: No Joint: No Bone: No] [N/A:N/A] Epithelialization: [1:Small (1-33%)] [N/A:N/A] Periwound Skin Texture: [1:Excoriation: No Induration: No Callus: No Crepitus: No] [N/A:N/A] Rash: No Scarring: No Periwound Skin Moisture: Dry/Scaly: Yes N/A N/A Maceration: No Periwound Skin Color: Atrophie Blanche: No N/A N/A Cyanosis: No Ecchymosis: No Erythema: No Hemosiderin Staining:  No Mottled: No Pallor: No Rubor: No Temperature: No Abnormality N/A N/A Tenderness on Palpation: No N/A N/A Wound Preparation: Ulcer Cleansing: N/A N/A Rinsed/Irrigated with Saline, Other: soap and water Topical Anesthetic Applied: Other: lidocaine 4% Treatment Notes Electronic Signature(s) Signed: 12/01/2018 6:19:03 PM By: Linton Ham MD Entered By: Linton Ham on 12/01/2018 10:35:11 Rickey Sherman ([id number]) -------------------------------------------------------------------------------- [place] Details Patient Name: Rickey Sherman, Rickey Sherman. Date of Service: 12/01/2018 9:45 AM Medical Record Number: [id number] Patient Account Number: [id number] Date of Birth/Sex: January 01, 1946 (73 y.o. M) Treating RN: Rickey Sherman Primary Care Diarra Ceja: Rickey Sherman Other Clinician: Referring Qaadir Kent: Rickey Sherman Treating Ulonda Klosowski/Extender: Rickey Sherman in Treatment: 5 Active Inactive Electronic Signature(s) Signed: 12/02/2018 8:43:41 AM By: Rickey Sherman, BSN, RN, CWS, Kim RN, BSN Entered By: Rickey Sherman, Rickey Sherman on 12/01/2018 10:13:35 Rickey Sherman, Rickey Sherman ([id number]) -------------------------------------------------------------------------------- Pain Assessment Details Patient Name: Rickey Sherman, Rickey Sherman. Date of Service: 12/01/2018 9:45 AM Medical Record Number: [id number] Patient Account Number: [id number] Date of Birth/Sex: [birth year] (73 y.o. M) Treating RN: Rickey Sherman Primary Care Devontae Casasola: Rickey Sherman Other Clinician: Referring Sanchez Hemmer: Rickey Sherman Treating Josephanthony Tindel/Extender: Rickey Sherman in Treatment: 5 Active Problems Location of Pain Severity and Description of Pain Patient Has Paino No Site Locations Pain Management and Medication Current Pain Management: Electronic Signature(s) Signed: 12/01/2018 3:41:08 PM By: Rickey Sherman Rickey Sherman Signed: 12/02/2018 8:43:41 AM By: Rickey Sherman, BSN, RN, CWS, Kim RN, BSN Entered By: Rickey Sherman on 12/01/2018 09:39:50 Rickey Sherman ([id number]) -------------------------------------------------------------------------------- Patient/Caregiver Education Details Patient Name: Rickey Sherman, Rickey Sherman. Date of Service: 12/01/2018 9:45 AM Medical Record Number: [id number] Patient Account Number: [id number] Date of Birth/Gender: Sep 05, 1946 (73 y.o. M) Treating RN: Rickey Sherman Primary Care Physician: Rickey Sherman Other Clinician: Referring Physician: Harrel Sherman Treating Physician/Extender: Rickey Sherman in Treatment: 5 Education Assessment Education Provided To: Patient Education Topics Provided Engineer, maintenance) Signed: 12/02/2018 8:43:41 AM By: Rickey Sherman, BSN, RN, CWS, Kim RN, BSN Entered By: Rickey Sherman, Rickey Sherman on 12/01/2018 10:16:36 Rickey Sherman ([id number]) -------------------------------------------------------------------------------- Wound Assessment Details Patient Name: Rickey Sherman, MABIE. Date of Service: 12/01/2018 9:45 AM Medical Record Number: [id number] Patient Account Number: [id number] Date of Birth/Sex: 1946-06-08 (73 y.o. M) Treating RN: Rickey Sherman Primary Care Shashana Fullington: Rickey Sherman Other Clinician: Referring Margrett Kalb: Rickey Sherman Treating Evalie Hargraves/Extender: Rickey Sherman in Treatment: 5 Wound Status Wound Number: 1 Primary Diabetic Wound/Ulcer of the Lower Extremity Etiology: Wound Location: Left, Medial Lower Leg Wound  Status: Healed - Epithelialized Wounding Event: Surgical Injury Comorbid Coronary Artery Disease, Hypertension, Type Date Acquired: 08/31/2018 History: II Diabetes, Gout Weeks Of Treatment: 5 Clustered Wound: No Photos Photo Uploaded By: Army Melia on 12/02/2018 08:59:52 Wound Measurements Length: (cm) 0 % Reduc Width: (cm) 0 % Reduc Depth: (cm) 0 Epithel Area: (cm) 0 Tunnel Volume: (cm) 0 Underm tion in Area: 100% tion in Volume: 100% ialization: Small (1-33%) ing: No ining:  No Wound Description Classification: Grade 2 Wound Margin: Flat and Intact Exudate Amount: Small Exudate Type: Serous Exudate Color: amber Foul Odor After Cleansing: No Slough/Fibrino Yes Wound Bed Granulation Amount: Large (67-100%) Exposed Structure Granulation Quality: Pink Fascia Exposed: No Necrotic Amount: Small (1-33%) Fat Layer (Subcutaneous Tissue) Exposed: Yes Necrotic Quality: Adherent Slough Tendon Exposed: No Muscle Exposed: No Joint Exposed: No Bone Exposed: No Periwound Skin Texture RHYKER, SILVERSMITH R. ([id number]) Texture Color No Abnormalities Noted: No No Abnormalities Noted: No Callus: No Atrophie Blanche: No Crepitus: No Cyanosis: No Excoriation: No Ecchymosis: No Induration: No Erythema: No Rash: No Hemosiderin Staining: No Scarring: No Mottled: No Pallor: No Moisture Rubor: No No Abnormalities Noted: No Dry / Scaly: Yes Temperature / Pain Maceration: No Temperature: No Abnormality Wound Preparation Ulcer Cleansing: Rinsed/Irrigated with Saline, Other: soap and water, Topical Anesthetic Applied: Other: lidocaine 4%, Electronic Signature(s) Signed: 12/02/2018 8:43:41 AM By: Rickey Sherman, BSN, RN, CWS, Kim RN, BSN Entered By: Rickey Sherman, Rickey Sherman on 12/01/2018 10:12:38 ANDRANIK, JEUNE ([id number]) -------------------------------------------------------------------------------- Vitals Details Patient Name: CLEMMIE, MARXEN. Date of Service: 12/01/2018 9:45 AM Medical Record Number: [id number] Patient Account Number: [id number] Date of Birth/Sex: 02/12/1946 (73 y.o. M) Treating RN: Rickey Sherman Primary Care Aleni Andrus: Rickey Sherman Other Clinician: Referring Jujhar Everett: Rickey Sherman Treating Ryliee Figge/Extender: Rickey Sherman in Treatment: 5 Vital Signs Time Taken: 09:39 Temperature (F): 97.9 Height (in): 69 Pulse (bpm): 70 Weight (lbs): 166 Respiratory Rate (breaths/min): 16 Body Mass Index (BMI): 24.5 Blood Pressure (mmHg):  121/51 Reference Range: 80 - 120 mg / dl Airway Electronic Signature(s) Signed: 12/01/2018 3:41:08 PM By: Rickey Sherman Rickey Sherman Entered By: Rickey Sherman on 12/01/2018 09:41:37

## 2018-12-08 ENCOUNTER — Encounter: Payer: Medicare Other | Admitting: Internal Medicine

## 2018-12-08 DIAGNOSIS — E11621 Type 2 diabetes mellitus with foot ulcer: Secondary | ICD-10-CM | POA: Diagnosis not present

## 2018-12-08 DIAGNOSIS — Z951 Presence of aortocoronary bypass graft: Secondary | ICD-10-CM

## 2018-12-08 NOTE — Progress Notes (Signed)
Daily Session Note  Patient Details  Name: Rickey Sherman MRN: [id number] Date of Birth: Jan 06, 1946 Referring Provider:     Cardiac Rehab from 10/28/2018 in [facility] Cardiac and Pulmonary Rehab  Referring Provider  Edwina Barth      Encounter Date: 12/08/2018  Check In: Session Check In - 12/08/18 0742      Check-In   Supervising physician immediately available to respond to emergencies  See telemetry face sheet for immediately available ER MD    Location  [facility]-Cardiac & Pulmonary Rehab    Staff Present  Justin Mend RCP,RRT,BSRT;Jessica Luan Pulling, MA, RCEP, CCRP, Exercise Physiologist;Susanne Bice, RN, BSN, CCRP    Medication changes reported      No    Fall or balance concerns reported     No    Warm-up and Cool-down  Performed as group-led instruction    Resistance Training Performed  Yes    VAD Patient?  No    PAD/SET Patient?  No      Pain Assessment   Currently in Pain?  No/denies          Social History   Tobacco Use  Smoking Status Never Smoker  Smokeless Tobacco Never Used    Goals Met:  Independence with exercise equipment Exercise tolerated well No report of cardiac concerns or symptoms Strength training completed today  Goals Unmet:  Not Applicable  Comments: Pt able to follow exercise prescription today without complaint.  Will continue to monitor for progression.    Dr. Emily Filbert is Medical Director for [facility] and [facility].

## 2018-12-13 ENCOUNTER — Encounter: Payer: Medicare Other | Admitting: *Deleted

## 2018-12-13 DIAGNOSIS — E11621 Type 2 diabetes mellitus with foot ulcer: Secondary | ICD-10-CM | POA: Diagnosis not present

## 2018-12-13 DIAGNOSIS — Z951 Presence of aortocoronary bypass graft: Secondary | ICD-10-CM

## 2018-12-13 NOTE — Progress Notes (Signed)
Daily Session Note  Patient Details  Name: Rickey Sherman MRN: [id number] Date of Birth: [birth year] Referring Provider:     Cardiac Rehab from 10/28/2018 in [facility] Cardiac and Pulmonary Rehab  Referring Provider  Edwina Barth      Encounter Date: 12/13/2018  Check In: Session Check In - 12/13/18 [name]      Check-In   Supervising physician immediately available to respond to emergencies  See telemetry face sheet for immediately available ER MD    Location  [facility]-Cardiac & Pulmonary Rehab    Staff Present  Earlean Shawl, BS, ACSM CEP, Exercise Physiologist;Jessica Luan Pulling, MA, RCEP, CCRP, Exercise Physiologist;Susanne Bice, RN, BSN, CCRP    Medication changes reported      No    Fall or balance concerns reported     No    Tobacco Cessation  No Change    Warm-up and Cool-down  Performed as group-led instruction    Resistance Training Performed  Yes    VAD Patient?  No    PAD/SET Patient?  No      Pain Assessment   Currently in Pain?  No/denies    Multiple Pain Sites  No          Social History   Tobacco Use  Smoking Status Never Smoker  Smokeless Tobacco Never Used    Goals Met:  Independence with exercise equipment Exercise tolerated well No report of cardiac concerns or symptoms Strength training completed today  Goals Unmet:  Not Applicable  Comments: Pt able to follow exercise prescription today without complaint.  Will continue to monitor for progression.    Dr. Emily Filbert is Medical Director for [facility].

## 2018-12-15 ENCOUNTER — Encounter: Payer: Medicare Other | Admitting: Internal Medicine

## 2018-12-15 ENCOUNTER — Encounter: Payer: Self-pay | Admitting: *Deleted

## 2018-12-15 VITALS — Ht 70.5 in | Wt 161.0 lb

## 2018-12-15 DIAGNOSIS — Z951 Presence of aortocoronary bypass graft: Secondary | ICD-10-CM

## 2018-12-15 DIAGNOSIS — E11621 Type 2 diabetes mellitus with foot ulcer: Secondary | ICD-10-CM | POA: Diagnosis not present

## 2018-12-15 NOTE — Progress Notes (Signed)
Cardiac Individual Treatment Plan  Patient Details  Name: Rickey Sherman MRN: [id number] Date of Birth: Sep 20, 1946 Referring Provider:     Cardiac Rehab from 10/28/2018 in [facility]  Referring Provider  Edwina Barth      Initial Encounter Date:    Cardiac Rehab from 10/28/2018 in [facility] Cardiac and Pulmonary Rehab  Date  10/28/18      Visit Diagnosis: S/P CABG x 4  Patient's Home Medications on Admission:  Current Outpatient Medications:  .  acetaminophen (TYLENOL) 500 MG tablet, Take 1,000 mg by mouth daily as needed for moderate pain or headache., Disp: , Rfl:  .  allopurinol (ZYLOPRIM) 100 MG tablet, Take 100 mg by mouth every evening., Disp: , Rfl:  .  allopurinol (ZYLOPRIM) 100 MG tablet, TAKE 1 TABLET BY MOUTH ONCE DAILY, Disp: , Rfl:  .  aspirin EC 81 MG tablet, Take 81 mg by mouth every evening., Disp: , Rfl:  .  atorvastatin (LIPITOR) 80 MG tablet, Take by mouth., Disp: , Rfl:  .  B-D ULTRA-FINE 33 LANCETS MISC, Inject into the skin., Disp: , Rfl:  .  Blood Glucose Monitoring Suppl (ONE TOUCH ULTRA 2) w/Device KIT, Use as instructed., Disp: , Rfl:  .  empagliflozin (JARDIANCE) 25 MG TABS tablet, Take by mouth., Disp: , Rfl:  .  fluorouracil (EFUDEX) 5 % cream, Apply 1 application topically daily as needed (skin spots). , Disp: , Rfl: 1 .  glipiZIDE (GLUCOTROL) 10 MG tablet, TAKE 1 TABLET BY MOUTH TWO  TIMES DAILY BEFORE MEALS, Disp: , Rfl:  .  glucose blood (ONE TOUCH ULTRA TEST) test strip, Use once daily Use as instructed., Disp: , Rfl:  .  ibuprofen (ADVIL,MOTRIN) 200 MG tablet, Take 400 mg by mouth daily as needed for moderate pain., Disp: , Rfl:  .  isosorbide mononitrate (IMDUR) 30 MG 24 hr tablet, Take 30 mg by mouth daily., Disp: , Rfl:  .  isosorbide mononitrate (IMDUR) 30 MG 24 hr tablet, Take by mouth., Disp: , Rfl:  .  Lancets (ONETOUCH DELICA PLUS OMVEHM09O) MISC, , Disp: , Rfl:  .  losartan (COZAAR) 100 MG tablet, Take 100 mg by mouth daily.,  Disp: , Rfl:  .  losartan (COZAAR) 100 MG tablet, TAKE 1 TABLET BY MOUTH ONCE DAILY, Disp: , Rfl:  .  metFORMIN (GLUCOPHAGE) 1000 MG tablet, Take 1,000 mg by mouth 2 (two) times daily., Disp: , Rfl:  .  metoprolol succinate (TOPROL-XL) 25 MG 24 hr tablet, Take 25 mg by mouth daily., Disp: , Rfl:  .  metoprolol succinate (TOPROL-XL) 25 MG 24 hr tablet, Take by mouth., Disp: , Rfl:  .  metoprolol tartrate (LOPRESSOR) 25 MG tablet, Take by mouth 2 (two) times daily. , Disp: , Rfl:  .  omeprazole (PRILOSEC) 40 MG capsule, TAKE 1 CAPSULE BY MOUTH  ONCE A DAY, Disp: , Rfl:  .  pioglitazone (ACTOS) 15 MG tablet, Take 15 mg by mouth every evening., Disp: , Rfl:  .  pioglitazone (ACTOS) 15 MG tablet, Take by mouth., Disp: , Rfl:  .  vitamin B-12 (CYANOCOBALAMIN) 1000 MCG tablet, Take by mouth., Disp: , Rfl:   Past Medical History: Past Medical History:  Diagnosis Date  . Cancer ([place])    basal cell carcinoma  . Coronary artery disease   . Diabetes mellitus without complication ([place])   . Hypertension     Tobacco Use: Social History   Tobacco Use  Smoking Status Never Smoker  Smokeless Tobacco Never  Used    Labs: Recent Review Flowsheet Data    There is no flowsheet data to display.       Exercise Target Goals: Exercise Program Goal: Individual exercise prescription set using results from initial 6 min walk test and THRR while considering  patient's activity barriers and safety.   Exercise Prescription Goal: Initial exercise prescription builds to 30-45 minutes a day of aerobic activity, 2-3 days per week.  Home exercise guidelines will be given to patient during program as part of exercise prescription that the participant will acknowledge.  Activity Barriers & Risk Stratification: Activity Barriers & Cardiac Risk Stratification - 10/28/18 1532      Activity Barriers & Cardiac Risk Stratification   Activity Barriers  Other (comment)    Comments  leg incision still healing     Cardiac Risk Stratification  High       6 Minute Walk: 6 Minute Walk    Row Name 10/28/18 1502         6 Minute Walk   Phase  Initial     Distance  1520 feet     Walk Time  6 minutes     # of Rest Breaks  0     MPH  2.88     METS  3.93     RPE  12     Perceived Dyspnea   0     VO2 Peak  13.8     Symptoms  No     Resting HR  63 bpm     Resting BP  110/58     Resting Oxygen Saturation   99 %     Exercise Oxygen Saturation  during 6 min walk  99 %     Max Ex. HR  117 bpm     Max Ex. BP  156/58     2 Minute Post BP  126/58        Oxygen Initial Assessment:   Oxygen Re-Evaluation:   Oxygen Discharge (Final Oxygen Re-Evaluation):   Initial Exercise Prescription: Initial Exercise Prescription - 10/28/18 1500      Date of Initial Exercise RX and Referring Provider   Date  10/28/18    Referring Provider  Edwina Barth      Treadmill   MPH  2.8    Grade  1.5    Minutes  15    METs  3.7      Recumbant Bike   Level  4    RPM  60    Watts  46    Minutes  15    METs  3.7      REL-XR   Level  3    Speed  50    Minutes  15    METs  3.7      Prescription Details   Frequency (times per week)  3    Duration  Progress to 45 minutes of aerobic exercise without signs/symptoms of physical distress      Intensity   THRR 40-80% of Max Heartrate  97-132    Ratings of Perceived Exertion  11-13    Perceived Dyspnea  0-4      Resistance Training   Training Prescription  Yes    Weight  3 lb    Reps  10-15       Perform Capillary Blood Glucose checks as needed.  Exercise Prescription Changes: Exercise Prescription Changes    Row Name 10/28/18 1500 11/10/18 0900 11/23/18 0900 12/08/18 1700  Response to Exercise   Blood Pressure (Admit)  110/58  122/60  114/60  132/74    Blood Pressure (Exercise)  156/58  138/52  124/70  128/70    Blood Pressure (Exit)  126/58  132/70  102/62  108/50    Heart Rate (Admit)  64 bpm  67 bpm  73 bpm  63 bpm    Heart Rate  (Exercise)  107 bpm  129 bpm  119 bpm  132 bpm    Heart Rate (Exit)  71 bpm  81 bpm  79 bpm  89 bpm    Oxygen Saturation (Admit)  99 %  -  -  -    Oxygen Saturation (Exit)  99 %  -  -  -    Rating of Perceived Exertion (Exercise)  12  15  12  14     Symptoms  -  none  none  none    Duration  -  Continue with 45 min of aerobic exercise without signs/symptoms of physical distress.  Continue with 45 min of aerobic exercise without signs/symptoms of physical distress.  Continue with 45 min of aerobic exercise without signs/symptoms of physical distress.    Intensity  -  THRR unchanged  THRR unchanged  THRR unchanged      Progression   Progression  -  Continue to progress workloads to maintain intensity without signs/symptoms of physical distress.  Continue to progress workloads to maintain intensity without signs/symptoms of physical distress.  Continue to progress workloads to maintain intensity without signs/symptoms of physical distress.    Average METs  -  4.07  3.96  7.59      Resistance Training   Training Prescription  -  Yes  Yes  Yes    Weight  -  3 lbs  7 lbs  5 lbs    Reps  -  10-15  10-15  10-15      Interval Training   Interval Training  -  No  No  No      Treadmill   MPH  -  2.8  3  3     Grade  -  1  2  8     Minutes  -  15  15  15     METs  -  3.53  4.12  6.6      Recumbant Bike   Level  -  2  6  6     Watts  -  36  36  57    Minutes  -  15  15  15     METs  -  3.47  3.47  4.46      REL-XR   Level  -  3  3  8     Minutes  -  15  15  15     METs  -  5.2  4.3  11.7      Home Exercise Plan   Plans to continue exercise at  -  Home (comment) walking and weights  Home (comment) walking and weights  Home (comment) walking and weights    Frequency  -  Add 2 additional days to program exercise sessions.  Add 2 additional days to program exercise sessions.  Add 2 additional days to program exercise sessions.    Initial Home Exercises Provided  -  11/10/18  11/10/18  11/10/18        Exercise Comments: Exercise Comments    Row Name 11/01/18 [phone]  Exercise Comments  First full day of exercise!  Patient was oriented to gym and equipment including functions, settings, policies, and procedures.  Patient's individual exercise prescription and treatment plan were reviewed.  All starting workloads were established based on the results of the 6 minute walk test done at initial orientation visit.  The plan for exercise progression was also introduced and progression will be customized based on patient's performance and goals.          Exercise Goals and Review: Exercise Goals    Row Name 10/28/18 1501             Exercise Goals   Increase Physical Activity  Yes       Intervention  Provide advice, education, support and counseling about physical activity/exercise needs.;Develop an individualized exercise prescription for aerobic and resistive training based on initial evaluation findings, risk stratification, comorbidities and participant's personal goals.       Expected Outcomes  Short Term: Attend rehab on a regular basis to increase amount of physical activity.;Long Term: Add in home exercise to make exercise part of routine and to increase amount of physical activity.;Long Term: Exercising regularly at least 3-5 days a week.       Increase Strength and Stamina  Yes       Intervention  Provide advice, education, support and counseling about physical activity/exercise needs.;Develop an individualized exercise prescription for aerobic and resistive training based on initial evaluation findings, risk stratification, comorbidities and participant's personal goals.       Expected Outcomes  Short Term: Increase workloads from initial exercise prescription for resistance, speed, and METs.;Short Term: Perform resistance training exercises routinely during rehab and add in resistance training at home;Long Term: Improve cardiorespiratory fitness, muscular endurance and  strength as measured by increased METs and functional capacity (6MWT)       Able to understand and use rate of perceived exertion (RPE) scale  Yes       Intervention  Provide education and explanation on how to use RPE scale       Expected Outcomes  Short Term: Able to use RPE daily in rehab to express subjective intensity level;Long Term:  Able to use RPE to guide intensity level when exercising independently       Knowledge and understanding of Target Heart Rate Range (THRR)  Yes       Intervention  Provide education and explanation of THRR including how the numbers were predicted and where they are located for reference       Expected Outcomes  Short Term: Able to state/look up THRR;Short Term: Able to use daily as guideline for intensity in rehab;Long Term: Able to use THRR to govern intensity when exercising independently       Able to check pulse independently  Yes       Intervention  Provide education and demonstration on how to check pulse in carotid and radial arteries.;Review the importance of being able to check your own pulse for safety during independent exercise       Expected Outcomes  Short Term: Able to explain why pulse checking is important during independent exercise;Long Term: Able to check pulse independently and accurately       Understanding of Exercise Prescription  Yes       Intervention  Provide education, explanation, and written materials on patient's individual exercise prescription       Expected Outcomes  Short Term: Able to explain program exercise prescription;Long Term: Able to explain home exercise  prescription to exercise independently          Exercise Goals Re-Evaluation : Exercise Goals Re-Evaluation    Row Name 11/01/18 0849 11/10/18 0908 11/23/18 0925 11/26/18 0828 12/08/18 1704     Exercise Goal Re-Evaluation   Exercise Goals Review  Increase Physical Activity;Increase Strength and Stamina;Able to understand and use rate of perceived exertion (RPE)  scale;Knowledge and understanding of Target Heart Rate Range (THRR);Understanding of Exercise Prescription  Increase Physical Activity;Increase Strength and Stamina;Able to understand and use rate of perceived exertion (RPE) scale;Knowledge and understanding of Target Heart Rate Range (THRR);Able to check pulse independently;Understanding of Exercise Prescription  Increase Physical Activity;Increase Strength and Stamina;Understanding of Exercise Prescription  Increase Physical Activity;Increase Strength and Stamina;Understanding of Exercise Prescription  Increase Physical Activity;Increase Strength and Stamina;Understanding of Exercise Prescription   Comments  Reviewed RPE scale, THR and program prescription with pt today.  Pt voiced understanding and was given a copy of goals to take home.   Reviewed home exercise with pt today.  Pt plans to walk and use weights at home for exercise.  Reviewed THR, pulse, RPE, sign and symptoms, and when to call 911 or MD.  Also discussed weather considerations and indoor options.  Pt voiced understanding.  Pieter is doing well in rehab.  He is now up level 6 on the recumbent bike and using 7 lbs hand weights.  We will continue to monitor his progress.   Quintez is doing well in rehab.  He is getting in 58mn a day on the [employer] bike at home.  He plans to start walking once the weather gets better.  He has also been nursing a leg wound that has been holding him.  We talked about adding in another round on the [employer] to get his total 30 min.  He is also looking forwad to playing golf again and he practices for two hours a day on his non playing days.   He is getting his strength and stamina back slowly but surely.   JMyrlecontinues to do well in rehab.  He has now started doing intervals and has found them challenging.  We will continue to encourage his intervals and continue to monitor his progress.    Expected Outcomes  Short: Use RPE daily to regulate intensity. Long: Follow  program prescription in THR.  Short: Start to add in more walking at home.  Long: Continue to add in exercise at home.  Short: Talk about adding in intervals.  Long: Continue to increase strength and stamina.   Short: Add in another round on the bike at home. Long: Continue to increase strength and stamina.   Short: Continue with intervals.  Long: Continue to increase strength and stamina.       Discharge Exercise Prescription (Final Exercise Prescription Changes): Exercise Prescription Changes - 12/08/18 1700      Response to Exercise   Blood Pressure (Admit)  132/74    Blood Pressure (Exercise)  128/70    Blood Pressure (Exit)  108/50    Heart Rate (Admit)  63 bpm    Heart Rate (Exercise)  132 bpm    Heart Rate (Exit)  89 bpm    Rating of Perceived Exertion (Exercise)  14    Symptoms  none    Duration  Continue with 45 min of aerobic exercise without signs/symptoms of physical distress.    Intensity  THRR unchanged      Progression   Progression  Continue to progress workloads to maintain  intensity without signs/symptoms of physical distress.    Average METs  7.59      Resistance Training   Training Prescription  Yes    Weight  5 lbs    Reps  10-15      Interval Training   Interval Training  No      Treadmill   MPH  3    Grade  8    Minutes  15    METs  6.6      Recumbant Bike   Level  6    Watts  57    Minutes  15    METs  4.46      REL-XR   Level  8    Minutes  15    METs  11.7      Home Exercise Plan   Plans to continue exercise at  Home (comment)   walking and weights   Frequency  Add 2 additional days to program exercise sessions.    Initial Home Exercises Provided  11/10/18       Nutrition:  Target Goals: Understanding of nutrition guidelines, daily intake of sodium <1536m, cholesterol <2067m calories 30% from fat and 7% or less from saturated fats, daily to have 5 or more servings of fruits and vegetables.  Biometrics: Pre Biometrics - 10/28/18  1500      Pre Biometrics   Height  5' 10.5" (1.791 m)    Weight  165 lb (74.8 kg)    Waist Circumference  37 inches    Hip Circumference  38.5 inches    Waist to Hip Ratio  0.96 %    BMI (Calculated)  23.33    Single Leg Stand  25 seconds        Nutrition Therapy Plan and Nutrition Goals: Nutrition Therapy & Goals - 10/28/18 1529      Intervention Plan   Intervention  Prescribe, educate and counsel regarding individualized specific dietary modifications aiming towards targeted core components such as weight, hypertension, lipid management, diabetes, heart failure and other comorbidities.;Nutrition handout(s) given to patient.    Expected Outcomes  Short Term Goal: Understand basic principles of dietary content, such as calories, fat, sodium, cholesterol and nutrients.;Short Term Goal: A plan has been developed with personal nutrition goals set during dietitian appointment.;Long Term Goal: Adherence to prescribed nutrition plan.       Nutrition Assessments: Nutrition Assessments - 10/28/18 1529      MEDFICTS Scores   Pre Score  60       Nutrition Goals Re-Evaluation: Nutrition Goals Re-Evaluation    [place] 11/26/18 0836             Goals   Nutrition Goal  Increase fluid intake, start to watch salt in take more closely, eat more complex carbs       Comment  JaReseanas been working on his diet.  He has been able to improve his blood sugars and dropped his A1c under six.  He keeps a close eye on his carbs.  However, he is still getting a lot of simple carbs which will spike his sugars.  We talked about options for more complex carbs including popcorn.  We also talk about adding in protein with his simple carbs for better balance.  We talked about reading food lables to monitor his salt intake as well.  He is also going to try to make sure that he is getting enough fluid as well.  Expected Outcome  Short: Try more complex carbs.  Long: Watch sodium intake more closely.            Nutrition Goals Discharge (Final Nutrition Goals Re-Evaluation): Nutrition Goals Re-Evaluation - 11/26/18 0836      Goals   Nutrition Goal  Increase fluid intake, start to watch salt in take more closely, eat more complex carbs    Comment  Jackston has been working on his diet.  He has been able to improve his blood sugars and dropped his A1c under six.  He keeps a close eye on his carbs.  However, he is still getting a lot of simple carbs which will spike his sugars.  We talked about options for more complex carbs including popcorn.  We also talk about adding in protein with his simple carbs for better balance.  We talked about reading food lables to monitor his salt intake as well.  He is also going to try to make sure that he is getting enough fluid as well.     Expected Outcome  Short: Try more complex carbs.  Long: Watch sodium intake more closely.        Psychosocial: Target Goals: Acknowledge presence or absence of significant depression and/or stress, maximize coping skills, provide positive support system. Participant is able to verbalize types and ability to use techniques and skills needed for reducing stress and depression.   Initial Review & Psychosocial Screening: Initial Psych Review & Screening - 10/28/18 1526      Initial Review   Current issues with  Current Stress Concerns    Source of Stress Concerns  Unable to participate in former interests or hobbies;Unable to perform yard/household activities    Comments  He is almost 2 months post CABG. 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 He is used to playing golf about 3 x a week and lives a social active lifestyle.       Family Dynamics   Good Support System?  Yes   Life partner Parke Simmers)     Barriers   Psychosocial barriers to participate in program  There are no identifiable barriers or psychosocial needs.;The patient should benefit from training in stress management and  relaxation.      Screening Interventions   Interventions  Encouraged to exercise;Program counselor consult;To provide support and resources with identified psychosocial needs;Provide feedback about the scores to participant    Expected Outcomes  Short Term goal: Utilizing psychosocial counselor, staff and physician to assist with identification of specific Stressors or current issues interfering with healing process. Setting desired goal for each stressor or current issue identified.;Long Term Goal: Stressors or current issues are controlled or eliminated.;Short Term goal: Identification and review with participant of any Quality of Life or Depression concerns found by scoring the questionnaire.;Long Term goal: The participant improves quality of Life and PHQ9 Scores as seen by post scores and/or verbalization of changes       Quality of Life Scores:  Quality of Life - 10/28/18 1529      Quality of Life   Select  Quality of Life      Quality of Life Scores   Health/Function Pre  26.8 %    Socioeconomic Pre  28.63 %    Psych/Spiritual Pre  26.57 %    Family Pre  28.8 %    GLOBAL Pre  27.46 %      Scores of 19 and  below usually indicate a poorer quality of life in these areas.  A difference of  2-3 points is a clinically meaningful difference.  A difference of 2-3 points in the total score of the Quality of Life Index has been associated with significant improvement in overall quality of life, self-image, physical symptoms, and general health in studies assessing change in quality of life.  PHQ-9: Recent Review Flowsheet Data    Depression screen [facility] 2/9 10/28/2018   Decreased Interest 0   Down, Depressed, Hopeless 0   PHQ - 2 Score 0   Altered sleeping 1   Tired, decreased energy 0   Change in appetite 0   Feeling bad or failure about yourself  1   Trouble concentrating 0   Moving slowly or fidgety/restless 0   Suicidal thoughts 0   PHQ-9 Score 2   Difficult doing work/chores Not  difficult at all     Interpretation of Total Score  Total Score Depression Severity:  1-4 = Minimal depression, 5-9 = Mild depression, 10-14 = Moderate depression, 15-19 = Moderately severe depression, 20-27 = Severe depression   Psychosocial Evaluation and Intervention: Psychosocial Evaluation - 11/01/18 0927      Psychosocial Evaluation & Interventions   Interventions  Stress management education;Encouraged to exercise with the program and follow exercise prescription    Comments  Counselor met with Mr. Lapaglia Frisbee) today for initial psychosocial evaluation.  He is a 73 year old who had a CABGx4 on 11/12.  He also is a diabetic.  Ruthvik has a strong support system with a partner of 30 years; neighbors and a host of golf friends.  He reports sleeping well and his appetite has improved recently since the surgery.  He denies a history of depression or anxiety or any current symptoms and is typically in a positive mood.  His primary stress is his health and the recovery of his leg that is not healing well from surgery.  He has seen a wound specialist and feels confident it is improving now.  Kobe has goals to return to playing golf 3x/week and improve his stamina and strength.  Staff will follow with Jeneen Rinks throughout the course of this program.      Expected Outcomes  Short:  Elvis will exercise consistently to increase his stamina and strength.  He will attend the educational components of this program in order to manage the stress in his life more positively.  Long:  Jedrek will get back in the routine of consistent exercise in order to return to the golf course 3x/week, upon Dr's approval.    Continue Psychosocial Services   Follow up required by staff       Psychosocial Re-Evaluation: Psychosocial Re-Evaluation    [place] Name 11/26/18 [phone]             Psychosocial Re-Evaluation   Current issues with  None Identified       Comments  Suraj is doing well mentally. He is enjoying coming to  class and doesn't even mind the time of day anymore. His partner has started to come to class with his and she is enjoying it too.  He sleeps well most nights, but does need tylenol on occassion for his leg pain to help him get to sleep.  He usually gets between 7-8 hours each night. He is really looking forward to being able to get back out on the golf course again next month!!       Expected Outcomes  Short:  Continue to attend class regularly.  Long: Return to golfing and practicing self care.        Interventions  Encouraged to attend Cardiac Rehabilitation for the exercise       Continue Psychosocial Services   Follow up required by staff          Psychosocial Discharge (Final Psychosocial Re-Evaluation): Psychosocial Re-Evaluation - 11/26/18 0846      Psychosocial Re-Evaluation   Current issues with  None Identified    Comments  Mats is doing well mentally. He is enjoying coming to class and doesn't even mind the time of day anymore. His partner has started to come to class with his and she is enjoying it too.  He sleeps well most nights, but does need tylenol on occassion for his leg pain to help him get to sleep.  He usually gets between 7-8 hours each night. He is really looking forward to being able to get back out on the golf course again next month!!    Expected Outcomes  Short: Continue to attend class regularly.  Long: Return to golfing and practicing self care.     Interventions  Encouraged to attend Cardiac Rehabilitation for the exercise    Continue Psychosocial Services   Follow up required by staff       Vocational Rehabilitation: Provide vocational rehab assistance to qualifying candidates.   Vocational Rehab Evaluation & Intervention: Vocational Rehab - 10/28/18 1526      Initial Vocational Rehab Evaluation & Intervention   Assessment shows need for Vocational Rehabilitation  No       Education: Education Goals: Education classes will be provided on a variety of  topics geared toward better understanding of heart health and risk factor modification. Participant will state understanding/return demonstration of topics presented as noted by education test scores.  Learning Barriers/Preferences: Learning Barriers/Preferences - 10/28/18 1525      Learning Barriers/Preferences   Learning Barriers  None    Learning Preferences  None       Education Topics:  AED/CPR: - Group verbal and written instruction with the use of models to demonstrate the basic use of the AED with the basic ABC's of resuscitation.   Cardiac Rehab from 12/13/2018 in [facility] Cardiac and Pulmonary Rehab  Date  12/13/18  Educator  SB  Instruction Review Code  1- Verbalizes Understanding      General Nutrition Guidelines/Fats and Fiber: -Group instruction provided by verbal, written material, models and posters to present the general guidelines for heart healthy nutrition. Gives an explanation and review of dietary fats and fiber.   Cardiac Rehab from 12/13/2018 in [facility] Cardiac and Pulmonary Rehab  Date  11/01/18  Educator  LB  Instruction Review Code  1- Verbalizes Understanding      Controlling Sodium/Reading Food Labels: -Group verbal and written material supporting the discussion of sodium use in heart healthy nutrition. Review and explanation with models, verbal and written materials for utilization of the food label.   Cardiac Rehab from 12/13/2018 in [facility] Cardiac and Pulmonary Rehab  Date  11/03/18  Educator  LB  Instruction Review Code  1- Verbalizes Understanding      Exercise Physiology & General Exercise Guidelines: - Group verbal and written instruction with models to review the exercise physiology of the cardiovascular system and associated critical values. Provides general exercise guidelines with specific guidelines to those with heart or lung disease.    Cardiac Rehab from 12/13/2018 in [facility] Cardiac and Pulmonary Rehab  Date  11/08/18  Educator  [place]   Instruction Review Code  1- Verbalizes Understanding      Aerobic Exercise & Resistance Training: - Gives group verbal and written instruction on the various components of exercise. Focuses on aerobic and resistive training programs and the benefits of this training and how to safely progress through these programs..   Cardiac Rehab from 12/13/2018 in [facility] Cardiac and Pulmonary Rehab  Date  11/10/18  Educator  [facility]  Instruction Review Code  1- Verbalizes Understanding      Flexibility, Balance, Mind/Body Relaxation: Provides group verbal/written instruction on the benefits of flexibility and balance training, including mind/body exercise modes such as yoga, pilates and tai chi.  Demonstration and skill practice provided.   Cardiac Rehab from 12/13/2018 in [facility] Cardiac and Pulmonary Rehab  Date  11/15/18  Educator  [facility]  Instruction Review Code  1- Verbalizes Understanding      Stress and Anxiety: - Provides group verbal and written instruction about the health risks of elevated stress and causes of high stress.  Discuss the correlation between heart/lung disease and anxiety and treatment options. Review healthy ways to manage with stress and anxiety.   Cardiac Rehab from 12/13/2018 in [facility] Cardiac and Pulmonary Rehab  Date  12/08/18  Educator  [facility]  Instruction Review Code  1- Verbalizes Understanding      Depression: - Provides group verbal and written instruction on the correlation between heart/lung disease and depressed mood, treatment options, and the stigmas associated with seeking treatment.   Anatomy & Physiology of the Heart: - Group verbal and written instruction and models provide basic cardiac anatomy and physiology, with the coronary electrical and arterial systems. Review of Valvular disease and Heart Failure   Cardiac Rehab from 12/13/2018 in [facility] Cardiac and Pulmonary Rehab  Date  11/22/18  Educator  SB  Instruction Review Code  1- Verbalizes Understanding       Cardiac Procedures: - Group verbal and written instruction to review commonly prescribed medications for heart disease. Reviews the medication, class of the drug, and side effects. Includes the steps to properly store meds and maintain the prescription regimen. (beta blockers and nitrates)   Cardiac Rehab from 12/13/2018 in [facility] Cardiac and Pulmonary Rehab  Date  12/06/18  Educator  SB  Instruction Review Code  1- Verbalizes Understanding      Cardiac Medications I: - Group verbal and written instruction to review commonly prescribed medications for heart disease. Reviews the medication, class of the drug, and side effects. Includes the steps to properly store meds and maintain the prescription regimen.   Cardiac Rehab from 12/13/2018 in [facility] Cardiac and Pulmonary Rehab  Date  11/29/18  Educator  SB  Instruction Review Code  1- Verbalizes Understanding      Cardiac Medications II: -Group verbal and written instruction to review commonly prescribed medications for heart disease. Reviews the medication, class of the drug, and side effects. (all other drug classes)   Cardiac Rehab from 12/13/2018 in [facility] Cardiac and Pulmonary Rehab  Date  11/17/18  Educator  [facility]  Instruction Review Code  1- Verbalizes Understanding       Go Sex-Intimacy & Heart Disease, Get SMART - Goal Setting: - Group verbal and written instruction through game format to discuss heart disease and the return to sexual intimacy. Provides group verbal and written material to discuss and apply goal setting through the application of the S.M.A.R.T. Method.   Cardiac Rehab from 12/13/2018 in [facility] Cardiac and Pulmonary Rehab  Date  12/06/18  Educator  SB  Instruction Review Code  1- Verbalizes Understanding      Other Matters of the Heart: - Provides group verbal, written materials and models to describe Stable Angina and Peripheral Artery. Includes description of the disease process and treatment options available to  the cardiac patient.   Cardiac Rehab from 12/13/2018 in [facility] Cardiac and Pulmonary Rehab  Date  11/22/18  Educator  SB  Instruction Review Code  1- Verbalizes Understanding      Exercise & Equipment Safety: - Individual verbal instruction and demonstration of equipment use and safety with use of the equipment.   Cardiac Rehab from 12/13/2018 in [facility] Cardiac and Pulmonary Rehab  Date  10/28/18  Educator  [facility]  Instruction Review Code  1- Verbalizes Understanding      Infection Prevention: - Provides verbal and written material to individual with discussion of infection control including proper hand washing and proper equipment cleaning during exercise session.   Cardiac Rehab from 12/13/2018 in [facility] Cardiac and Pulmonary Rehab  Date  10/28/18  Educator  [facility]  Instruction Review Code  1- Verbalizes Understanding      Falls Prevention: - Provides verbal and written material to individual with discussion of falls prevention and safety.   Cardiac Rehab from 12/13/2018 in [facility] Cardiac and Pulmonary Rehab  Date  10/28/18  Educator  [facility]  Instruction Review Code  1- Verbalizes Understanding      Diabetes: - Individual verbal and written instruction to review signs/symptoms of diabetes, desired ranges of glucose level fasting, after meals and with exercise. Acknowledge that pre and post exercise glucose checks will be done for 3 sessions at entry of program.   Cardiac Rehab from 12/13/2018 in [facility] Cardiac and Pulmonary Rehab  Date  10/28/18  Educator  [facility]  Instruction Review Code  1- Verbalizes Understanding      Know Your Numbers and Risk Factors: -Group verbal and written instruction about important numbers in your health.  Discussion of what are risk factors and how they play a role in the disease process.  Review of Cholesterol, Blood Pressure, Diabetes, and BMI and the role they play in your overall health.   Cardiac Rehab from 12/13/2018 in [facility] Cardiac and Pulmonary Rehab  Date   11/17/18  Educator  [facility]  Instruction Review Code  1- Verbalizes Understanding      Sleep Hygiene: -Provides group verbal and written instruction about how sleep can affect your health.  Define sleep hygiene, discuss sleep cycles and impact of sleep habits. Review good sleep hygiene tips.    Other: -Provides group and verbal instruction on various topics (see comments)   Knowledge Questionnaire Score: Knowledge Questionnaire Score - 10/28/18 1526      Knowledge Questionnaire Score   Pre Score  26/26       Core Components/Risk Factors/Patient Goals at Admission: Personal Goals and Risk Factors at Admission - 10/28/18 1523      Core Components/Risk Factors/Patient Goals on Admission    Weight Management  Yes;Weight Maintenance    Intervention  Weight Management: Develop a combined nutrition and exercise program designed to reach desired caloric intake, while maintaining appropriate intake of nutrient and fiber, sodium and fats, and appropriate energy expenditure required for the weight goal.;Weight Management: Provide education and appropriate resources to help participant work on and attain dietary goals.    Admit Weight  165 lb (74.8 kg)    Expected Outcomes  Short Term: Continue to assess and modify  interventions until short term weight is achieved;Long Term: Adherence to nutrition and physical activity/exercise program aimed toward attainment of established weight goal;Weight Maintenance: Understanding of the daily nutrition guidelines, which includes 25-35% calories from fat, 7% or less cal from saturated fats, less than 230m cholesterol, less than 1.5gm of sodium, & 5 or more servings of fruits and vegetables daily;Understanding recommendations for meals to include 15-35% energy as protein, 25-35% energy from fat, 35-60% energy from carbohydrates, less than 2069mof dietary cholesterol, 20-35 gm of total fiber daily;Understanding of distribution of calorie intake throughout the day  with the consumption of 4-5 meals/snacks    Diabetes  Yes    Intervention  Provide education about signs/symptoms and action to take for hypo/hyperglycemia.;Provide education about proper nutrition, including hydration, and aerobic/resistive exercise prescription along with prescribed medications to achieve blood glucose in normal ranges: Fasting glucose 65-99 mg/dL    Expected Outcomes  Long Term: Attainment of HbA1C < 7%.    Hypertension  Yes    Intervention  Provide education on lifestyle modifcations including regular physical activity/exercise, weight management, moderate sodium restriction and increased consumption of fresh fruit, vegetables, and low fat dairy, alcohol moderation, and smoking cessation.;Monitor prescription use compliance.    Expected Outcomes  Short Term: Continued assessment and intervention until BP is < 140/9059mG in hypertensive participants. < 130/36m46m in hypertensive participants with diabetes, heart failure or chronic kidney disease.;Long Term: Maintenance of blood pressure at goal levels.    Lipids  Yes    Intervention  Provide education and support for participant on nutrition & aerobic/resistive exercise along with prescribed medications to achieve LDL <70mg28mL >40mg.20mExpected Outcomes  Short Term: Participant states understanding of desired cholesterol values and is compliant with medications prescribed. Participant is following exercise prescription and nutrition guidelines.;Long Term: Cholesterol controlled with medications as prescribed, with individualized exercise RX and with personalized nutrition plan. Value goals: LDL < 70mg, 51m> 40 mg.       Core Components/Risk Factors/Patient Goals Review:  Goals and Risk Factor Review    Row Name 11/26/18 0831             Core Components/Risk Factors/Patient Goals Review   Personal Goals Review  Weight Management/Obesity;Diabetes;Hypertension;Lipids       Review  [place] iAdrinng well in rehab. He lost  15lbs coming out of hospital.  He has been staying steady around 165lbs.  This also helps him control his blood sugars better.  He checks them in the mornings and his last A1c was under six.  He is off of his insulin now.  He also checks his blood pressures in the morning too.  He is doing well with his medications overall.   He is still having some incisional soreness in chest.  He finanly was able to get a clip out of his leg wound but that is now healing.         Expected Outcomes  Short: Continue to monitor sugars.  Long: Continue to maintain weight.           Core Components/Risk Factors/Patient Goals at Discharge (Final Review):  Goals and Risk Factor Review - 11/26/18 0831      Core Components/Risk Factors/Patient Goals Review   Personal Goals Review  Weight Management/Obesity;Diabetes;Hypertension;Lipids    Review  [place] iWestleyng well in rehab. He lost 15lbs coming out of hospital.  He has been staying steady around 165lbs.  This also helps him control his blood sugars  better.  He checks them in the mornings and his last A1c was under six.  He is off of his insulin now.  He also checks his blood pressures in the morning too.  He is doing well with his medications overall.   He is still having some incisional soreness in chest.  He finanly was able to get a clip out of his leg wound but that is now healing.      Expected Outcomes  Short: Continue to monitor sugars.  Long: Continue to maintain weight.        ITP Comments: ITP Comments    Row Name 10/28/18 1507 11/17/18 0915 12/15/18 0555       ITP Comments  Med Review completed. Initial ITP created. Diagnosis can be found in Care Everywhere 11/12  30 Day Review. Continue with ITP unless directed changes per Medical Director review.  30 Day Review. Continue with ITP unless directed changes per Medical Director review.        Comments:

## 2018-12-15 NOTE — Patient Instructions (Signed)
Discharge Patient Instructions  Patient Details  Name: Rickey Sherman MRN: [id number] Date of Birth: [birth year] Referring Provider:  Yolonda Kida, MD   Number of Visits: 55  Reason for Discharge:  Patient reached a stable level of exercise. Patient independent in their exercise. Patient has met program and personal goals.  Smoking History:  Social History   Tobacco Use  Smoking Status Never Smoker  Smokeless Tobacco Never Used    Diagnosis:  S/P CABG x 4  Initial Exercise Prescription: Initial Exercise Prescription - 10/28/18 1500      Date of Initial Exercise RX and Referring Provider   Date  10/28/18    Referring Provider  Edwina Barth      Treadmill   MPH  2.8    Grade  1.5    Minutes  15    METs  3.7      Recumbant Bike   Level  4    RPM  60    Watts  46    Minutes  15    METs  3.7      REL-XR   Level  3    Speed  50    Minutes  15    METs  3.7      Prescription Details   Frequency (times per week)  3    Duration  Progress to 45 minutes of aerobic exercise without signs/symptoms of physical distress      Intensity   THRR 40-80% of Max Heartrate  97-132    Ratings of Perceived Exertion  11-13    Perceived Dyspnea  0-4      Resistance Training   Training Prescription  Yes    Weight  3 lb    Reps  10-15       Discharge Exercise Prescription (Final Exercise Prescription Changes): Exercise Prescription Changes - 12/08/18 1700      Response to Exercise   Blood Pressure (Admit)  132/74    Blood Pressure (Exercise)  128/70    Blood Pressure (Exit)  108/50    Heart Rate (Admit)  63 bpm    Heart Rate (Exercise)  132 bpm    Heart Rate (Exit)  89 bpm    Rating of Perceived Exertion (Exercise)  14    Symptoms  none    Duration  Continue with 45 min of aerobic exercise without signs/symptoms of physical distress.    Intensity  THRR unchanged      Progression   Progression  Continue to progress workloads to maintain intensity without  signs/symptoms of physical distress.    Average METs  7.59      Resistance Training   Training Prescription  Yes    Weight  5 lbs    Reps  10-15      Interval Training   Interval Training  No      Treadmill   MPH  3    Grade  8    Minutes  15    METs  6.6      Recumbant Bike   Level  6    Watts  57    Minutes  15    METs  4.46      REL-XR   Level  8    Minutes  15    METs  11.7      Home Exercise Plan   Plans to continue exercise at  Home (comment)   walking and weights   Frequency  Add 2 additional days  to program exercise sessions.    Initial Home Exercises Provided  11/10/18       Functional Capacity: 6 Minute Walk    Row Name 10/28/18 1502 12/15/18 0910       6 Minute Walk   Phase  Initial  Discharge    Distance  1520 feet  2068 feet    Distance % Change  -  36.1 %    Distance Feet Change  -  548 ft    Walk Time  6 minutes  6 minutes    # of Rest Breaks  0  0    MPH  2.88  3.92    METS  3.93  5    RPE  12  12    Perceived Dyspnea   0  0    VO2 Peak  13.8  17.51    Symptoms  No  No    Resting HR  63 bpm  80 bpm    Resting BP  110/58  134/70    Resting Oxygen Saturation   99 %  -    Exercise Oxygen Saturation  during 6 min walk  99 %  94 %    Max Ex. HR  117 bpm  127 bpm    Max Ex. BP  156/58  152/74    2 Minute Post BP  126/58  -       Quality of Life: Quality of Life - 12/15/18 0757      Quality of Life   Select  Quality of Life      Quality of Life Scores   Health/Function Pre  26.8 %    Health/Function Post  29.03 %    Health/Function % Change  8.32 %    Socioeconomic Pre  28.63 %    Socioeconomic Post  28 %    Socioeconomic % Change   -2.2 %    Psych/Spiritual Pre  26.57 %    Psych/Spiritual Post  28.93 %    Psych/Spiritual % Change  8.88 %    Family Pre  28.8 %    Family Post  26.3 %    Family % Change  -8.68 %    GLOBAL Pre  27.46 %    GLOBAL Post  27.7 %    GLOBAL % Change  0.87 %       Personal Goals: Goals  established at orientation with interventions provided to work toward goal. Personal Goals and Risk Factors at Admission - 10/28/18 1523      Core Components/Risk Factors/Patient Goals on Admission    Weight Management  Yes;Weight Maintenance    Intervention  Weight Management: Develop a combined nutrition and exercise program designed to reach desired caloric intake, while maintaining appropriate intake of nutrient and fiber, sodium and fats, and appropriate energy expenditure required for the weight goal.;Weight Management: Provide education and appropriate resources to help participant work on and attain dietary goals.    Admit Weight  165 lb (74.8 kg)    Expected Outcomes  Short Term: Continue to assess and modify interventions until short term weight is achieved;Long Term: Adherence to nutrition and physical activity/exercise program aimed toward attainment of established weight goal;Weight Maintenance: Understanding of the daily nutrition guidelines, which includes 25-35% calories from fat, 7% or less cal from saturated fats, less than 240m cholesterol, less than 1.5gm of sodium, & 5 or more servings of fruits and vegetables daily;Understanding recommendations for meals to include 15-35% energy as protein, 25-35% energy from  fat, 35-60% energy from carbohydrates, less than 263m of dietary cholesterol, 20-35 gm of total fiber daily;Understanding of distribution of calorie intake throughout the day with the consumption of 4-5 meals/snacks    Diabetes  Yes    Intervention  Provide education about signs/symptoms and action to take for hypo/hyperglycemia.;Provide education about proper nutrition, including hydration, and aerobic/resistive exercise prescription along with prescribed medications to achieve blood glucose in normal ranges: Fasting glucose 65-99 mg/dL    Expected Outcomes  Long Term: Attainment of HbA1C < 7%.    Hypertension  Yes    Intervention  Provide education on lifestyle modifcations  including regular physical activity/exercise, weight management, moderate sodium restriction and increased consumption of fresh fruit, vegetables, and low fat dairy, alcohol moderation, and smoking cessation.;Monitor prescription use compliance.    Expected Outcomes  Short Term: Continued assessment and intervention until BP is < 140/919mHG in hypertensive participants. < 130/8032mG in hypertensive participants with diabetes, heart failure or chronic kidney disease.;Long Term: Maintenance of blood pressure at goal levels.    Lipids  Yes    Intervention  Provide education and support for participant on nutrition & aerobic/resistive exercise along with prescribed medications to achieve LDL <74m80mDL >40mg42m Expected Outcomes  Short Term: Participant states understanding of desired cholesterol values and is compliant with medications prescribed. Participant is following exercise prescription and nutrition guidelines.;Long Term: Cholesterol controlled with medications as prescribed, with individualized exercise RX and with personalized nutrition plan. Value goals: LDL < 74mg,69m > 40 mg.        Personal Goals Discharge: Goals and Risk Factor Review - 11/26/18 0831      Core Components/Risk Factors/Patient Goals Review   Personal Goals Review  Weight Management/Obesity;Diabetes;Hypertension;Lipids    Review  Rickey Sherman well in rehab. He lost 15lbs coming out of hospital.  He has been staying steady around 165lbs.  This also helps him control his blood sugars better.  He checks them in the mornings and his last A1c was under six.  He is off of his insulin now.  He also checks his blood pressures in the morning too.  He is doing well with his medications overall.   He is still having some incisional soreness in chest.  He finanly was able to get a clip out of his leg wound but that is now healing.      Expected Outcomes  Short: Continue to monitor sugars.  Long: Continue to maintain weight.         Exercise Goals and Review: Exercise Goals    Row Name 10/28/18 1501             Exercise Goals   Increase Physical Activity  Yes       Intervention  Provide advice, education, support and counseling about physical activity/exercise needs.;Develop an individualized exercise prescription for aerobic and resistive training based on initial evaluation findings, risk stratification, comorbidities and participant's personal goals.       Expected Outcomes  Short Term: Attend rehab on a regular basis to increase amount of physical activity.;Long Term: Add in home exercise to make exercise part of routine and to increase amount of physical activity.;Long Term: Exercising regularly at least 3-5 days a week.       Increase Strength and Stamina  Yes       Intervention  Provide advice, education, support and counseling about physical activity/exercise needs.;Develop an individualized exercise prescription for aerobic and resistive training based on initial  evaluation findings, risk stratification, comorbidities and participant's personal goals.       Expected Outcomes  Short Term: Increase workloads from initial exercise prescription for resistance, speed, and METs.;Short Term: Perform resistance training exercises routinely during rehab and add in resistance training at home;Long Term: Improve cardiorespiratory fitness, muscular endurance and strength as measured by increased METs and functional capacity (6MWT)       Able to understand and use rate of perceived exertion (RPE) scale  Yes       Intervention  Provide education and explanation on how to use RPE scale       Expected Outcomes  Short Term: Able to use RPE daily in rehab to express subjective intensity level;Long Term:  Able to use RPE to guide intensity level when exercising independently       Knowledge and understanding of Target Heart Rate Range (THRR)  Yes       Intervention  Provide education and explanation of THRR including how the  numbers were predicted and where they are located for reference       Expected Outcomes  Short Term: Able to state/look up THRR;Short Term: Able to use daily as guideline for intensity in rehab;Long Term: Able to use THRR to govern intensity when exercising independently       Able to check pulse independently  Yes       Intervention  Provide education and demonstration on how to check pulse in carotid and radial arteries.;Review the importance of being able to check your own pulse for safety during independent exercise       Expected Outcomes  Short Term: Able to explain why pulse checking is important during independent exercise;Long Term: Able to check pulse independently and accurately       Understanding of Exercise Prescription  Yes       Intervention  Provide education, explanation, and written materials on patient's individual exercise prescription       Expected Outcomes  Short Term: Able to explain program exercise prescription;Long Term: Able to explain home exercise prescription to exercise independently          Exercise Goals Re-Evaluation: Exercise Goals Re-Evaluation    Row Name 11/01/18 0849 11/10/18 0908 11/23/18 0925 11/26/18 0828 12/08/18 1704     Exercise Goal Re-Evaluation   Exercise Goals Review  Increase Physical Activity;Increase Strength and Stamina;Able to understand and use rate of perceived exertion (RPE) scale;Knowledge and understanding of Target Heart Rate Range (THRR);Understanding of Exercise Prescription  Increase Physical Activity;Increase Strength and Stamina;Able to understand and use rate of perceived exertion (RPE) scale;Knowledge and understanding of Target Heart Rate Range (THRR);Able to check pulse independently;Understanding of Exercise Prescription  Increase Physical Activity;Increase Strength and Stamina;Understanding of Exercise Prescription  Increase Physical Activity;Increase Strength and Stamina;Understanding of Exercise Prescription  Increase  Physical Activity;Increase Strength and Stamina;Understanding of Exercise Prescription   Comments  Reviewed RPE scale, THR and program prescription with pt today.  Pt voiced understanding and was given a copy of goals to take home.   Reviewed home exercise with pt today.  Pt plans to walk and use weights at home for exercise.  Reviewed THR, pulse, RPE, sign and symptoms, and when to call 911 or MD.  Also discussed weather considerations and indoor options.  Pt voiced understanding.  Rickey Sherman is doing well in rehab.  He is now up level 6 on the recumbent bike and using 7 lbs hand weights.  We will continue to monitor his progress.   Rickey Sherman  is doing well in rehab.  He is getting in 43mn a day on the [employer] bike at home.  He plans to start walking once the weather gets better.  He has also been nursing a leg wound that has been holding him.  We talked about adding in another round on the [employer] to get his total 30 min.  He is also looking forwad to playing golf again and he practices for two hours a day on his non playing days.   He is getting his strength and stamina back slowly but surely.   JHurshelcontinues to do well in rehab.  He has now started doing intervals and has found them challenging.  We will continue to encourage his intervals and continue to monitor his progress.    Expected Outcomes  Short: Use RPE daily to regulate intensity. Long: Follow program prescription in THR.  Short: Start to add in more walking at home.  Long: Continue to add in exercise at home.  Short: Talk about adding in intervals.  Long: Continue to increase strength and stamina.   Short: Add in another round on the bike at home. Long: Continue to increase strength and stamina.   Short: Continue with intervals.  Long: Continue to increase strength and stamina.       Nutrition & Weight - Outcomes: Pre Biometrics - 10/28/18 1500      Pre Biometrics   Height  5' 10.5" (1.791 m)    Weight  165 lb (74.8 kg)    Waist Circumference   37 inches    Hip Circumference  38.5 inches    Waist to Hip Ratio  0.96 %    BMI (Calculated)  23.33    Single Leg Stand  25 seconds      Post Biometrics - 12/15/18 0911       Post  Biometrics   Height  5' 10.5" (1.791 m)    Weight  161 lb (73 kg)    Waist Circumference  35 inches    Hip Circumference  37 inches    Waist to Hip Ratio  0.95 %    BMI (Calculated)  22.77    Single Leg Stand  30 seconds       Nutrition: Nutrition Therapy & Goals - 10/28/18 1529      Intervention Plan   Intervention  Prescribe, educate and counsel regarding individualized specific dietary modifications aiming towards targeted core components such as weight, hypertension, lipid management, diabetes, heart failure and other comorbidities.;Nutrition handout(s) given to patient.    Expected Outcomes  Short Term Goal: Understand basic principles of dietary content, such as calories, fat, sodium, cholesterol and nutrients.;Short Term Goal: A plan has been developed with personal nutrition goals set during dietitian appointment.;Long Term Goal: Adherence to prescribed nutrition plan.       Nutrition Discharge: Nutrition Assessments - 12/15/18 0756      MEDFICTS Scores   Pre Score  60    Post Score  40    Score Difference  -20       Education Questionnaire Score: Knowledge Questionnaire Score - 12/15/18 0757      Knowledge Questionnaire Score   Pre Score  26/26    Post Score  26/26       Goals reviewed with patient; copy given to patient.

## 2018-12-15 NOTE — Progress Notes (Signed)
Daily Session Note  Patient Details  Name: Rickey Sherman MRN: [id number] Date of Birth: 27-Apr-1946 Referring Provider:     Cardiac Rehab from 10/28/2018 in [facility] [place] [facility]  Referring Provider  Edwina Barth      Encounter Date: 12/15/2018  Check In: Session Check In - 12/15/18 0746      Check-In   Supervising physician immediately available to respond to emergencies  See telemetry face sheet for immediately available ER MD    Location  [facility]-Cardiac & Pulmonary Rehab    Staff Present  Justin Mend Lorre Nick, MA, RCEP, CCRP, Exercise Physiologist;Susanne Bice, RN, BSN, CCRP    Medication changes reported      No    Fall or balance concerns reported     No    Warm-up and Cool-down  Performed as group-led instruction    Resistance Training Performed  Yes    VAD Patient?  No    PAD/SET Patient?  No      Pain Assessment   Currently in Pain?  No/denies          Social History   Tobacco Use  Smoking Status Never Smoker  Smokeless Tobacco Never Used    Goals Met:  Independence with exercise equipment Exercise tolerated well No report of cardiac concerns or symptoms Strength training completed today  Goals Unmet:  Not Applicable  Comments: Pt able to follow exercise prescription today without complaint.  Will continue to monitor for progression. Momence Name 10/28/18 1502 12/15/18 0910       6 Minute Walk   Phase  Initial  Discharge    Distance  1520 feet  2068 feet    Distance % Change  -  36.1 %    Distance Feet Change  -  548 ft    Walk Time  6 minutes  6 minutes    # of Rest Breaks  0  0    MPH  2.88  3.92    METS  3.93  5    RPE  12  12    Perceived Dyspnea   0  0    VO2 Peak  13.8  17.51    Symptoms  No  No    Resting HR  63 bpm  80 bpm    Resting BP  110/58  134/70    Resting Oxygen Saturation   99 %  -    Exercise Oxygen Saturation  during 6 min walk  99 %  94 %    Max Ex. HR  117 bpm  127 bpm    Max Ex. BP   156/58  152/74    2 Minute Post BP  126/58  -         Dr. Emily Filbert is Medical Director for [facility] and [facility].

## 2018-12-17 ENCOUNTER — Encounter: Payer: Medicare Other | Admitting: *Deleted

## 2018-12-17 DIAGNOSIS — E11621 Type 2 diabetes mellitus with foot ulcer: Secondary | ICD-10-CM | POA: Diagnosis not present

## 2018-12-17 DIAGNOSIS — Z951 Presence of aortocoronary bypass graft: Secondary | ICD-10-CM

## 2018-12-17 NOTE — Progress Notes (Signed)
Daily Session Note  Patient Details  Name: Rickey Sherman MRN: [id number] Date of Birth: Jul 16, 1946 Referring Provider:     Cardiac Rehab from 10/28/2018 in [facility]  Referring Provider  Rickey Sherman      Encounter Date: 12/17/2018  Check In: Session Check In - 12/17/18 0940      Check-In   Supervising physician immediately available to respond to emergencies  See telemetry face sheet for immediately available ER MD    Location  [facility] Pul[facility] Rehab    Staff Present  Alberteen Sam, MA, RCEP, CCRP, Exercise Physiologist;Meredith Sherryll Burger, RN Geralyn Corwin, RN BSN    Medication changes reported      No    Fall or balance concerns reported     No    Warm-up and Cool-down  Performed as group-led Higher education careers adviser Performed  Yes    VAD Patient?  No    PAD/SET Patient?  No      Pain Assessment   Currently in Pain?  No/denies          Social History   Tobacco Use  Smoking Status Never Smoker  Smokeless Tobacco Never Used    Goals Met:  Independence with exercise equipment Exercise tolerated well Personal goals reviewed No report of cardiac concerns or symptoms Strength training completed today  Goals Unmet:  Not Applicable  Comments:  Rickey Sherman graduated today from  rehab with 35 sessions completed.  Details of the patient's exercise prescription and what He needs to do in order to continue the prescription and progress were discussed with patient.  Patient was given a copy of prescription and goals.  Patient verbalized understanding.  Rickey Sherman plans to continue to exercise by walking and using [employer] bike at home.    Dr. Emily Filbert is Medical Director for [place] [facility].

## 2018-12-17 NOTE — Progress Notes (Signed)
Discharge Progress Report  Patient Details  Name: Rickey Sherman MRN: [id number] Date of Birth: [birth year] Referring Provider:     Cardiac Rehab from 10/28/2018 in [facility]  Referring Provider  Edwina Barth       Number of Visits: 35  Reason for Discharge:  Patient reached a stable level of exercise. Patient independent in their exercise. Patient has met program and personal goals.  Smoking History:  Social History   Tobacco Use  Smoking Status Never Smoker  Smokeless Tobacco Never Used    Diagnosis:  S/P CABG x 4  ADL [facility]:   Initial Exercise Prescription: Initial Exercise Prescription - 10/28/18 1500      Date of Initial Exercise RX and Referring Provider   Date  10/28/18    Referring Provider  Edwina Barth      Treadmill   MPH  2.8    Grade  1.5    Minutes  15    METs  3.7      Recumbant Bike   Level  4    RPM  60    Watts  46    Minutes  15    METs  3.7      REL-XR   Level  3    Speed  50    Minutes  15    METs  3.7      Prescription Details   Frequency (times per week)  3    Duration  Progress to 45 minutes of aerobic exercise without signs/symptoms of physical distress      Intensity   THRR 40-80% of Max Heartrate  97-132    Ratings of Perceived Exertion  11-13    Perceived Dyspnea  0-4      Resistance Training   Training Prescription  Yes    Weight  3 lb    Reps  10-15       Discharge Exercise Prescription (Final Exercise Prescription Changes): Exercise Prescription Changes - 12/08/18 1700      Response to Exercise   Blood Pressure (Admit)  132/74    Blood Pressure (Exercise)  128/70    Blood Pressure (Exit)  108/50    Heart Rate (Admit)  63 bpm    Heart Rate (Exercise)  132 bpm    Heart Rate (Exit)  89 bpm    Rating of Perceived Exertion (Exercise)  14    Symptoms  none    Duration  Continue with 45 min of aerobic exercise without signs/symptoms of physical distress.    Intensity  THRR unchanged       Progression   Progression  Continue to progress workloads to maintain intensity without signs/symptoms of physical distress.    Average METs  7.59      Resistance Training   Training Prescription  Yes    Weight  5 lbs    Reps  10-15      Interval Training   Interval Training  No      Treadmill   MPH  3    Grade  8    Minutes  15    METs  6.6      Recumbant Bike   Level  6    Watts  57    Minutes  15    METs  4.46      REL-XR   Level  8    Minutes  15    METs  11.7      Home Exercise Plan  Plans to continue exercise at  Home (comment)   walking and weights   Frequency  Add 2 additional days to program exercise sessions.    Initial Home Exercises Provided  11/10/18       Functional Capacity: 6 Minute Walk    Row Name 10/28/18 1502 12/15/18 0910       6 Minute Walk   Phase  Initial  Discharge    Distance  1520 feet  2068 feet    Distance % Change  -  36.1 %    Distance Feet Change  -  548 ft    Walk Time  6 minutes  6 minutes    # of Rest Breaks  0  0    MPH  2.88  3.92    METS  3.93  5    RPE  12  12    Perceived Dyspnea   0  0    VO2 Peak  13.8  17.51    Symptoms  No  No    Resting HR  63 bpm  80 bpm    Resting BP  110/58  134/70    Resting Oxygen Saturation   99 %  -    Exercise Oxygen Saturation  during 6 min walk  99 %  94 %    Max Ex. HR  117 bpm  127 bpm    Max Ex. BP  156/58  152/74    2 Minute Post BP  126/58  -       Psychological, QOL, Others - Outcomes: PHQ 2/9: Depression screen [facility] 2/9 12/15/2018 10/28/2018  Decreased Interest 0 0  Down, Depressed, Hopeless 0 0  PHQ - 2 Score 0 0  Altered sleeping 0 1  Tired, decreased energy 0 0  Change in appetite 0 0  Feeling bad or failure about yourself  0 1  Trouble concentrating 0 0  Moving slowly or fidgety/restless 0 0  Suicidal thoughts 0 0  PHQ-9 Score 0 2  Difficult doing work/chores Not difficult at all Not difficult at all    Quality of Life: Quality of Life - 12/15/18 0757       Quality of Life   Select  Quality of Life      Quality of Life Scores   Health/Function Pre  26.8 %    Health/Function Post  29.03 %    Health/Function % Change  8.32 %    Socioeconomic Pre  28.63 %    Socioeconomic Post  28 %    Socioeconomic % Change   -2.2 %    Psych/Spiritual Pre  26.57 %    Psych/Spiritual Post  28.93 %    Psych/Spiritual % Change  8.88 %    Family Pre  28.8 %    Family Post  26.3 %    Family % Change  -8.68 %    GLOBAL Pre  27.46 %    GLOBAL Post  27.7 %    GLOBAL % Change  0.87 %       Personal Goals: Goals established at orientation with interventions provided to work toward goal. Personal Goals and Risk Factors at Admission - 10/28/18 1523      Core Components/Risk Factors/Patient Goals on Admission    Weight Management  Yes;Weight Maintenance    Intervention  Weight Management: Develop a combined nutrition and exercise program designed to reach desired caloric intake, while maintaining appropriate intake of nutrient and fiber, sodium and fats, and appropriate energy expenditure  required for the weight goal.;Weight Management: Provide education and appropriate resources to help participant work on and attain dietary goals.    Admit Weight  165 lb (74.8 kg)    Expected Outcomes  Short Term: Continue to assess and modify interventions until short term weight is achieved;Long Term: Adherence to nutrition and physical activity/exercise program aimed toward attainment of established weight goal;Weight Maintenance: Understanding of the daily nutrition guidelines, which includes 25-35% calories from fat, 7% or less cal from saturated fats, less than 264m cholesterol, less than 1.5gm of sodium, & 5 or more servings of fruits and vegetables daily;Understanding recommendations for meals to include 15-35% energy as protein, 25-35% energy from fat, 35-60% energy from carbohydrates, less than 2039mof dietary cholesterol, 20-35 gm of total fiber daily;Understanding of  distribution of calorie intake throughout the day with the consumption of 4-5 meals/snacks    Diabetes  Yes    Intervention  Provide education about signs/symptoms and action to take for hypo/hyperglycemia.;Provide education about proper nutrition, including hydration, and aerobic/resistive exercise prescription along with prescribed medications to achieve blood glucose in normal ranges: Fasting glucose 65-99 mg/dL    Expected Outcomes  Long Term: Attainment of HbA1C < 7%.    Hypertension  Yes    Intervention  Provide education on lifestyle modifcations including regular physical activity/exercise, weight management, moderate sodium restriction and increased consumption of fresh fruit, vegetables, and low fat dairy, alcohol moderation, and smoking cessation.;Monitor prescription use compliance.    Expected Outcomes  Short Term: Continued assessment and intervention until BP is < 140/9010mG in hypertensive participants. < 130/66m25m in hypertensive participants with diabetes, heart failure or chronic kidney disease.;Long Term: Maintenance of blood pressure at goal levels.    Lipids  Yes    Intervention  Provide education and support for participant on nutrition & aerobic/resistive exercise along with prescribed medications to achieve LDL <70mg56mL >40mg.51mExpected Outcomes  Short Term: Participant states understanding of desired cholesterol values and is compliant with medications prescribed. Participant is following exercise prescription and nutrition guidelines.;Long Term: Cholesterol controlled with medications as prescribed, with individualized exercise RX and with personalized nutrition plan. Value goals: LDL < 70mg, 6m> 40 mg.        Personal Goals Discharge: Goals and Risk Factor Review    Row Name 11/26/18 0831             Core Components/Risk Factors/Patient Goals Review   Personal Goals Review  Weight Management/Obesity;Diabetes;Hypertension;Lipids       Review  Demarkus iDartanionng  well in rehab. He lost 15lbs coming out of hospital.  He has been staying steady around 165lbs.  This also helps him control his blood sugars better.  He checks them in the mornings and his last A1c was under six.  He is off of his insulin now.  He also checks his blood pressures in the morning too.  He is doing well with his medications overall.   He is still having some incisional soreness in chest.  He finanly was able to get a clip out of his leg wound but that is now healing.         Expected Outcomes  Short: Continue to monitor sugars.  Long: Continue to maintain weight.           Exercise Goals and Review: Exercise Goals    Row Name 10/28/18 1501             Exercise Goals  Increase Physical Activity  Yes       Intervention  Provide advice, education, support and counseling about physical activity/exercise needs.;Develop an individualized exercise prescription for aerobic and resistive training based on initial evaluation findings, risk stratification, comorbidities and participant's personal goals.       Expected Outcomes  Short Term: Attend rehab on a regular basis to increase amount of physical activity.;Long Term: Add in home exercise to make exercise part of routine and to increase amount of physical activity.;Long Term: Exercising regularly at least 3-5 days a week.       Increase Strength and Stamina  Yes       Intervention  Provide advice, education, support and counseling about physical activity/exercise needs.;Develop an individualized exercise prescription for aerobic and resistive training based on initial evaluation findings, risk stratification, comorbidities and participant's personal goals.       Expected Outcomes  Short Term: Increase workloads from initial exercise prescription for resistance, speed, and METs.;Short Term: Perform resistance training exercises routinely during rehab and add in resistance training at home;Long Term: Improve cardiorespiratory fitness,  muscular endurance and strength as measured by increased METs and functional capacity (6MWT)       Able to understand and use rate of perceived exertion (RPE) scale  Yes       Intervention  Provide education and explanation on how to use RPE scale       Expected Outcomes  Short Term: Able to use RPE daily in rehab to express subjective intensity level;Long Term:  Able to use RPE to guide intensity level when exercising independently       Knowledge and understanding of Target Heart Rate Range (THRR)  Yes       Intervention  Provide education and explanation of THRR including how the numbers were predicted and where they are located for reference       Expected Outcomes  Short Term: Able to state/look up THRR;Short Term: Able to use daily as guideline for intensity in rehab;Long Term: Able to use THRR to govern intensity when exercising independently       Able to check pulse independently  Yes       Intervention  Provide education and demonstration on how to check pulse in carotid and radial arteries.;Review the importance of being able to check your own pulse for safety during independent exercise       Expected Outcomes  Short Term: Able to explain why pulse checking is important during independent exercise;Long Term: Able to check pulse independently and accurately       Understanding of Exercise Prescription  Yes       Intervention  Provide education, explanation, and written materials on patient's individual exercise prescription       Expected Outcomes  Short Term: Able to explain program exercise prescription;Long Term: Able to explain home exercise prescription to exercise independently          Exercise Goals Re-Evaluation: Exercise Goals Re-Evaluation    Row Name 11/01/18 0849 11/10/18 0908 11/23/18 0925 11/26/18 0828 12/08/18 1704     Exercise Goal Re-Evaluation   Exercise Goals Review  Increase Physical Activity;Increase Strength and Stamina;Able to understand and use rate of  perceived exertion (RPE) scale;Knowledge and understanding of Target Heart Rate Range (THRR);Understanding of Exercise Prescription  Increase Physical Activity;Increase Strength and Stamina;Able to understand and use rate of perceived exertion (RPE) scale;Knowledge and understanding of Target Heart Rate Range (THRR);Able to check pulse independently;Understanding of Exercise Prescription  Increase Physical Activity;Increase  Strength and Stamina;Understanding of Exercise Prescription  Increase Physical Activity;Increase Strength and Stamina;Understanding of Exercise Prescription  Increase Physical Activity;Increase Strength and Stamina;Understanding of Exercise Prescription   Comments  Reviewed RPE scale, THR and program prescription with pt today.  Pt voiced understanding and was given a copy of goals to take home.   Reviewed home exercise with pt today.  Pt plans to walk and use weights at home for exercise.  Reviewed THR, pulse, RPE, sign and symptoms, and when to call 911 or MD.  Also discussed weather considerations and indoor options.  Pt voiced understanding.  Hykeem is doing well in rehab.  He is now up level 6 on the recumbent bike and using 7 lbs hand weights.  We will continue to monitor his progress.   Brayan is doing well in rehab.  He is getting in 57mn a day on the [employer] bike at home.  He plans to start walking once the weather gets better.  He has also been nursing a leg wound that has been holding him.  We talked about adding in another round on the [employer] to get his total 30 min.  He is also looking forwad to playing golf again and he practices for two hours a day on his non playing days.   He is getting his strength and stamina back slowly but surely.   JTorricontinues to do well in rehab.  He has now started doing intervals and has found them challenging.  We will continue to encourage his intervals and continue to monitor his progress.    Expected Outcomes  Short: Use RPE daily to regulate  intensity. Long: Follow program prescription in THR.  Short: Start to add in more walking at home.  Long: Continue to add in exercise at home.  Short: Talk about adding in intervals.  Long: Continue to increase strength and stamina.   Short: Add in another round on the bike at home. Long: Continue to increase strength and stamina.   Short: Continue with intervals.  Long: Continue to increase strength and stamina.       Nutrition & Weight - Outcomes: Pre Biometrics - 10/28/18 1500      Pre Biometrics   Height  5' 10.5" (1.791 m)    Weight  165 lb (74.8 kg)    Waist Circumference  37 inches    Hip Circumference  38.5 inches    Waist to Hip Ratio  0.96 %    BMI (Calculated)  23.33    Single Leg Stand  25 seconds      Post Biometrics - 12/15/18 0911       Post  Biometrics   Height  5' 10.5" (1.791 m)    Weight  161 lb (73 kg)    Waist Circumference  35 inches    Hip Circumference  37 inches    Waist to Hip Ratio  0.95 %    BMI (Calculated)  22.77    Single Leg Stand  30 seconds       Nutrition: Nutrition Therapy & Goals - 10/28/18 1529      Intervention Plan   Intervention  Prescribe, educate and counsel regarding individualized specific dietary modifications aiming towards targeted core components such as weight, hypertension, lipid management, diabetes, heart failure and other comorbidities.;Nutrition handout(s) given to patient.    Expected Outcomes  Short Term Goal: Understand basic principles of dietary content, such as calories, fat, sodium, cholesterol and nutrients.;Short Term Goal: A plan has been developed  with personal nutrition goals set during dietitian appointment.;Long Term Goal: Adherence to prescribed nutrition plan.       Nutrition Discharge: Nutrition Assessments - 12/15/18 0756      MEDFICTS Scores   Pre Score  60    Post Score  40    Score Difference  -20       Education Questionnaire Score: Knowledge Questionnaire Score - 12/15/18 0757       Knowledge Questionnaire Score   Pre Score  26/26    Post Score  26/26       Goals reviewed with patient; copy given to patient.

## 2018-12-17 NOTE — Progress Notes (Signed)
Cardiac Individual Treatment Plan  Patient Details  Name: Rickey Sherman MRN: [id number] Date of Birth: 01/08/1946 Referring Provider:     Cardiac Rehab from 10/28/2018 in [facility] Cardiac and Pulmonary Rehab  Referring Provider  Edwina Barth      Initial Encounter Date:    Cardiac Rehab from 10/28/2018 in [facility] Cardiac and Pulmonary Rehab  Date  10/28/18      Visit Diagnosis: S/P CABG x 4  Patient's Home Medications on Admission:  Current Outpatient Medications:  .  acetaminophen (TYLENOL) 500 MG tablet, Take 1,000 mg by mouth daily as needed for moderate pain or headache., Disp: , Rfl:  .  allopurinol (ZYLOPRIM) 100 MG tablet, Take 100 mg by mouth every evening., Disp: , Rfl:  .  allopurinol (ZYLOPRIM) 100 MG tablet, TAKE 1 TABLET BY MOUTH ONCE DAILY, Disp: , Rfl:  .  aspirin EC 81 MG tablet, Take 81 mg by mouth every evening., Disp: , Rfl:  .  atorvastatin (LIPITOR) 80 MG tablet, Take by mouth., Disp: , Rfl:  .  B-D ULTRA-FINE 33 LANCETS MISC, Inject into the skin., Disp: , Rfl:  .  Blood Glucose Monitoring Suppl (ONE TOUCH ULTRA 2) w/Device KIT, Use as instructed., Disp: , Rfl:  .  empagliflozin (JARDIANCE) 25 MG TABS tablet, Take by mouth., Disp: , Rfl:  .  fluorouracil (EFUDEX) 5 % cream, Apply 1 application topically daily as needed (skin spots). , Disp: , Rfl: 1 .  glipiZIDE (GLUCOTROL) 10 MG tablet, TAKE 1 TABLET BY MOUTH TWO  TIMES DAILY BEFORE MEALS, Disp: , Rfl:  .  glucose blood (ONE TOUCH ULTRA TEST) test strip, Use once daily Use as instructed., Disp: , Rfl:  .  ibuprofen (ADVIL,MOTRIN) 200 MG tablet, Take 400 mg by mouth daily as needed for moderate pain., Disp: , Rfl:  .  isosorbide mononitrate (IMDUR) 30 MG 24 hr tablet, Take 30 mg by mouth daily., Disp: , Rfl:  .  isosorbide mononitrate (IMDUR) 30 MG 24 hr tablet, Take by mouth., Disp: , Rfl:  .  Lancets (ONETOUCH DELICA PLUS OXBDZH29J) MISC, , Disp: , Rfl:  .  losartan (COZAAR) 100 MG tablet, Take 100 mg by mouth daily.,  Disp: , Rfl:  .  losartan (COZAAR) 100 MG tablet, TAKE 1 TABLET BY MOUTH ONCE DAILY, Disp: , Rfl:  .  metFORMIN (GLUCOPHAGE) 1000 MG tablet, Take 1,000 mg by mouth 2 (two) times daily., Disp: , Rfl:  .  metoprolol succinate (TOPROL-XL) 25 MG 24 hr tablet, Take 25 mg by mouth daily., Disp: , Rfl:  .  metoprolol succinate (TOPROL-XL) 25 MG 24 hr tablet, Take by mouth., Disp: , Rfl:  .  metoprolol tartrate (LOPRESSOR) 25 MG tablet, Take by mouth 2 (two) times daily. , Disp: , Rfl:  .  omeprazole (PRILOSEC) 40 MG capsule, TAKE 1 CAPSULE BY MOUTH  ONCE A DAY, Disp: , Rfl:  .  pioglitazone (ACTOS) 15 MG tablet, Take 15 mg by mouth every evening., Disp: , Rfl:  .  pioglitazone (ACTOS) 15 MG tablet, Take by mouth., Disp: , Rfl:  .  vitamin B-12 (CYANOCOBALAMIN) 1000 MCG tablet, Take by mouth., Disp: , Rfl:   Past Medical History: Past Medical History:  Diagnosis Date  . Cancer ([place])    basal cell carcinoma  . Coronary artery disease   . Diabetes mellitus without complication ([place])   . Hypertension     Tobacco Use: Social History   Tobacco Use  Smoking Status Never Smoker  Smokeless Tobacco Never  Used    Labs: Recent Review Flowsheet Data    There is no flowsheet data to display.       Exercise Target Goals: Exercise Program Goal: Individual exercise prescription set using results from initial 6 min walk test and THRR while considering  patient's activity barriers and safety.   Exercise Prescription Goal: Initial exercise prescription builds to 30-45 minutes a day of aerobic activity, 2-3 days per week.  Home exercise guidelines will be given to patient during program as part of exercise prescription that the participant will acknowledge.  Activity Barriers & Risk Stratification: Activity Barriers & Cardiac Risk Stratification - 10/28/18 1532      Activity Barriers & Cardiac Risk Stratification   Activity Barriers  Other (comment)    Comments  leg incision still healing     Cardiac Risk Stratification  High       6 Minute Walk: 6 Minute Walk    Row Name 10/28/18 1502 12/15/18 0910       6 Minute Walk   Phase  Initial  Discharge    Distance  1520 feet  2068 feet    Distance % Change  -  36.1 %    Distance Feet Change  -  548 ft    Walk Time  6 minutes  6 minutes    # of Rest Breaks  0  0    MPH  2.88  3.92    METS  3.93  5    RPE  12  12    Perceived Dyspnea   0  0    VO2 Peak  13.8  17.51    Symptoms  No  No    Resting HR  63 bpm  80 bpm    Resting BP  110/58  134/70    Resting Oxygen Saturation   99 %  -    Exercise Oxygen Saturation  during 6 min walk  99 %  94 %    Max Ex. HR  117 bpm  127 bpm    Max Ex. BP  156/58  152/74    2 Minute Post BP  126/58  -       Oxygen Initial Assessment:   Oxygen Re-Evaluation:   Oxygen Discharge (Final Oxygen Re-Evaluation):   Initial Exercise Prescription: Initial Exercise Prescription - 10/28/18 1500      Date of Initial Exercise RX and Referring Provider   Date  10/28/18    Referring Provider  Edwina Barth      Treadmill   MPH  2.8    Grade  1.5    Minutes  15    METs  3.7      Recumbant Bike   Level  4    RPM  60    Watts  46    Minutes  15    METs  3.7      REL-XR   Level  3    Speed  50    Minutes  15    METs  3.7      Prescription Details   Frequency (times per week)  3    Duration  Progress to 45 minutes of aerobic exercise without signs/symptoms of physical distress      Intensity   THRR 40-80% of Max Heartrate  97-132    Ratings of Perceived Exertion  11-13    Perceived Dyspnea  0-4      Resistance Training   Training Prescription  Yes    Weight  3 lb    Reps  10-15       Perform Capillary Blood Glucose checks as needed.  Exercise Prescription Changes: Exercise Prescription Changes    Row Name 10/28/18 1500 11/10/18 0900 11/23/18 0900 12/08/18 1700       Response to Exercise   Blood Pressure (Admit)  110/58  122/60  114/60  132/74    Blood Pressure  (Exercise)  156/58  138/52  124/70  128/70    Blood Pressure (Exit)  126/58  132/70  102/62  108/50    Heart Rate (Admit)  64 bpm  67 bpm  73 bpm  63 bpm    Heart Rate (Exercise)  107 bpm  129 bpm  119 bpm  132 bpm    Heart Rate (Exit)  71 bpm  81 bpm  79 bpm  89 bpm    Oxygen Saturation (Admit)  99 %  -  -  -    Oxygen Saturation (Exit)  99 %  -  -  -    Rating of Perceived Exertion (Exercise)  12  15  12  14     Symptoms  -  none  none  none    Duration  -  Continue with 45 min of aerobic exercise without signs/symptoms of physical distress.  Continue with 45 min of aerobic exercise without signs/symptoms of physical distress.  Continue with 45 min of aerobic exercise without signs/symptoms of physical distress.    Intensity  -  THRR unchanged  THRR unchanged  THRR unchanged      Progression   Progression  -  Continue to progress workloads to maintain intensity without signs/symptoms of physical distress.  Continue to progress workloads to maintain intensity without signs/symptoms of physical distress.  Continue to progress workloads to maintain intensity without signs/symptoms of physical distress.    Average METs  -  4.07  3.96  7.59      Resistance Training   Training Prescription  -  Yes  Yes  Yes    Weight  -  3 lbs  7 lbs  5 lbs    Reps  -  10-15  10-15  10-15      Interval Training   Interval Training  -  No  No  No      Treadmill   MPH  -  2.8  3  3     Grade  -  1  2  8     Minutes  -  15  15  15     METs  -  3.53  4.12  6.6      Recumbant Bike   Level  -  2  6  6     Watts  -  36  36  57    Minutes  -  15  15  15     METs  -  3.47  3.47  4.46      REL-XR   Level  -  3  3  8     Minutes  -  15  15  15     METs  -  5.2  4.3  11.7      Home Exercise Plan   Plans to continue exercise at  -  Home (comment) walking and weights  Home (comment) walking and weights  Home (comment) walking and weights    Frequency  -  Add 2 additional days to program exercise sessions.  Add 2  additional days to program exercise sessions.  Add 2 additional days to program exercise sessions.    Initial Home Exercises Provided  -  11/10/18  11/10/18  11/10/18       Exercise Comments: Exercise Comments    Row Name 11/01/18 0849 12/17/18 0942         Exercise Comments  First full day of exercise!  Patient was oriented to gym and equipment including functions, settings, policies, and procedures.  Patient's individual exercise prescription and treatment plan were reviewed.  All starting workloads were established based on the results of the 6 minute walk test done at initial orientation visit.  The plan for exercise progression was also introduced and progression will be customized based on patient's performance and goals.  Prem graduated today from  rehab with 35 sessions completed.  Details of the patient's exercise prescription and what He needs to do in order to continue the prescription and progress were discussed with patient.  Patient was given a copy of prescription and goals.  Patient verbalized understanding.  Felipe plans to continue to exercise by walking and using [employer] bike at home.         Exercise Goals and Review: Exercise Goals    Row Name 10/28/18 1501             Exercise Goals   Increase Physical Activity  Yes       Intervention  Provide advice, education, support and counseling about physical activity/exercise needs.;Develop an individualized exercise prescription for aerobic and resistive training based on initial evaluation findings, risk stratification, comorbidities and participant's personal goals.       Expected Outcomes  Short Term: Attend rehab on a regular basis to increase amount of physical activity.;Long Term: Add in home exercise to make exercise part of routine and to increase amount of physical activity.;Long Term: Exercising regularly at least 3-5 days a week.       Increase Strength and Stamina  Yes       Intervention  Provide advice,  education, support and counseling about physical activity/exercise needs.;Develop an individualized exercise prescription for aerobic and resistive training based on initial evaluation findings, risk stratification, comorbidities and participant's personal goals.       Expected Outcomes  Short Term: Increase workloads from initial exercise prescription for resistance, speed, and METs.;Short Term: Perform resistance training exercises routinely during rehab and add in resistance training at home;Long Term: Improve cardiorespiratory fitness, muscular endurance and strength as measured by increased METs and functional capacity (6MWT)       Able to understand and use rate of perceived exertion (RPE) scale  Yes       Intervention  Provide education and explanation on how to use RPE scale       Expected Outcomes  Short Term: Able to use RPE daily in rehab to express subjective intensity level;Long Term:  Able to use RPE to guide intensity level when exercising independently       Knowledge and understanding of Target Heart Rate Range (THRR)  Yes       Intervention  Provide education and explanation of THRR including how the numbers were predicted and where they are located for reference       Expected Outcomes  Short Term: Able to state/look up THRR;Short Term: Able to use daily as guideline for intensity in rehab;Long Term: Able to use THRR to govern intensity when exercising independently       Able to check pulse independently  Yes       Intervention  Provide education and demonstration on how to check pulse in carotid and radial arteries.;Review the importance of being able to check your own pulse for safety during independent exercise       Expected Outcomes  Short Term: Able to explain why pulse checking is important during independent exercise;Long Term: Able to check pulse independently and accurately       Understanding of Exercise Prescription  Yes       Intervention  Provide education, explanation,  and written materials on patient's individual exercise prescription       Expected Outcomes  Short Term: Able to explain program exercise prescription;Long Term: Able to explain home exercise prescription to exercise independently          Exercise Goals Re-Evaluation : Exercise Goals Re-Evaluation    Row Name 11/01/18 0849 11/10/18 0908 11/23/18 0925 11/26/18 0828 12/08/18 1704     Exercise Goal Re-Evaluation   Exercise Goals Review  Increase Physical Activity;Increase Strength and Stamina;Able to understand and use rate of perceived exertion (RPE) scale;Knowledge and understanding of Target Heart Rate Range (THRR);Understanding of Exercise Prescription  Increase Physical Activity;Increase Strength and Stamina;Able to understand and use rate of perceived exertion (RPE) scale;Knowledge and understanding of Target Heart Rate Range (THRR);Able to check pulse independently;Understanding of Exercise Prescription  Increase Physical Activity;Increase Strength and Stamina;Understanding of Exercise Prescription  Increase Physical Activity;Increase Strength and Stamina;Understanding of Exercise Prescription  Increase Physical Activity;Increase Strength and Stamina;Understanding of Exercise Prescription   Comments  Reviewed RPE scale, THR and program prescription with pt today.  Pt voiced understanding and was given a copy of goals to take home.   Reviewed home exercise with pt today.  Pt plans to walk and use weights at home for exercise.  Reviewed THR, pulse, RPE, sign and symptoms, and when to call 911 or MD.  Also discussed weather considerations and indoor options.  Pt voiced understanding.  Demeco is doing well in rehab.  He is now up level 6 on the recumbent bike and using 7 lbs hand weights.  We will continue to monitor his progress.   Alejo is doing well in rehab.  He is getting in 40mn a day on the [employer] bike at home.  He plans to start walking once the weather gets better.  He has also been nursing a  leg wound that has been holding him.  We talked about adding in another round on the Air[employer]yne to get his total 30 min.  He is also looking forwad to playing golf again and he practices for two hours a day on his non playing days.   He is getting his strength and stamina back slowly but surely.   JOvidiocontinues to do well in rehab.  He has now started doing intervals and has found them challenging.  We will continue to encourage his intervals and continue to monitor his progress.    Expected Outcomes  Short: Use RPE daily to regulate intensity. Long: Follow program prescription in THR.  Short: Start to add in more walking at home.  Long: Continue to add in exercise at home.  Short: Talk about adding in intervals.  Long: Continue to increase strength and stamina.   Short: Add in another round on the bike at home. Long: Continue to increase strength and stamina.   Short: Continue with intervals.  Long: Continue to increase strength and stamina.       Discharge Exercise Prescription (Final Exercise Prescription Changes): Exercise Prescription Changes - 12/08/18 1700  Response to Exercise   Blood Pressure (Admit)  132/74    Blood Pressure (Exercise)  128/70    Blood Pressure (Exit)  108/50    Heart Rate (Admit)  63 bpm    Heart Rate (Exercise)  132 bpm    Heart Rate (Exit)  89 bpm    Rating of Perceived Exertion (Exercise)  14    Symptoms  none    Duration  Continue with 45 min of aerobic exercise without signs/symptoms of physical distress.    Intensity  THRR unchanged      Progression   Progression  Continue to progress workloads to maintain intensity without signs/symptoms of physical distress.    Average METs  7.59      Resistance Training   Training Prescription  Yes    Weight  5 lbs    Reps  10-15      Interval Training   Interval Training  No      Treadmill   MPH  3    Grade  8    Minutes  15    METs  6.6      Recumbant Bike   Level  6    Watts  57    Minutes  15     METs  4.46      REL-XR   Level  8    Minutes  15    METs  11.7      Home Exercise Plan   Plans to continue exercise at  Home (comment)   walking and weights   Frequency  Add 2 additional days to program exercise sessions.    Initial Home Exercises Provided  11/10/18       Nutrition:  Target Goals: Understanding of nutrition guidelines, daily intake of sodium <1593m, cholesterol <2085m calories 30% from fat and 7% or less from saturated fats, daily to have 5 or more servings of fruits and vegetables.  Biometrics: Pre Biometrics - 10/28/18 1500      Pre Biometrics   Height  5' 10.5" (1.791 m)    Weight  165 lb (74.8 kg)    Waist Circumference  37 inches    Hip Circumference  38.5 inches    Waist to Hip Ratio  0.96 %    BMI (Calculated)  23.33    Single Leg Stand  25 seconds      Post Biometrics - 12/15/18 0911       Post  Biometrics   Height  5' 10.5" (1.791 m)    Weight  161 lb (73 kg)    Waist Circumference  35 inches    Hip Circumference  37 inches    Waist to Hip Ratio  0.95 %    BMI (Calculated)  22.77    Single Leg Stand  30 seconds       Nutrition Therapy Plan and Nutrition Goals: Nutrition Therapy & Goals - 10/28/18 1529      Intervention Plan   Intervention  Prescribe, educate and counsel regarding individualized specific dietary modifications aiming towards targeted core components such as weight, hypertension, lipid management, diabetes, heart failure and other comorbidities.;Nutrition handout(s) given to patient.    Expected Outcomes  Short Term Goal: Understand basic principles of dietary content, such as calories, fat, sodium, cholesterol and nutrients.;Short Term Goal: A plan has been developed with personal nutrition goals set during dietitian appointment.;Long Term Goal: Adherence to prescribed nutrition plan.       Nutrition Assessments: Nutrition Assessments - 12/15/18 [id number]  MEDFICTS Scores   Pre Score  60    Post Score  40    Score  Difference  -20       Nutrition Goals Re-Evaluation: Nutrition Goals Re-Evaluation    [place] Name 11/26/18 [phone]             Goals   Nutrition Goal  Increase fluid intake, start to watch salt in take more closely, eat more complex carbs       Comment  Darvell has been working on his diet.  He has been able to improve his blood sugars and dropped his A1c under six.  He keeps a close eye on his carbs.  However, he is still getting a lot of simple carbs which will spike his sugars.  We talked about options for more complex carbs including popcorn.  We also talk about adding in protein with his simple carbs for better balance.  We talked about reading food lables to monitor his salt intake as well.  He is also going to try to make sure that he is getting enough fluid as well.        Expected Outcome  Short: Try more complex carbs.  Long: Watch sodium intake more closely.           Nutrition Goals Discharge (Final Nutrition Goals Re-Evaluation): Nutrition Goals Re-Evaluation - 11/26/18 0836      Goals   Nutrition Goal  Increase fluid intake, start to watch salt in take more closely, eat more complex carbs    Comment  Joanna has been working on his diet.  He has been able to improve his blood sugars and dropped his A1c under six.  He keeps a close eye on his carbs.  However, he is still getting a lot of simple carbs which will spike his sugars.  We talked about options for more complex carbs including popcorn.  We also talk about adding in protein with his simple carbs for better balance.  We talked about reading food lables to monitor his salt intake as well.  He is also going to try to make sure that he is getting enough fluid as well.     Expected Outcome  Short: Try more complex carbs.  Long: Watch sodium intake more closely.        Psychosocial: Target Goals: Acknowledge presence or absence of significant depression and/or stress, maximize coping skills, provide positive support system.  Participant is able to verbalize types and ability to use techniques and skills needed for reducing stress and depression.   Initial Review & Psychosocial Screening: Initial Psych Review & Screening - 10/28/18 1526      Initial Review   Current issues with  Current Stress Concerns    Source of Stress Concerns  Unable to participate in former interests or hobbies;Unable to perform yard/household activities    Comments  He is almost 2 months post CABG. 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 He is used to playing golf about 3 x a week and lives a social active lifestyle.       Family Dynamics   Good Support System?  Yes   Life partner Parke Simmers)     Barriers   Psychosocial barriers to participate in program  There are no identifiable barriers or psychosocial needs.;The patient should benefit from training in stress management and relaxation.      Screening Interventions   Interventions  Encouraged to  exercise;Program counselor consult;To provide support and resources with identified psychosocial needs;Provide feedback about the scores to participant    Expected Outcomes  Short Term goal: Utilizing psychosocial counselor, staff and physician to assist with identification of specific Stressors or current issues interfering with healing process. Setting desired goal for each stressor or current issue identified.;Long Term Goal: Stressors or current issues are controlled or eliminated.;Short Term goal: Identification and review with participant of any Quality of Life or Depression concerns found by scoring the questionnaire.;Long Term goal: The participant improves quality of Life and PHQ9 Scores as seen by post scores and/or verbalization of changes       Quality of Life Scores:  Quality of Life - 12/15/18 0757      Quality of Life   Select  Quality of Life      Quality of Life Scores   Health/Function Pre  26.8 %    Health/Function Post  29.03 %     Health/Function % Change  8.32 %    Socioeconomic Pre  28.63 %    Socioeconomic Post  28 %    Socioeconomic % Change   -2.2 %    Psych/Spiritual Pre  26.57 %    Psych/Spiritual Post  28.93 %    Psych/Spiritual % Change  8.88 %    Family Pre  28.8 %    Family Post  26.3 %    Family % Change  -8.68 %    GLOBAL Pre  27.46 %    GLOBAL Post  27.7 %    GLOBAL % Change  0.87 %      Scores of 19 and below usually indicate a poorer quality of life in these areas.  A difference of  2-3 points is a clinically meaningful difference.  A difference of 2-3 points in the total score of the Quality of Life Index has been associated with significant improvement in overall quality of life, self-image, physical symptoms, and general health in studies assessing change in quality of life.  PHQ-9: Recent Review Flowsheet Data    Depression screen [facility] 2/9 12/15/2018 10/28/2018   Decreased Interest 0 0   Down, Depressed, Hopeless 0 0   PHQ - 2 Score 0 0   Altered sleeping 0 1   Tired, decreased energy 0 0   Change in appetite 0 0   Feeling bad or failure about yourself  0 1   Trouble concentrating 0 0   Moving slowly or fidgety/restless 0 0   Suicidal thoughts 0 0   PHQ-9 Score 0 2   Difficult doing work/chores Not difficult at all Not difficult at all     Interpretation of Total Score  Total Score Depression Severity:  1-4 = Minimal depression, 5-9 = Mild depression, 10-14 = Moderate depression, 15-19 = Moderately severe depression, 20-27 = Severe depression   Psychosocial Evaluation and Intervention: Psychosocial Evaluation - 11/01/18 0927      Psychosocial Evaluation & Interventions   Interventions  Stress management education;Encouraged to exercise with the program and follow exercise prescription    Comments  Counselor met with Mr. Pavel Hinz) today for initial psychosocial evaluation.  He is a 73 year old who had a CABGx4 on 11/12.  He also is a diabetic.  Brelan has a strong support system with  a partner of 30 years; neighbors and a host of golf friends.  He reports sleeping well and his appetite has improved recently since the surgery.  He denies a history of depression  or anxiety or any current symptoms and is typically in a positive mood.  His primary stress is his health and the recovery of his leg that is not healing well from surgery.  He has seen a wound specialist and feels confident it is improving now.  Jakeim has goals to return to playing golf 3x/week and improve his stamina and strength.  Staff will follow with Jeneen Rinks throughout the course of this program.      Expected Outcomes  Short:  Rucker will exercise consistently to increase his stamina and strength.  He will attend the educational components of this program in order to manage the stress in his life more positively.  Long:  Danniel will get back in the routine of consistent exercise in order to return to the golf course 3x/week, upon Dr's approval.    Continue Psychosocial Services   Follow up required by staff       Psychosocial Re-Evaluation: Psychosocial Re-Evaluation    [place] Name 11/26/18 [phone]             Psychosocial Re-Evaluation   Current issues with  None Identified       Comments  Macedonio is doing well mentally. He is enjoying coming to class and doesn't even mind the time of day anymore. His partner has started to come to class with his and she is enjoying it too.  He sleeps well most nights, but does need tylenol on occassion for his leg pain to help him get to sleep.  He usually gets between 7-8 hours each night. He is really looking forward to being able to get back out on the golf course again next month!!       Expected Outcomes  Short: Continue to attend class regularly.  Long: Return to golfing and practicing self care.        Interventions  Encouraged to attend Cardiac Rehabilitation for the exercise       Continue Psychosocial Services   Follow up required by staff          Psychosocial Discharge  (Final Psychosocial Re-Evaluation): Psychosocial Re-Evaluation - 11/26/18 0846      Psychosocial Re-Evaluation   Current issues with  None Identified    Comments  Caidyn is doing well mentally. He is enjoying coming to class and doesn't even mind the time of day anymore. His partner has started to come to class with his and she is enjoying it too.  He sleeps well most nights, but does need tylenol on occassion for his leg pain to help him get to sleep.  He usually gets between 7-8 hours each night. He is really looking forward to being able to get back out on the golf course again next month!!    Expected Outcomes  Short: Continue to attend class regularly.  Long: Return to golfing and practicing self care.     Interventions  Encouraged to attend Cardiac Rehabilitation for the exercise    Continue Psychosocial Services   Follow up required by staff       Vocational Rehabilitation: Provide vocational rehab assistance to qualifying candidates.   Vocational Rehab Evaluation & Intervention: Vocational Rehab - 10/28/18 1526      Initial Vocational Rehab Evaluation & Intervention   Assessment shows need for Vocational Rehabilitation  No       Education: Education Goals: Education classes will be provided on a variety of topics geared toward better understanding of heart health and risk factor modification. Participant will state understanding/return  demonstration of topics presented as noted by education test scores.  Learning Barriers/Preferences: Learning Barriers/Preferences - 10/28/18 1525      Learning Barriers/Preferences   Learning Barriers  None    Learning Preferences  None       Education Topics:  AED/CPR: - Group verbal and written instruction with the use of models to demonstrate the basic use of the AED with the basic ABC's of resuscitation.   Cardiac Rehab from 12/15/2018 in [facility] Cardiac and Pulmonary Rehab  Date  12/13/18  Educator  SB  Instruction Review Code  1-  Verbalizes Understanding      General Nutrition Guidelines/Fats and Fiber: -Group instruction provided by verbal, written material, models and posters to present the general guidelines for heart healthy nutrition. Gives an explanation and review of dietary fats and fiber.   Cardiac Rehab from 12/15/2018 in [facility] Cardiac and Pulmonary Rehab  Date  11/01/18  Educator  LB  Instruction Review Code  1- Verbalizes Understanding      Controlling Sodium/Reading Food Labels: -Group verbal and written material supporting the discussion of sodium use in heart healthy nutrition. Review and explanation with models, verbal and written materials for utilization of the food label.   Cardiac Rehab from 12/15/2018 in [facility] Cardiac and Pulmonary Rehab  Date  11/03/18  Educator  LB  Instruction Review Code  1- Verbalizes Understanding      Exercise Physiology & General Exercise Guidelines: - Group verbal and written instruction with models to review the exercise physiology of the cardiovascular system and associated critical values. Provides general exercise guidelines with specific guidelines to those with heart or lung disease.    Cardiac Rehab from 12/15/2018 in [facility] Cardiac and Pulmonary Rehab  Date  11/08/18  Educator  [facility]  Instruction Review Code  1- Verbalizes Understanding      Aerobic Exercise & Resistance Training: - Gives group verbal and written instruction on the various components of exercise. Focuses on aerobic and resistive training programs and the benefits of this training and how to safely progress through these programs..   Cardiac Rehab from 12/15/2018 in [facility] Cardiac and Pulmonary Rehab  Date  11/10/18  Educator  [facility]  Instruction Review Code  1- Verbalizes Understanding      Flexibility, Balance, Mind/Body Relaxation: Provides group verbal/written instruction on the benefits of flexibility and balance training, including mind/body exercise modes such as yoga, pilates and tai chi.   Demonstration and skill practice provided.   Cardiac Rehab from 12/15/2018 in [facility] Cardiac and Pulmonary Rehab  Date  11/15/18  Educator  [facility]  Instruction Review Code  1- Verbalizes Understanding      Stress and Anxiety: - Provides group verbal and written instruction about the health risks of elevated stress and causes of high stress.  Discuss the correlation between heart/lung disease and anxiety and treatment options. Review healthy ways to manage with stress and anxiety.   Cardiac Rehab from 12/15/2018 in [facility] Cardiac and Pulmonary Rehab  Date  12/08/18  Educator  [facility]  Instruction Review Code  1- Verbalizes Understanding      Depression: - Provides group verbal and written instruction on the correlation between heart/lung disease and depressed mood, treatment options, and the stigmas associated with seeking treatment.   Anatomy & Physiology of the Heart: - Group verbal and written instruction and models provide basic cardiac anatomy and physiology, with the coronary electrical and arterial systems. Review of Valvular disease and Heart Failure   Cardiac Rehab from 12/15/2018 in  [place] Cardiac and Pulmonary Rehab  Date  11/22/18  Educator  SB  Instruction Review Code  1- Verbalizes Understanding      Cardiac Procedures: - Group verbal and written instruction to review commonly prescribed medications for heart disease. Reviews the medication, class of the drug, and side effects. Includes the steps to properly store meds and maintain the prescription regimen. (beta blockers and nitrates)   Cardiac Rehab from 12/15/2018 in [facility] Cardiac and Pulmonary Rehab  Date  12/06/18  Educator  SB  Instruction Review Code  1- Verbalizes Understanding      Cardiac Medications I: - Group verbal and written instruction to review commonly prescribed medications for heart disease. Reviews the medication, class of the drug, and side effects. Includes the steps to properly store meds and maintain the  prescription regimen.   Cardiac Rehab from 12/15/2018 in [facility] Cardiac and Pulmonary Rehab  Date  11/29/18  Educator  SB  Instruction Review Code  1- Verbalizes Understanding      Cardiac Medications II: -Group verbal and written instruction to review commonly prescribed medications for heart disease. Reviews the medication, class of the drug, and side effects. (all other drug classes)   Cardiac Rehab from 12/15/2018 in [facility] Cardiac and Pulmonary Rehab  Date  11/17/18  Educator  [facility]  Instruction Review Code  1- Verbalizes Understanding       Go Sex-Intimacy & Heart Disease, Get SMART - Goal Setting: - Group verbal and written instruction through game format to discuss heart disease and the return to sexual intimacy. Provides group verbal and written material to discuss and apply goal setting through the application of the S.M.A.R.T. Method.   Cardiac Rehab from 12/15/2018 in [facility] Cardiac and Pulmonary Rehab  Date  12/06/18  Educator  SB  Instruction Review Code  1- Verbalizes Understanding      Other Matters of the Heart: - Provides group verbal, written materials and models to describe Stable Angina and Peripheral Artery. Includes description of the disease process and treatment options available to the cardiac patient.   Cardiac Rehab from 12/15/2018 in [facility] Cardiac and Pulmonary Rehab  Date  11/22/18  Educator  SB  Instruction Review Code  1- Verbalizes Understanding      Exercise & Equipment Safety: - Individual verbal instruction and demonstration of equipment use and safety with use of the equipment.   Cardiac Rehab from 12/15/2018 in [facility] Cardiac and Pulmonary Rehab  Date  10/28/18  Educator  [facility]  Instruction Review Code  1- Verbalizes Understanding      Infection Prevention: - Provides verbal and written material to individual with discussion of infection control including proper hand washing and proper equipment cleaning during exercise session.   Cardiac Rehab from  12/15/2018 in [facility] Cardiac and Pulmonary Rehab  Date  10/28/18  Educator  [facility]  Instruction Review Code  1- Verbalizes Understanding      Falls Prevention: - Provides verbal and written material to individual with discussion of falls prevention and safety.   Cardiac Rehab from 12/15/2018 in [facility] Cardiac and Pulmonary Rehab  Date  10/28/18  Educator  [facility]  Instruction Review Code  1- Verbalizes Understanding      Diabetes: - Individual verbal and written instruction to review signs/symptoms of diabetes, desired ranges of glucose level fasting, after meals and with exercise. Acknowledge that pre and post exercise glucose checks will be done for 3 sessions at entry of program.   Cardiac Rehab from 12/15/2018 in [facility] Cardiac and  Pulmonary Rehab  Date  10/28/18  Educator  [facility]  Instruction Review Code  1- Verbalizes Understanding      Know Your Numbers and Risk Factors: -Group verbal and written instruction about important numbers in your health.  Discussion of what are risk factors and how they play a role in the disease process.  Review of Cholesterol, Blood Pressure, Diabetes, and BMI and the role they play in your overall health.   Cardiac Rehab from 12/15/2018 in [facility] Cardiac and Pulmonary Rehab  Date  11/17/18  Educator  [facility]  Instruction Review Code  1- Verbalizes Understanding      Sleep Hygiene: -Provides group verbal and written instruction about how sleep can affect your health.  Define sleep hygiene, discuss sleep cycles and impact of sleep habits. Review good sleep hygiene tips.    Other: -Provides group and verbal instruction on various topics (see comments)   Knowledge Questionnaire Score: Knowledge Questionnaire Score - 12/15/18 0757      Knowledge Questionnaire Score   Pre Score  26/26    Post Score  26/26       Core Components/Risk Factors/Patient Goals at Admission: Personal Goals and Risk Factors at Admission - 10/28/18 1523      Core Components/Risk  Factors/Patient Goals on Admission    Weight Management  Yes;Weight Maintenance    Intervention  Weight Management: Develop a combined nutrition and exercise program designed to reach desired caloric intake, while maintaining appropriate intake of nutrient and fiber, sodium and fats, and appropriate energy expenditure required for the weight goal.;Weight Management: Provide education and appropriate resources to help participant work on and attain dietary goals.    Admit Weight  165 lb (74.8 kg)    Expected Outcomes  Short Term: Continue to assess and modify interventions until short term weight is achieved;Long Term: Adherence to nutrition and physical activity/exercise program aimed toward attainment of established weight goal;Weight Maintenance: Understanding of the daily nutrition guidelines, which includes 25-35% calories from fat, 7% or less cal from saturated fats, less than 231m cholesterol, less than 1.5gm of sodium, & 5 or more servings of fruits and vegetables daily;Understanding recommendations for meals to include 15-35% energy as protein, 25-35% energy from fat, 35-60% energy from carbohydrates, less than 208mof dietary cholesterol, 20-35 gm of total fiber daily;Understanding of distribution of calorie intake throughout the day with the consumption of 4-5 meals/snacks    Diabetes  Yes    Intervention  Provide education about signs/symptoms and action to take for hypo/hyperglycemia.;Provide education about proper nutrition, including hydration, and aerobic/resistive exercise prescription along with prescribed medications to achieve blood glucose in normal ranges: Fasting glucose 65-99 mg/dL    Expected Outcomes  Long Term: Attainment of HbA1C < 7%.    Hypertension  Yes    Intervention  Provide education on lifestyle modifcations including regular physical activity/exercise, weight management, moderate sodium restriction and increased consumption of fresh fruit, vegetables, and low fat dairy,  alcohol moderation, and smoking cessation.;Monitor prescription use compliance.    Expected Outcomes  Short Term: Continued assessment and intervention until BP is < 140/9068mG in hypertensive participants. < 130/57m46m in hypertensive participants with diabetes, heart failure or chronic kidney disease.;Long Term: Maintenance of blood pressure at goal levels.    Lipids  Yes    Intervention  Provide education and support for participant on nutrition & aerobic/resistive exercise along with prescribed medications to achieve LDL <70mg54mL >40mg.53mExpected Outcomes  Short Term: Participant states understanding  of desired cholesterol values and is compliant with medications prescribed. Participant is following exercise prescription and nutrition guidelines.;Long Term: Cholesterol controlled with medications as prescribed, with individualized exercise RX and with personalized nutrition plan. Value goals: LDL < 44m, HDL > 40 mg.       Core Components/Risk Factors/Patient Goals Review:  Goals and Risk Factor Review    Row Name 11/26/18 0831             Core Components/Risk Factors/Patient Goals Review   Personal Goals Review  Weight Management/Obesity;Diabetes;Hypertension;Lipids       Review  JKarronis doing well in rehab. He lost 15lbs coming out of hospital.  He has been staying steady around 165lbs.  This also helps him control his blood sugars better.  He checks them in the mornings and his last A1c was under six.  He is off of his insulin now.  He also checks his blood pressures in the morning too.  He is doing well with his medications overall.   He is still having some incisional soreness in chest.  He finanly was able to get a clip out of his leg wound but that is now healing.         Expected Outcomes  Short: Continue to monitor sugars.  Long: Continue to maintain weight.           Core Components/Risk Factors/Patient Goals at Discharge (Final Review):  Goals and Risk Factor Review -  11/26/18 0831      Core Components/Risk Factors/Patient Goals Review   Personal Goals Review  Weight Management/Obesity;Diabetes;Hypertension;Lipids    Review  JNorrisis doing well in rehab. He lost 15lbs coming out of hospital.  He has been staying steady around 165lbs.  This also helps him control his blood sugars better.  He checks them in the mornings and his last A1c was under six.  He is off of his insulin now.  He also checks his blood pressures in the morning too.  He is doing well with his medications overall.   He is still having some incisional soreness in chest.  He finanly was able to get a clip out of his leg wound but that is now healing.      Expected Outcomes  Short: Continue to monitor sugars.  Long: Continue to maintain weight.        ITP Comments: ITP Comments    Row Name 10/28/18 1507 11/17/18 0915 12/15/18 0555 12/17/18 0941     ITP Comments  Med Review completed. Initial ITP created. Diagnosis can be found in Care Everywhere 11/12  30 Day Review. Continue with ITP unless directed changes per Medical Director review.  30 Day Review. Continue with ITP unless directed changes per Medical Director review.  Discharge ITP sent and signed by Dr. MSabra Heck  Discharge Summary routed to PCP and cardiologist.       Comments: Discharge ITP

## 2018-12-20 ENCOUNTER — Encounter: Payer: Medicare Other | Attending: Internal Medicine

## 2018-12-20 DIAGNOSIS — T8131XD Disruption of external operation (surgical) wound, not elsewhere classified, subsequent encounter: Secondary | ICD-10-CM | POA: Insufficient documentation

## 2018-12-20 DIAGNOSIS — Z809 Family history of malignant neoplasm, unspecified: Secondary | ICD-10-CM | POA: Insufficient documentation

## 2018-12-20 DIAGNOSIS — I2510 Atherosclerotic heart disease of native coronary artery without angina pectoris: Secondary | ICD-10-CM | POA: Insufficient documentation

## 2018-12-20 DIAGNOSIS — I10 Essential (primary) hypertension: Secondary | ICD-10-CM | POA: Insufficient documentation

## 2018-12-20 DIAGNOSIS — M5412 Radiculopathy, cervical region: Secondary | ICD-10-CM | POA: Insufficient documentation

## 2018-12-20 DIAGNOSIS — E11621 Type 2 diabetes mellitus with foot ulcer: Secondary | ICD-10-CM | POA: Insufficient documentation

## 2018-12-20 DIAGNOSIS — Z85828 Personal history of other malignant neoplasm of skin: Secondary | ICD-10-CM | POA: Insufficient documentation

## 2018-12-20 DIAGNOSIS — L97222 Non-pressure chronic ulcer of left calf with fat layer exposed: Secondary | ICD-10-CM | POA: Insufficient documentation

## 2018-12-20 DIAGNOSIS — M109 Gout, unspecified: Secondary | ICD-10-CM | POA: Insufficient documentation

## 2018-12-20 DIAGNOSIS — Z8249 Family history of ischemic heart disease and other diseases of the circulatory system: Secondary | ICD-10-CM | POA: Insufficient documentation

## 2018-12-20 DIAGNOSIS — E1142 Type 2 diabetes mellitus with diabetic polyneuropathy: Secondary | ICD-10-CM | POA: Insufficient documentation

## 2018-12-20 DIAGNOSIS — Z951 Presence of aortocoronary bypass graft: Secondary | ICD-10-CM | POA: Insufficient documentation

## 2018-12-27 NOTE — Progress Notes (Signed)
RAHKEEM, SENFT ([id number]) Visit Report for 11/17/2018 Arrival Information Details Patient Name: Rickey Sherman, Rickey Sherman. Date of Service: 11/17/2018 1:30 PM Medical Record Number: [id number] Patient Account Number: [id number] Date of Birth/Sex: 06-21-1946 (73 y.o. M) Treating RN: Primary Care Jaianna Nicoll: Harrel Lemon Other Clinician: Referring Anavey Coombes: Harrel Lemon Treating Dosha Broshears/Extender: Tito Dine in Treatment: 3 Visit Information History Since Last Visit Added or deleted any medications: No Patient Arrived: Ambulatory Any new allergies or adverse reactions: No Arrival Time: 13:31 Had a fall or experienced change in No Accompanied By: friend activities of daily living that may affect Transfer Assistance: None risk of falls: Patient Identification Verified: Yes Signs or symptoms of abuse/neglect since last visito No Secondary Verification Process Completed: Yes Hospitalized since last visit: No Implantable device outside of the clinic excluding No cellular tissue based products placed in the center since last visit: Has Dressing in Place as Prescribed: Yes Pain Present Now: No Electronic Signature(s) Signed: 11/17/2018 3:53:44 PM By: Lorine Bears RCP, RRT, CHT Entered By: Lorine Bears on 11/17/2018 13:31:57 Rickey Sherman ([id number]) -------------------------------------------------------------------------------- Encounter Discharge Information Details Patient Name: Rickey Sherman, Rickey Sherman. Date of Service: 11/17/2018 1:30 PM Medical Record Number: [id number] Patient Account Number: [id number] Date of Birth/Sex: 1946/05/08 (73 y.o. M) Treating RN: Cornell Barman Primary Care Gerard Cantara: Harrel Lemon Other Clinician: Referring Josmar Messimer: Harrel Lemon Treating Doris Mcgilvery/Extender: Tito Dine in Treatment: 3 Encounter Discharge Information Items Discharge Condition: Stable Ambulatory Status: Ambulatory Discharge Destination:  Home Transportation: Other Accompanied By: wife Schedule Follow-up Appointment: Yes Clinical Summary of Care: Electronic Signature(s) Signed: 11/17/2018 6:00:27 PM By: Gretta Cool, BSN, RN, CWS, Kim RN, BSN Entered By: Gretta Cool, BSN, RN, CWS, Kim on 11/17/2018 14:45:50 Rickey Sherman ([id number]) -------------------------------------------------------------------------------- Lower Extremity Assessment Details Patient Name: Rickey Sherman, Rickey Sherman. Date of Service: 11/17/2018 1:30 PM Medical Record Number: [id number] Patient Account Number: [id number] Date of Birth/Sex: 13-Dec-1945 (73 y.o. M) Treating RN: Harold Barban Primary Care Parrish Bonn: Harrel Lemon Other Clinician: Referring Ellarose Brandi: Harrel Lemon Treating Laquisha Northcraft/Extender: Tito Dine in Treatment: 3 Edema Assessment Assessed: [Left: No] [Right: No] [Left: Edema] [Right: :] Calf Left: Right: Point of Measurement: 33 cm From Medial Instep 31.5 cm cm Ankle Left: Right: Point of Measurement: 11 cm From Medial Instep 20 cm cm Vascular Assessment Claudication: Claudication Assessment [Left:None] Pulses: Dorsalis Pedis Palpable: [Left:Yes] Posterior Tibial Palpable: [Left:Yes] Extremity colors, hair growth, and conditions: Extremity Color: [Left:Hyperpigmented] Hair Growth on Extremity: [Left:Yes] Temperature of Extremity: [Left:Warm] Capillary Refill: [Left:< 3 seconds] Toe Nail Assessment Left: Right: Thick: No Discolored: No Deformed: No Improper Length and Hygiene: No Electronic Signature(s) Signed: 12/27/2018 10:53:34 AM By: Harold Barban Entered By: Harold Barban on 11/17/2018 14:00:50 Rickey Sherman ([id number]) -------------------------------------------------------------------------------- Multi Wound Chart Details Patient Name: Rickey Sherman. Date of Service: 11/17/2018 1:30 PM Medical Record Number: [id number] Patient Account Number: [id number] Date of Birth/Sex: Nov 06, 1945 (73 y.o. M) Treating RN:  Cornell Barman Primary Care Jahanna Raether: Harrel Lemon Other Clinician: Referring Natalea Sutliff: Harrel Lemon Treating Jerris Fleer/Extender: Tito Dine in Treatment: 3 Vital Signs Height(in): 38 Pulse(bpm): 66 Weight(lbs): 166 Blood Pressure(mmHg): 127/54 Body Mass Index(BMI): 25 Temperature(F): 98.2 Respiratory Rate 16 (breaths/min): Photos: [1:No Photos] [N/A:N/A] Wound Location: [1:Left Lower Leg - Medial] [N/A:N/A] Wounding Event: [1:Surgical Injury] [N/A:N/A] Primary Etiology: [1:Diabetic Wound/Ulcer of the Lower Extremity] [N/A:N/A] Comorbid History: [1:Coronary Artery Disease, Hypertension, Type II Diabetes, Gout] [N/A:N/A] Date Acquired: [1:08/31/2018] [N/A:N/A] Weeks of Treatment: [1:3] [N/A:N/A] Wound Status: [1:Open] [N/A:N/A] Measurements L x W x D [1:0.9x0.7x0.2] [N/A:N/A] (cm)  Area (cm) : [1:0.495] [N/A:N/A] Volume (cm) : [1:0.099] [N/A:N/A] % Reduction in Area: [1:53.30%] [N/A:N/A] % Reduction in Volume: [1:76.70%] [N/A:N/A] Classification: [1:Grade 2] [N/A:N/A] Exudate Amount: [1:Medium] [N/A:N/A] Exudate Type: [1:Serous] [N/A:N/A] Exudate Color: [1:amber] [N/A:N/A] Wound Margin: [1:Flat and Intact] [N/A:N/A] Granulation Amount: [1:Small (1-33%)] [N/A:N/A] Granulation Quality: [1:Pale] [N/A:N/A] Necrotic Amount: [1:Large (67-100%)] [N/A:N/A] Exposed Structures: [1:Fat Layer (Subcutaneous Tissue) Exposed: Yes Fascia: No Tendon: No Muscle: No Joint: No Bone: No] [N/A:N/A] Epithelialization: [1:None] [N/A:N/A] Periwound Skin Texture: [1:Excoriation: No Induration: No Callus: No Crepitus: No] [N/A:N/A] Rash: No Scarring: No Periwound Skin Moisture: Dry/Scaly: Yes N/A N/A Maceration: No Periwound Skin Color: Atrophie Blanche: No N/A N/A Cyanosis: No Ecchymosis: No Erythema: No Hemosiderin Staining: No Mottled: No Pallor: No Rubor: No Temperature: No Abnormality N/A N/A Tenderness on Palpation: Yes N/A N/A Wound Preparation: Ulcer Cleansing:  N/A N/A Rinsed/Irrigated with Saline, Other: soap and water Topical Anesthetic Applied: Other: lidocaine 4% Treatment Notes Wound #1 (Left, Medial Lower Leg) Notes iodoflex, abd, kerlix/coban wrap with unna to anchor Electronic Signature(s) Signed: 11/18/2018 9:49:40 AM By: Linton Ham MD Entered By: Linton Ham on 11/17/2018 15:55:59 Rickey Sherman ([id number]) -------------------------------------------------------------------------------- Multi-Disciplinary Care Plan Details Patient Name: Rickey Sherman, Rickey Sherman. Date of Service: 11/17/2018 1:30 PM Medical Record Number: [id number] Patient Account Number: [id number] Date of Birth/Sex: 1945/11/11 (73 y.o. M) Treating RN: Cornell Barman Primary Care Kirandeep Fariss: Harrel Lemon Other Clinician: Referring Julianne Chamberlin: Harrel Lemon Treating Carey Johndrow/Extender: Tito Dine in Treatment: 3 Active Inactive Abuse / Safety / Falls / Self Care Management Nursing Diagnoses: Potential for falls Goals: Patient will not experience any injury related to falls Date Initiated: 10/27/2018 Target Resolution Date: 01/22/2019 Goal Status: Active Interventions: Assess fall risk on admission and as needed Notes: Orientation to the Wound Care Program Nursing Diagnoses: Knowledge deficit related to the wound healing center program Goals: Patient/caregiver will verbalize understanding of the [facility] Program Date Initiated: 10/27/2018 Target Resolution Date: 01/22/2019 Goal Status: Active Interventions: Provide education on orientation to the wound center Notes: Wound/Skin Impairment Nursing Diagnoses: Impaired tissue integrity Goals: Ulcer/skin breakdown will heal within 14 weeks Date Initiated: 10/27/2018 Target Resolution Date: 01/22/2019 Goal Status: Active Interventions: Assess patient/caregiver ability to obtain necessary supplies EVO, ADERMAN ([id number]) Assess patient/caregiver ability to perform ulcer/skin care regimen upon  admission and as needed Assess ulceration(s) every visit Notes: Electronic Signature(s) Signed: 11/17/2018 6:00:27 PM By: Gretta Cool, BSN, RN, CWS, Kim RN, BSN Entered By: Gretta Cool, BSN, RN, CWS, Kim on 11/17/2018 14:43:30 Rickey Sherman ([id number]) -------------------------------------------------------------------------------- Pain Assessment Details Patient Name: Rickey Sherman, Rickey Sherman. Date of Service: 11/17/2018 1:30 PM Medical Record Number: [id number] Patient Account Number: [id number] Date of Birth/Sex: Jun 03, 1946 (73 y.o. M) Treating RN: Primary Care Euphemia Lingerfelt: Harrel Lemon Other Clinician: Referring Kenlie Seki: Harrel Lemon Treating Kiaya Haliburton/Extender: Tito Dine in Treatment: 3 Active Problems Location of Pain Severity and Description of Pain Patient Has Paino No Site Locations Pain Management and Medication Current Pain Management: Electronic Signature(s) Signed: 11/17/2018 3:53:44 PM By: Lorine Bears RCP, RRT, CHT Entered By: Lorine Bears on 11/17/2018 13:32:03 Rickey Sherman ([id number]) -------------------------------------------------------------------------------- Patient/Caregiver Education Details Patient Name: Rickey Sherman, Rickey Sherman. Date of Service: 11/17/2018 1:30 PM Medical Record Number: [id number] Patient Account Number: [id number] Date of Birth/Gender: June 03, 1946 (73 y.o. M) Treating RN: Cornell Barman Primary Care Physician: Harrel Lemon Other Clinician: Referring Physician: Harrel Lemon Treating Physician/Extender: Tito Dine in Treatment: 3 Education Assessment Education Provided To: Patient Education Topics Provided Wound/Skin Impairment: Handouts: Caring for Your Ulcer  Methods: Demonstration, Explain/Verbal Responses: State content correctly Electronic Signature(s) Signed: 11/17/2018 6:00:27 PM By: Gretta Cool, BSN, RN, CWS, Kim RN, BSN Entered By: Gretta Cool, BSN, RN, CWS, Kim on 11/17/2018 14:45:19 Rickey Sherman  ([id number]) -------------------------------------------------------------------------------- Wound Assessment Details Patient Name: Rickey Sherman, Rickey Sherman. Date of Service: 11/17/2018 1:30 PM Medical Record Number: [id number] Patient Account Number: [id number] Date of Birth/Sex: 12/23/1945 (73 y.o. M) Treating RN: Harold Barban Primary Care Renu Asby: Harrel Lemon Other Clinician: Referring Kambrie Eddleman: Harrel Lemon Treating Eddis Pingleton/Extender: Tito Dine in Treatment: 3 Wound Status Wound Number: 1 Primary Diabetic Wound/Ulcer of the Lower Extremity Etiology: Wound Location: Left Lower Leg - Medial Wound Status: Open Wounding Event: Surgical Injury Comorbid Coronary Artery Disease, Hypertension, Type Date Acquired: 08/31/2018 History: II Diabetes, Gout Weeks Of Treatment: 3 Clustered Wound: No Photos Photo Uploaded By: Harold Barban on 11/17/2018 17:13:03 Wound Measurements Length: (cm) 0.9 Width: (cm) 0.7 Depth: (cm) 0.2 Area: (cm) 0.495 Volume: (cm) 0.099 % Reduction in Area: 53.3% % Reduction in Volume: 76.7% Epithelialization: None Tunneling: No Undermining: No Wound Description Classification: Grade 2 Wound Margin: Flat and Intact Exudate Amount: Medium Exudate Type: Serous Exudate Color: amber Foul Odor After Cleansing: No Slough/Fibrino Yes Wound Bed Granulation Amount: Small (1-33%) Exposed Structure Granulation Quality: Pale Fascia Exposed: No Necrotic Amount: Large (67-100%) Fat Layer (Subcutaneous Tissue) Exposed: Yes Necrotic Quality: Adherent Slough Tendon Exposed: No Muscle Exposed: No Joint Exposed: No Bone Exposed: No Periwound Skin Texture Rickey Sherman, Rickey R. ([id number]) Texture Color No Abnormalities Noted: No No Abnormalities Noted: No Callus: No Atrophie Blanche: No Crepitus: No Cyanosis: No Excoriation: No Ecchymosis: No Induration: No Erythema: No Rash: No Hemosiderin Staining: No Scarring: No Mottled: No Pallor:  No Moisture Rubor: No No Abnormalities Noted: No Dry / Scaly: Yes Temperature / Pain Maceration: No Temperature: No Abnormality Tenderness on Palpation: Yes Wound Preparation Ulcer Cleansing: Rinsed/Irrigated with Saline, Other: soap and water, Topical Anesthetic Applied: Other: lidocaine 4%, Electronic Signature(s) Signed: 12/27/2018 10:53:34 AM By: Harold Barban Entered By: Harold Barban on 11/17/2018 14:00:07 Rickey Sherman ([id number]) -------------------------------------------------------------------------------- Vitals Details Patient Name: Rickey Sherman, Rickey Sherman. Date of Service: 11/17/2018 1:30 PM Medical Record Number: [id number] Patient Account Number: [id number] Date of Birth/Sex: [birth year] (73 y.o. M) Treating RN: Primary Care Dareth Andrew: Harrel Lemon Other Clinician: Referring Mckoy Bhakta: Harrel Lemon Treating Jadie Comas/Extender: Tito Dine in Treatment: 3 Vital Signs Time Taken: 13:32 Temperature (F): 98.2 Height (in): 69 Pulse (bpm): 66 Weight (lbs): 166 Respiratory Rate (breaths/min): 16 Body Mass Index (BMI): 24.5 Blood Pressure (mmHg): 127/54 Reference Range: 80 - 120 mg / dl Electronic Signature(s) Signed: 11/17/2018 3:53:44 PM By: Lorine Bears RCP, RRT, CHT Entered By: Lorine Bears on 11/17/2018 13:34:47

## 2019-01-03 ENCOUNTER — Telehealth: Payer: Self-pay

## 2019-03-18 IMAGING — XA DG INJECT/[PERSON_NAME] INC NEEDLE/CATH/PLC EPI/CERV/THOR W/IMG
2 series · 2 of 2 positions shown · non-contrast
Comparison: none

CLINICAL DATA: Recurrent cervical radiculopathy. Displacement of
the C6-7 cervical disc. Patient reports significant relief of pain
from the previous injection for approximately 30 days. He now
reports [DATE] pain.

[Series 1: ortho standard · 1 of 1 slices shown (1 of 2)]
[im 1/1]
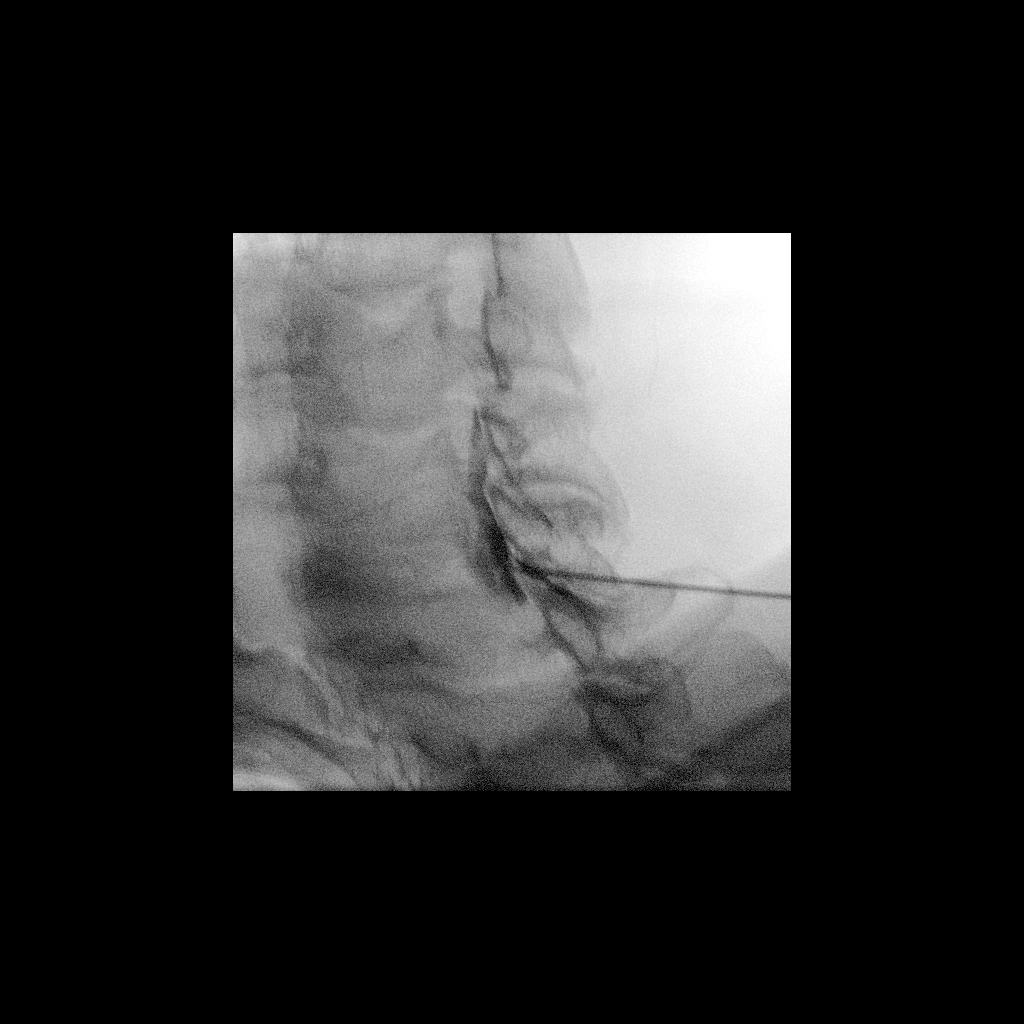

[Series 2: ortho standard · 1 of 1 slices shown (2 of 2)]
[im 1/1]
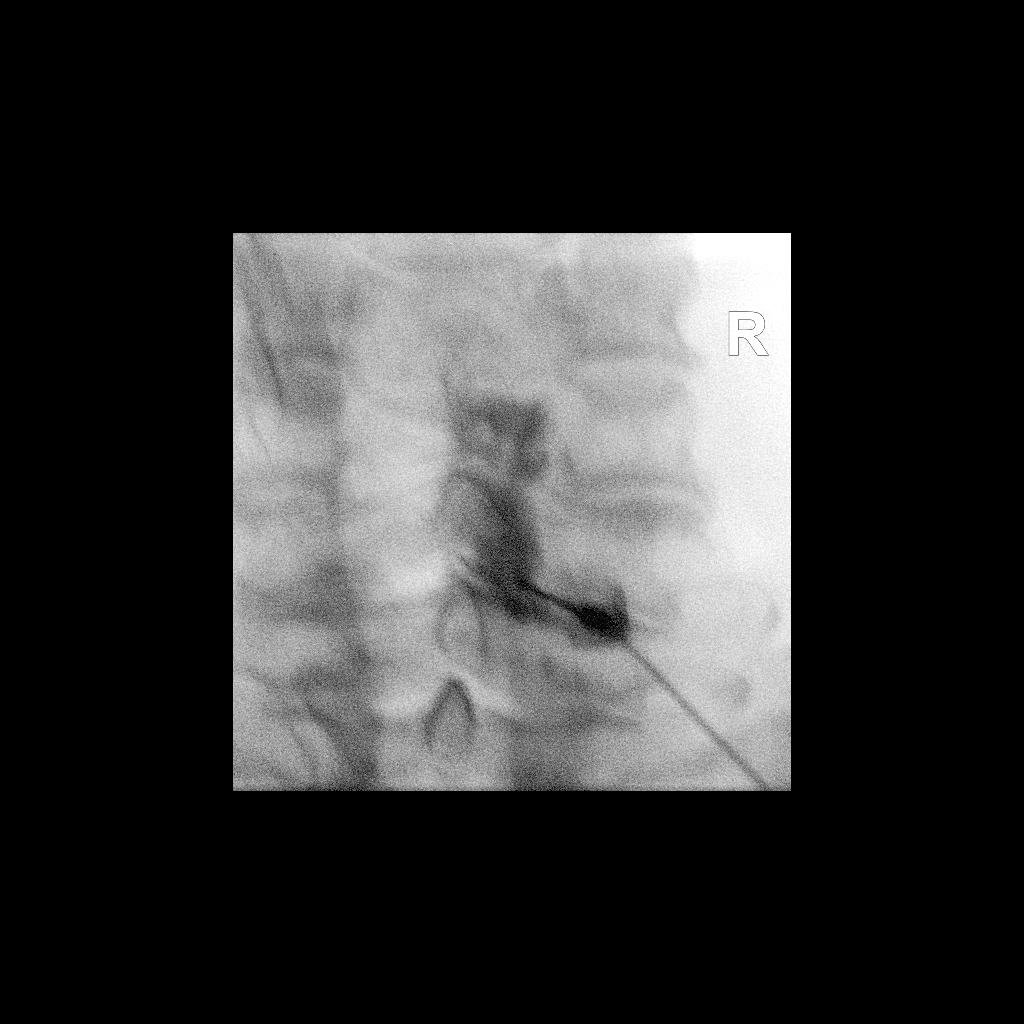

[2 of 2 positions shown; findings below may reference images not displayed]

FLUOROSCOPY TIME:  Radiation Exposure Index (as provided by the
fluoroscopic device): 6.06 uGy*m2

Fluoroscopy Time:  19 seconds

Number of Acquired Images:  0

PROCEDURE:
CERVICAL EPIDURAL INJECTION

An interlaminar approach was performed on the right at C7-T1 . A 20
gauge epidural needle was advanced using loss-of-resistance
technique.

DIAGNOSTIC EPIDURAL INJECTION

Injection of Isovue-M 300 shows a good epidural pattern with spread
above and below the level of needle placement, primarily on the
right. No vascular opacification is seen. THERAPEUTIC

EPIDURAL INJECTION

1.5 ml of Kenalog 40 mixed with 1 ml of 1% Lidocaine and 2 ml of
normal saline were then instilled. The procedure was well-tolerated,
and the patient was discharged thirty minutes following the
injection in good condition.
IMPRESSION: Technically successful first epidural injection on the right at
C7-T1.

## 2019-03-18 NOTE — Telephone Encounter (Signed)
Opened by mistake.

## 2019-11-17 ENCOUNTER — Ambulatory Visit: Payer: Medicare Other

## 2019-11-25 ENCOUNTER — Ambulatory Visit: Payer: Medicare Other | Attending: Internal Medicine

## 2019-11-25 DIAGNOSIS — Z23 Encounter for immunization: Secondary | ICD-10-CM

## 2019-11-25 NOTE — Progress Notes (Signed)
   Covid-19 Vaccination Clinic  Name:  RAMZEY FEDAK    MRN: [id number] DOB: 12/07/1945  11/25/2019  Mr. Krell was observed post Covid-19 immunization for 15 minutes without incidence. He was provided with Vaccine Information Sheet and instruction to access the V-Safe system.   Mr. Heyd was instructed to call 911 with any severe reactions post vaccine: Marland Kitchen Difficulty breathing  . Swelling of your face and throat  . A fast heartbeat  . A bad rash all over your body  . Dizziness and weakness    Immunizations Administered    Name Date Dose VIS Date Route   Pfizer COVID-19 Vaccine 11/25/2019  9:28 AM 0.3 mL 09/30/2019 Intramuscular   Manufacturer: [place]   Lot: [id number]   [place]: [id number]

## 2019-11-28 ENCOUNTER — Ambulatory Visit: Payer: Medicare Other

## 2019-12-20 ENCOUNTER — Ambulatory Visit: Payer: Medicare Other | Attending: Internal Medicine

## 2019-12-20 DIAGNOSIS — Z23 Encounter for immunization: Secondary | ICD-10-CM | POA: Insufficient documentation

## 2019-12-20 NOTE — Progress Notes (Signed)
   Covid-19 Vaccination Clinic  Name:  Rickey Sherman    MRN: [id number] DOB: [birth year]  12/20/2019  [name] was observed post Covid-19 immunization for 15 minutes without incident. He was provided with Vaccine Information Sheet and instruction to access the V-Safe system.   [name] was instructed to call 911 with any severe reactions post vaccine: Marland Kitchen Difficulty breathing  . Swelling of face and throat  . A fast heartbeat  . A bad rash all over body  . Dizziness and weakness   Immunizations Administered    Name Date Dose VIS Date Route   Pfizer COVID-19 Vaccine 12/20/2019 10:09 AM 0.3 mL 09/30/2019 Intramuscular   Manufacturer: [place]   Lot: [id number]   [place]: [id number]

## 2019-12-31 ENCOUNTER — Other Ambulatory Visit: Payer: Self-pay

## 2019-12-31 ENCOUNTER — Observation Stay
Admission: EM | Admit: 2019-12-31 | Discharge: 2020-01-01 | Disposition: A | Discharge status: Home / Self Care | Payer: Medicare Other | Attending: Internal Medicine | Admitting: Internal Medicine

## 2019-12-31 ENCOUNTER — Emergency Department: Payer: Medicare Other

## 2019-12-31 DIAGNOSIS — Z8249 Family history of ischemic heart disease and other diseases of the circulatory system: Secondary | ICD-10-CM | POA: Insufficient documentation

## 2019-12-31 DIAGNOSIS — I10 Essential (primary) hypertension: Secondary | ICD-10-CM | POA: Diagnosis not present

## 2019-12-31 DIAGNOSIS — E785 Hyperlipidemia, unspecified: Secondary | ICD-10-CM | POA: Diagnosis not present

## 2019-12-31 DIAGNOSIS — R079 Chest pain, unspecified: Secondary | ICD-10-CM | POA: Diagnosis present

## 2019-12-31 DIAGNOSIS — Z951 Presence of aortocoronary bypass graft: Secondary | ICD-10-CM | POA: Insufficient documentation

## 2019-12-31 DIAGNOSIS — Z20822 Contact with and (suspected) exposure to covid-19: Secondary | ICD-10-CM | POA: Diagnosis not present

## 2019-12-31 DIAGNOSIS — D510 Vitamin B12 deficiency anemia due to intrinsic factor deficiency: Secondary | ICD-10-CM | POA: Diagnosis not present

## 2019-12-31 DIAGNOSIS — E1110 Type 2 diabetes mellitus with ketoacidosis without coma: Secondary | ICD-10-CM | POA: Diagnosis not present

## 2019-12-31 DIAGNOSIS — I252 Old myocardial infarction: Secondary | ICD-10-CM | POA: Insufficient documentation

## 2019-12-31 DIAGNOSIS — Z7984 Long term (current) use of oral hypoglycemic drugs: Secondary | ICD-10-CM | POA: Insufficient documentation

## 2019-12-31 DIAGNOSIS — I25119 Atherosclerotic heart disease of native coronary artery with unspecified angina pectoris: Principal | ICD-10-CM | POA: Insufficient documentation

## 2019-12-31 DIAGNOSIS — K219 Gastro-esophageal reflux disease without esophagitis: Secondary | ICD-10-CM | POA: Diagnosis not present

## 2019-12-31 DIAGNOSIS — R195 Other fecal abnormalities: Secondary | ICD-10-CM | POA: Insufficient documentation

## 2019-12-31 DIAGNOSIS — M109 Gout, unspecified: Secondary | ICD-10-CM | POA: Insufficient documentation

## 2019-12-31 DIAGNOSIS — D6949 Other primary thrombocytopenia: Secondary | ICD-10-CM | POA: Diagnosis not present

## 2019-12-31 DIAGNOSIS — Z79899 Other long term (current) drug therapy: Secondary | ICD-10-CM | POA: Insufficient documentation

## 2019-12-31 DIAGNOSIS — E1169 Type 2 diabetes mellitus with other specified complication: Secondary | ICD-10-CM | POA: Diagnosis present

## 2019-12-31 DIAGNOSIS — Z85828 Personal history of other malignant neoplasm of skin: Secondary | ICD-10-CM | POA: Diagnosis not present

## 2019-12-31 DIAGNOSIS — Z7982 Long term (current) use of aspirin: Secondary | ICD-10-CM | POA: Diagnosis not present

## 2019-12-31 DIAGNOSIS — E119 Type 2 diabetes mellitus without complications: Secondary | ICD-10-CM | POA: Insufficient documentation

## 2019-12-31 LAB — CBC WITH DIFFERENTIAL/PLATELET
Abs Immature Granulocytes: 0.02 10*3/uL (ref 0.00–0.07)
Basophils Absolute: 0 10*3/uL (ref 0.0–0.1)
Basophils Relative: 0 %
Eosinophils Absolute: 0.3 10*3/uL (ref 0.0–0.5)
Eosinophils Relative: 4 %
HCT: 42.9 % (ref 39.0–52.0)
Hemoglobin: 14.2 g/dL (ref 13.0–17.0)
Immature Granulocytes: 0 %
Lymphocytes Relative: 34 %
Lymphs Abs: 2.1 10*3/uL (ref 0.7–4.0)
MCH: 30 pg (ref 26.0–34.0)
MCHC: 33.1 g/dL (ref 30.0–36.0)
MCV: 90.7 fL (ref 80.0–100.0)
Monocytes Absolute: 0.5 10*3/uL (ref 0.1–1.0)
Monocytes Relative: 7 %
Neutro Abs: 3.3 10*3/uL (ref 1.7–7.7)
Neutrophils Relative %: 55 %
Platelets: 113 10*3/uL — ABNORMAL LOW (ref 150–400)
RBC: 4.73 MIL/uL (ref 4.22–5.81)
RDW: 14.2 % (ref 11.5–15.5)
WBC: 6.1 10*3/uL (ref 4.0–10.5)
nRBC: 0 % (ref 0.0–0.2)

## 2019-12-31 MED ORDER — NITROGLYCERIN 0.4 MG SL SUBL: 0.4000 mg | SUBLINGUAL_TABLET | SUBLINGUAL | Status: DC | PRN

## 2019-12-31 NOTE — ED Triage Notes (Signed)
Pt to ED via EMS from home. Pt arrivews c/o chest pain x1week, central, pressure like pain. Pt denies sob, dizziness, or nausea. Pt has extensive cardiac hx, sees dr. Jerrye Beavers.

## 2019-12-31 NOTE — ED Provider Notes (Signed)
[facility] Emergency Department Provider Note   ____________________________________________   First MD Initiated Contact with Patient 12/31/19 2320     (approximate)  I have reviewed the triage vital signs and the nursing notes.   HISTORY  Chief Complaint Chest Pain    HPI Rickey Sherman is a 74 y.o. male with possible history of CAD status post CABG, hypertension, diabetes, and hyperlipidemia who presents to the ED complaining of chest pain.  Patient reports he has had intermittent pressure in the center of his chest over the past week, which he particularly notices when he is out playing golf.  Over the past 24 hours, pain has become more constant.  He describes it as a pressure that is relatively mild, but has made it difficult for him to sleep.  He has not had any fevers, cough, shortness of breath, pain or swelling in his legs.  He underwent CABG about a year and a half ago at [facility] and had been doing well since then, typically follows with Dr. Clayborn Bigness of cardiology.        Past Medical History:  Diagnosis Date  . Cancer ([place])    basal cell carcinoma  . Coronary artery disease   . Diabetes mellitus without complication ([place])   . Hypertension     Patient Active Problem List   Diagnosis Date Noted  . DM ketoacidosis type II ([place]) 01/01/2020  . Chest pain in adult 01/01/2020  . Chest pain 01/01/2020  . Hypertension   . Diabetes mellitus without complication ([place])   . Coronary artery disease   . Cancer ([place])   . 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 Coronary artery disease involving native coronary artery of native heart with angina pectoris ([place]) 08/18/2018  . HLD (hyperlipidemia) 08/18/2018  . Cervical disc disease 08/24/2017  . 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 Pernicious anemia 12/15/2016  . Hyperuricemia 12/11/2016  . Type 2 diabetes mellitus with hyperlipidemia ([place]) 12/11/2015  . Type II or unspecified type diabetes mellitus without  mention of complication, not stated as uncontrolled 12/18/2010  . Essential hypertension 03/29/2009  . Gout 02/19/2009  . Gouty arthropathy 02/19/2009  . Esophageal reflux 01/29/2009  . Allergic rhinitis 01/04/2008    Past Surgical History:  Procedure Laterality Date  . CORONARY ARTERY BYPASS GRAFT    . EYE SURGERY    . LEFT HEART CATH AND CORONARY ANGIOGRAPHY Left 08/18/2018   Procedure: LEFT HEART CATH AND CORONARY ANGIOGRAPHY;  Surgeon: Yolonda Kida, MD;  Location: [facility];  Service: Cardiovascular;  Laterality: Left;  . SKIN CANCER EXCISION      Prior to Admission medications   Medication Sig Start Date End Date Taking? Authorizing Provider  aspirin EC 81 MG tablet Take 81 mg by mouth every evening.   Yes [provider]  atorvastatin (LIPITOR) 80 MG tablet Take 80 mg by mouth daily.   Yes [provider]  B-D ULTRA-FINE 33 LANCETS MISC Inject into the skin. 12/14/17  Yes [provider]  Blood Glucose Monitoring Suppl (ONE TOUCH ULTRA 2) w/Device KIT Use as instructed. 03/02/15  Yes [provider]  empagliflozin (JARDIANCE) 25 MG TABS tablet Take by mouth. 10/21/18  Yes [provider]  glipiZIDE (GLUCOTROL) 10 MG tablet TAKE 1 TABLET BY MOUTH TWO  TIMES DAILY BEFORE MEALS 06/07/18  Yes [provider]  glucose blood (ONE TOUCH ULTRA TEST) test strip Use once daily Use as instructed. 04/01/18  Yes [provider]  Lancets (ONETOUCH DELICA PLUS [id number]) [place]  08/14/18  Yes [provider]  metFORMIN (GLUCOPHAGE) 1000 MG tablet Take 1,000 mg by mouth 2 (two) times daily.   Yes [provider]  metoprolol tartrate (LOPRESSOR) 25 MG tablet Take 25 mg by mouth 2 (two) times daily.  10/04/18 01/01/20 Yes [provider]  omeprazole (PRILOSEC) 40 MG capsule TAKE 1 CAPSULE BY MOUTH  ONCE A DAY 06/07/18  Yes [provider]  tamsulosin (FLOMAX) 0.4 MG CAPS capsule Take 0.4 mg by  mouth.   Yes [provider]  vitamin B-12 (CYANOCOBALAMIN) 1000 MCG tablet Take by mouth.   Yes [provider]  acetaminophen (TYLENOL) 500 MG tablet Take 1,000 mg by mouth daily as needed for moderate pain or headache.    [provider]  allopurinol (ZYLOPRIM) 100 MG tablet Take 100 mg by mouth every evening.    [provider]  allopurinol (ZYLOPRIM) 100 MG tablet TAKE 1 TABLET BY MOUTH ONCE DAILY 06/07/18   [provider]  fluorouracil (EFUDEX) 5 % cream Apply 1 application topically daily as needed (skin spots).  07/19/18   [provider]  ibuprofen (ADVIL,MOTRIN) 200 MG tablet Take 400 mg by mouth daily as needed for moderate pain.    [provider]  isosorbide mononitrate (IMDUR) 30 MG 24 hr tablet Take 30 mg by mouth daily.    [provider]  isosorbide mononitrate (IMDUR) 30 MG 24 hr tablet Take by mouth. 08/16/18 08/16/19  [provider]  losartan (COZAAR) 100 MG tablet Take 100 mg by mouth daily.    [provider]  losartan (COZAAR) 100 MG tablet TAKE 1 TABLET BY MOUTH ONCE DAILY 06/07/18   [provider]  metoprolol succinate (TOPROL-XL) 25 MG 24 hr tablet Take by mouth. 08/16/18 08/16/19  [provider]  pioglitazone (ACTOS) 15 MG tablet Take 15 mg by mouth every evening.    [provider]  pioglitazone (ACTOS) 15 MG tablet Take by mouth. 04/15/18 04/15/19  [provider]    Allergies Patient has no known allergies.  Family History  Problem Relation Age of Onset  . Congestive Heart Failure Mother   . Heart failure Father     Social History Social History   Tobacco Use  . Smoking status: Never Smoker  . Smokeless tobacco: Never Used  Substance Use Topics  . Alcohol use: Yes    Alcohol/week: 1.0 standard drinks    Types: 1 Glasses of wine per week  . Drug use: Never    Review of Systems  Constitutional: No fever/chills Eyes: No visual  changes. ENT: No sore throat. Cardiovascular: Positive for chest pain. Respiratory: Denies shortness of breath. Gastrointestinal: No abdominal pain.  No nausea, no vomiting.  No diarrhea.  No constipation. Genitourinary: Negative for dysuria. Musculoskeletal: Negative for back pain. Skin: Negative for rash. Neurological: Negative for headaches, focal weakness or numbness.  ____________________________________________   PHYSICAL EXAM:  VITAL SIGNS: ED Triage Vitals  Enc Vitals Group     BP      Pulse      Resp      Temp      Temp src      SpO2      Weight      Height      Head Circumference      Peak Flow      Pain Score      Pain Loc      Pain Edu?  Excl. in [place]?     Constitutional: Alert and oriented. Eyes: Conjunctivae are normal. Head: Atraumatic. Nose: No congestion/rhinnorhea. Mouth/Throat: Mucous membranes are moist. Neck: Normal ROM Cardiovascular: Normal rate, regular rhythm. Grossly normal heart sounds.  2+ radial pulses bilaterally. Respiratory: Normal respiratory effort.  No retractions. Lungs CTAB. Gastrointestinal: Soft and nontender. No distention. Genitourinary: deferred Musculoskeletal: No lower extremity tenderness nor edema. Neurologic:  Normal speech and language. No gross focal neurologic deficits are appreciated. Skin:  Skin is warm, dry and intact. No rash noted. Psychiatric: Mood and affect are normal. Speech and behavior are normal.  ____________________________________________   LABS (all labs ordered are listed, but only abnormal results are displayed)  Labs Reviewed  CBC WITH DIFFERENTIAL/PLATELET - Abnormal; Notable for the following components:      Result Value   Platelets 113 (*)    All other components within normal limits  BASIC METABOLIC PANEL - Abnormal; Notable for the following components:   Glucose, Bld 118 (*)    All other components within normal limits  SARS CORONAVIRUS 2 (TAT 6-24 HRS)  TSH  T4, FREE   HEMOGLOBIN A1C  OCCULT BLOOD X 1 CARD TO LAB, STOOL  FIBRIN DERIVATIVES D-DIMER ([facility] ONLY)  TYPE AND SCREEN  TROPONIN I (HIGH SENSITIVITY)  TROPONIN I (HIGH SENSITIVITY)   ____________________________________________  EKG  ED ECG REPORT I, Blake Divine, the attending physician, personally viewed and interpreted this ECG.   Date: 12/31/2019  EKG Time: 23:21  Rate: 74  Rhythm: normal sinus rhythm  Axis: Normal  Intervals:none  ST&T Change: None   PROCEDURES  Procedure(s) performed (including Critical Care):  Procedures   ____________________________________________   INITIAL IMPRESSION / ASSESSMENT AND PLAN / ED COURSE       74 year old male with history of CAD status post CABG in November 2019 presents to the ED complaining of intermittent chest pressure over the past couple of days, has been present more constantly over the past 24 hours.  He describes it as relatively mild at this time and we will trial nitroglycerin to see if this helps.  EKG shows no acute ischemic changes, however patient is quite high risk given his history and describes symptoms are concerning for ACS.  Plan to check chest x-ray and serial troponins, anticipate admission for high risk chest pain work-up.  Initial troponin is negative and remainder of labs are unremarkable.  Chest x-ray negative for acute process.  Patient has declined nitroglycerin and states pain has now resolved.  Given concerning symptoms and history, case was discussed with hospitalist, who accepts patient for admission.      ____________________________________________   FINAL CLINICAL IMPRESSION(S) / ED DIAGNOSES  Final diagnoses:  Nonspecific chest pain  S/P CABG (coronary artery bypass graft)     ED Discharge Orders    None       Note:  This document was prepared using Dragon voice recognition software and may include unintentional dictation errors.   Blake Divine, MD 01/01/20 [phone]

## 2020-01-01 ENCOUNTER — Observation Stay
Admit: 2020-01-01 | Discharge: 2020-01-01 | Disposition: A | Discharge status: Home / Self Care | Payer: Medicare Other | Attending: Internal Medicine | Admitting: Internal Medicine

## 2020-01-01 ENCOUNTER — Encounter: Payer: Self-pay | Admitting: Internal Medicine

## 2020-01-01 DIAGNOSIS — E119 Type 2 diabetes mellitus without complications: Secondary | ICD-10-CM | POA: Insufficient documentation

## 2020-01-01 DIAGNOSIS — R079 Chest pain, unspecified: Secondary | ICD-10-CM

## 2020-01-01 DIAGNOSIS — E1110 Type 2 diabetes mellitus with ketoacidosis without coma: Secondary | ICD-10-CM | POA: Insufficient documentation

## 2020-01-01 DIAGNOSIS — E785 Hyperlipidemia, unspecified: Secondary | ICD-10-CM

## 2020-01-01 DIAGNOSIS — E1169 Type 2 diabetes mellitus with other specified complication: Secondary | ICD-10-CM

## 2020-01-01 DIAGNOSIS — I10 Essential (primary) hypertension: Secondary | ICD-10-CM | POA: Insufficient documentation

## 2020-01-01 DIAGNOSIS — Z951 Presence of aortocoronary bypass graft: Secondary | ICD-10-CM

## 2020-01-01 DIAGNOSIS — I2510 Atherosclerotic heart disease of native coronary artery without angina pectoris: Secondary | ICD-10-CM | POA: Insufficient documentation

## 2020-01-01 DIAGNOSIS — C801 Malignant (primary) neoplasm, unspecified: Secondary | ICD-10-CM | POA: Insufficient documentation

## 2020-01-01 LAB — TSH: TSH: 3.315 u[IU]/mL (ref 0.350–4.500)

## 2020-01-01 LAB — T4, FREE: Free T4: 0.94 ng/dL (ref 0.61–1.12)

## 2020-01-01 LAB — HEMOGLOBIN A1C
Hgb A1c MFr Bld: 7 % — ABNORMAL HIGH (ref 4.8–5.6)
Mean Plasma Glucose: 154.2 mg/dL

## 2020-01-01 LAB — BASIC METABOLIC PANEL
Anion gap: 12 (ref 5–15)
BUN: 23 mg/dL (ref 8–23)
CO2: 22 mmol/L (ref 22–32)
Calcium: 9.3 mg/dL (ref 8.9–10.3)
Chloride: 107 mmol/L (ref 98–111)
Creatinine, Ser: 1.13 mg/dL (ref 0.61–1.24)
GFR calc Af Amer: >60 mL/min (ref >60)
GFR calc non Af Amer: >60 mL/min (ref >60)
Glucose, Bld: 118 mg/dL — ABNORMAL HIGH (ref 70–99)
Potassium: 4.2 mmol/L (ref 3.5–5.1)
Sodium: 141 mmol/L (ref 135–145)

## 2020-01-01 LAB — TYPE AND SCREEN
ABO/RH(D): A POS
Antibody Screen: NEGATIVE

## 2020-01-01 LAB — GLUCOSE, CAPILLARY
Glucose-Capillary: 124 mg/dL — ABNORMAL HIGH (ref 70–99)
Glucose-Capillary: 154 mg/dL — ABNORMAL HIGH (ref 70–99)

## 2020-01-01 LAB — SARS CORONAVIRUS 2 (TAT 6-24 HRS): SARS Coronavirus 2: NEGATIVE

## 2020-01-01 LAB — FIBRIN DERIVATIVES D-DIMER (ARMC ONLY): Fibrin derivatives D-dimer (ARMC): 426.04 ng/mL (FEU) (ref 0.00–499.00)

## 2020-01-01 LAB — TROPONIN I (HIGH SENSITIVITY)
Troponin I (High Sensitivity): 3 ng/L (ref <18)
Troponin I (High Sensitivity): 3 ng/L (ref <18)

## 2020-01-01 LAB — ECHOCARDIOGRAM COMPLETE
Height: 70 in
Weight: 2624 oz

## 2020-01-01 MED ORDER — ASPIRIN 300 MG RE SUPP: 300.0000 mg | RECTAL | Status: DC

## 2020-01-01 MED ORDER — LOSARTAN POTASSIUM 50 MG PO TABS
100.0000 mg | ORAL_TABLET | Freq: Every day | ORAL | Status: DC
  Administered 2020-01-01: 100 mg via ORAL
  Filled 2020-01-01: qty 2

## 2020-01-01 MED ORDER — INSULIN ASPART 100 UNIT/ML ~~LOC~~ SOLN
0.0000 [IU] | Freq: Three times a day (TID) | SUBCUTANEOUS | Status: DC
  Administered 2020-01-01: 08:00:00 1 [IU] via SUBCUTANEOUS
  Filled 2020-01-01: qty 1

## 2020-01-01 MED ORDER — ALLOPURINOL 100 MG PO TABS: 100.0000 mg | ORAL_TABLET | Freq: Every evening | ORAL | Status: DC

## 2020-01-01 MED ORDER — INSULIN ASPART 100 UNIT/ML ~~LOC~~ SOLN: 0.0000 [IU] | Freq: Every day | SUBCUTANEOUS | Status: DC

## 2020-01-01 MED ORDER — NITROGLYCERIN 0.4 MG SL SUBL: 0.4000 mg | SUBLINGUAL_TABLET | SUBLINGUAL | Status: DC | PRN

## 2020-01-01 MED ORDER — ASPIRIN 81 MG PO CHEW: 324.0000 mg | CHEWABLE_TABLET | ORAL | Status: DC

## 2020-01-01 MED ORDER — ONDANSETRON HCL 4 MG/2ML IJ SOLN: 4.0000 mg | Freq: Four times a day (QID) | INTRAMUSCULAR | Status: DC | PRN

## 2020-01-01 MED ORDER — ASPIRIN EC 81 MG PO TBEC: 81.0000 mg | DELAYED_RELEASE_TABLET | Freq: Every day | ORAL | Status: DC

## 2020-01-01 MED ORDER — NITROGLYCERIN 0.4 MG SL SUBL: 0.4000 mg | SUBLINGUAL_TABLET | SUBLINGUAL | 0 refills | Status: AC | PRN

## 2020-01-01 MED ORDER — METOPROLOL SUCCINATE ER 25 MG PO TB24: 25.0000 mg | ORAL_TABLET | Freq: Every day | ORAL | 0 refills | Status: AC

## 2020-01-01 MED ORDER — SODIUM CHLORIDE 0.9 % IV SOLN: INTRAVENOUS | Status: DC

## 2020-01-01 MED ORDER — ISOSORBIDE MONONITRATE ER 60 MG PO TB24
30.0000 mg | ORAL_TABLET | Freq: Every day | ORAL | Status: DC
  Filled 2020-01-01: qty 1

## 2020-01-01 MED ORDER — HEPARIN SODIUM (PORCINE) 5000 UNIT/ML IJ SOLN
5000.0000 [IU] | Freq: Three times a day (TID) | INTRAMUSCULAR | Status: DC
  Administered 2020-01-01: 5000 [IU] via SUBCUTANEOUS
  Filled 2020-01-01: qty 1

## 2020-01-01 MED ORDER — METOPROLOL SUCCINATE ER 50 MG PO TB24
25.0000 mg | ORAL_TABLET | Freq: Every day | ORAL | Status: DC
  Filled 2020-01-01: qty 1

## 2020-01-01 MED ORDER — ACETAMINOPHEN 325 MG PO TABS: 650.0000 mg | ORAL_TABLET | ORAL | Status: DC | PRN

## 2020-01-01 MED ORDER — ATORVASTATIN CALCIUM 20 MG PO TABS: 80.0000 mg | ORAL_TABLET | Freq: Every day | ORAL | Status: DC

## 2020-01-01 MED ORDER — ASPIRIN EC 81 MG PO TBEC: 81.0000 mg | DELAYED_RELEASE_TABLET | Freq: Every evening | ORAL | Status: DC

## 2020-01-01 MED ORDER — PANTOPRAZOLE SODIUM 40 MG PO TBEC
40.0000 mg | DELAYED_RELEASE_TABLET | Freq: Every day | ORAL | Status: DC
  Administered 2020-01-01: 40 mg via ORAL
  Filled 2020-01-01: qty 1

## 2020-01-01 NOTE — Plan of Care (Signed)

## 2020-01-01 NOTE — Discharge Summary (Signed)
Physician Discharge Summary  Rickey Sherman KGY:[id number] DOB: [birth year] DOA: 12/31/2019  PCP: Baxter Hire, MD  Admit date: 12/31/2019 Discharge date: 01/01/2020  Admitted From: home Disposition:  home  Recommendations for Outpatient Follow-up:  1. Follow up with PCP in 1-2 weeks 2. Please obtain BMP/CBC in one week 3. Please follow up with cardiology early this week 4. Echocardiogram results pending at discharge, please discuss results with patient in follow up  Home Health: no  Equipment/Devices: none   Discharge Condition: stable  CODE STATUS: full  Diet recommendation: Heart Healthy / Carb Modified   Brief/Interim Summary:  Rickey Sherman is a 74 y.o. male with medical history significant of  coronary artery disease status post MI with CABG in November 2019, hypertension, type 2 diabetes who presented to the ED with chest pain that has been ongoing for 7 days, occurring at rest but no worse with exertion, with radiation to bilateral shoulders.  Patient presented due to chest pain preventing him from falling asleep.  In the ED patient was mildly hypertensive with heart rate of 60 and afebrile.  BMP and CBC were essentially unremarkable aside from glucose 118 and platelets 113.  D-dimer negative.  TSH and free T4 within normal limits.  Troponin was trended and negative.  Chest x-ray negative for any acute cardiopulmonary process.  EKG normal sinus rhythm without acute ischemic changes.  Echocardiogram was obtained report pending at the time of discharge.  Patient reported improvement in his chest pain.  Discussed with cardiology and recommended close outpatient follow-up to discuss echo results.  Patient is hemodynamically stable and clinically improved versus discharge home today and will follow up with Dr. Clayborn Bigness this week.  Plan discussed with patient and he is agreeable.   Discharge Diagnoses: Principal Problem:   Chest pain Active Problems:   Esophageal reflux   Essential  hypertension   Type 2 diabetes mellitus with hyperlipidemia (HCC)   Coronary artery disease involving native coronary artery of native heart with angina pectoris (HCC)   S/P CABG x 4    Discharge Instructions   Discharge Instructions    Call MD for:   Complete by: As directed    Chest pain that gets worse with physical activity, or severe chest pain at rest.   Call MD for:  extreme fatigue   Complete by: As directed    Call MD for:  persistant dizziness or light-headedness   Complete by: As directed    Diet - low sodium heart healthy   Complete by: As directed    Diet Carb Modified   Complete by: As directed    Discharge instructions   Complete by: As directed    Please follow up this week with Cardiology in clinic.   Your echocardiogram report is pending, but results will be discussed at follow up appointment.   Increase activity slowly   Complete by: As directed      Allergies as of 01/01/2020   No Known Allergies     Medication List    STOP taking these medications   metoprolol tartrate 25 MG tablet Commonly known as: LOPRESSOR     TAKE these medications   acetaminophen 500 MG tablet Commonly known as: TYLENOL Take 1,000 mg by mouth daily as needed for moderate pain or headache.   allopurinol 100 MG tablet Commonly known as: ZYLOPRIM Take 100 mg by mouth every evening.   allopurinol 100 MG tablet Commonly known as: ZYLOPRIM TAKE 1 TABLET BY MOUTH ONCE DAILY  aspirin EC 81 MG tablet Take 81 mg by mouth every evening.   atorvastatin 80 MG tablet Commonly known as: LIPITOR Take 80 mg by mouth daily.   B-D ULTRA-FINE 33 LANCETS Misc Inject into the skin.   OneTouch Delica Plus [id number] Misc   empagliflozin 25 MG Tabs tablet Commonly known as: JARDIANCE Take by mouth.   fluorouracil 5 % cream Commonly known as: EFUDEX Apply 1 application topically daily as needed (skin spots).   glipiZIDE 10 MG tablet Commonly known as: GLUCOTROL TAKE 1 TABLET  BY MOUTH TWO  TIMES DAILY BEFORE MEALS   ibuprofen 200 MG tablet Commonly known as: ADVIL Take 400 mg by mouth daily as needed for moderate pain.   isosorbide mononitrate 30 MG 24 hr tablet Commonly known as: IMDUR Take 30 mg by mouth daily. What changed: Another medication with the same name was removed. Continue taking this medication, and follow the directions you see here.   losartan 100 MG tablet Commonly known as: COZAAR Take 100 mg by mouth daily. What changed: Another medication with the same name was removed. Continue taking this medication, and follow the directions you see here.   metFORMIN 1000 MG tablet Commonly known as: GLUCOPHAGE Take 1,000 mg by mouth 2 (two) times daily.   metoprolol succinate 25 MG 24 hr tablet Commonly known as: TOPROL-XL Take 1 tablet (25 mg total) by mouth daily. Take with or immediately following a meal. Start taking on: January 02, 2020 What changed:   additional instructions  Another medication with the same name was removed. Continue taking this medication, and follow the directions you see here.   nitroGLYCERIN 0.4 MG SL tablet Commonly known as: NITROSTAT Place 1 tablet (0.4 mg total) under the tongue every 5 (five) minutes as needed for chest pain.   omeprazole 40 MG capsule Commonly known as: PRILOSEC TAKE 1 CAPSULE BY MOUTH  ONCE A DAY   ONE TOUCH ULTRA 2 w/Device Kit Use as instructed.   ONE TOUCH ULTRA TEST test strip Generic drug: glucose blood Use once daily Use as instructed.   pioglitazone 15 MG tablet Commonly known as: ACTOS Take 15 mg by mouth every evening. What changed: Another medication with the same name was removed. Continue taking this medication, and follow the directions you see here.   tamsulosin 0.4 MG Caps capsule Commonly known as: FLOMAX Take 0.4 mg by mouth.   vitamin B-12 1000 MCG tablet Commonly known as: CYANOCOBALAMIN Take by mouth.      Follow-up Information    Lujean Amel D,  MD. Schedule an appointment as soon as possible for a visit in 3 day(s).   Specialties: Cardiology, Internal Medicine Why: please make appt as soon as possible for this week to discuss echo results Contact information: [place] [address] [phone]        Baxter Hire, MD Follow up in 1 week(s).   Specialty: Internal Medicine Contact information: [place] [address] [phone]          No Known Allergies  Consultations:  None   Procedures/Studies: DG Chest 2 View  Result Date: 12/31/2019 CLINICAL DATA:  Chest pain EXAM: CHEST - 2 VIEW COMPARISON:  None. FINDINGS: Postsurgical changes related to prior CABG including intact and aligned sternotomy wires and multiple surgical clips projecting over the mediastinum. The aorta is calcified. The remaining cardiomediastinal contours are unremarkable. No consolidation, features of edema, pneumothorax, or effusion. No acute osseous or soft tissue abnormality.  Degenerative changes are present in the imaged spine and shoulders. IMPRESSION: No acute cardiopulmonary abnormality. Electronically Signed   By: Lovena Le M.D.   On: 12/31/2019 23:51      Echocardiogram on 3/14 -report pending   Subjective: Patient seen at bedside.  No acute events reported overnight.  He reported very mild ongoing chest pain but improved.  No shortness of breath, diaphoresis, nausea, palpitations or other associated symptoms.  He reports he had his second Covid shot 2 weeks ago and this chest pain started approximately a week later has persisted since.  He wonders if the shot has anything to do with it.   Discharge Exam: Vitals:   01/01/20 0807 01/01/20 1143  BP: (!) 134/54 (!) 136/54  Pulse: 63 66  Resp:    Temp: 98.7 F (37.1 C) 98.8 F (37.1 C)  SpO2: 99% 97%   Vitals:   01/01/20 0100 01/01/20 0143 01/01/20 0807 01/01/20 1143  BP: (!) 144/63 (!) 152/53 (!) 134/54 (!) 136/54  Pulse: 60  62 63 66  Resp: 15 20    Temp:  98 F (36.7 C) 98.7 F (37.1 C) 98.8 F (37.1 C)  TempSrc:  Oral Oral Oral  SpO2: 99% 98% 99% 97%  Weight:  74.4 kg    Height:        General: Pt is alert, awake, not in acute distress Cardiovascular: RRR, S1/S2 +, no rubs, no gallops Respiratory: CTA bilaterally, no wheezing, no rhonchi Abdominal: Soft, NT, ND, bowel sounds + Extremities: no edema, no cyanosis    The results of significant diagnostics from this hospitalization (including imaging, microbiology, ancillary and laboratory) are listed below for reference.     Microbiology: Recent Results (from the past 240 hour(s))  SARS CORONAVIRUS 2 (TAT 6-24 HRS) Nasopharyngeal Nasopharyngeal Swab     Status: None   Collection Time: 01/01/20 12:51 AM   Specimen: Nasopharyngeal Swab  Result Value Ref Range Status   SARS Coronavirus 2 NEGATIVE NEGATIVE Final    Comment: (NOTE) SARS-CoV-2 target nucleic acids are NOT DETECTED. The SARS-CoV-2 RNA is generally detectable in upper and lower respiratory specimens during the acute phase of infection. Negative results do not preclude SARS-CoV-2 infection, do not rule out co-infections with other pathogens, and should not be used as the sole basis for treatment or other patient management decisions. Negative results must be combined with clinical observations, patient history, and epidemiological information. The expected result is Negative. Fact Sheet for Patients: SugarRoll.be Fact Sheet for Healthcare Providers: https://www.woods-mathews.com/ This test is not yet approved or cleared by the [place] FDA and  has been authorized for detection and/or diagnosis of SARS-CoV-2 by FDA under an Emergency Use Authorization (EUA). This EUA will remain  in effect (meaning this test can be used) for the duration of the COVID-19 declaration under Section 56 4(b)(1) of the Act, 21 U.S.C. section 360bbb-3(b)(1),  unless the authorization is terminated or revoked sooner. Performed at [facility], [place] [address] [place], [place] [address]      Labs: BNP (last 3 results) No results for input(s): BNP in the last 8760 hours. Basic Metabolic Panel: Recent Labs  Lab 12/31/19 2329  NA 141  K 4.2  CL 107  CO2 22  GLUCOSE 118*  BUN 23  CREATININE 1.13  CALCIUM 9.3   Liver Function Tests: No results for input(s): AST, ALT, ALKPHOS, BILITOT, PROT, ALBUMIN in the last 168 hours. No results for input(s): LIPASE, AMYLASE in the last 168 hours. No  results for input(s): AMMONIA in the last 168 hours. CBC: Recent Labs  Lab 12/31/19 2329  WBC 6.1  NEUTROABS 3.3  HGB 14.2  HCT 42.9  MCV 90.7  PLT 113*   Cardiac Enzymes: No results for input(s): CKTOTAL, CKMB, CKMBINDEX, TROPONINI in the last 168 hours. BNP: Invalid input(s): POCBNP CBG: Recent Labs  Lab 01/01/20 0808 01/01/20 1144  GLUCAP 124* 154*   D-Dimer No results for input(s): DDIMER in the last 72 hours. Hgb A1c No results for input(s): HGBA1C in the last 72 hours. Lipid Profile No results for input(s): CHOL, HDL, LDLCALC, TRIG, CHOLHDL, LDLDIRECT in the last 72 hours. Thyroid function studies Recent Labs    01/01/20 0624  TSH 3.315   Anemia work up No results for input(s): VITAMINB12, FOLATE, FERRITIN, TIBC, IRON, RETICCTPCT in the last 72 hours. Urinalysis No results found for: COLORURINE, APPEARANCEUR, [place], [place], [place], [place], [place], [place], PROTEINUR, UROBILINOGEN, NITRITE, LEUKOCYTESUR Sepsis Labs Invalid input(s): PROCALCITONIN,  WBC,  LACTICIDVEN Microbiology Recent Results (from the past 240 hour(s))  SARS CORONAVIRUS 2 (TAT 6-24 HRS) Nasopharyngeal Nasopharyngeal Swab     Status: None   Collection Time: 01/01/20 12:51 AM   Specimen: Nasopharyngeal Swab  Result Value Ref Range Status   SARS Coronavirus 2 NEGATIVE NEGATIVE Final    Comment: (NOTE) SARS-CoV-2 target nucleic acids  are NOT DETECTED. The SARS-CoV-2 RNA is generally detectable in upper and lower respiratory specimens during the acute phase of infection. Negative results do not preclude SARS-CoV-2 infection, do not rule out co-infections with other pathogens, and should not be used as the sole basis for treatment or other patient management decisions. Negative results must be combined with clinical observations, patient history, and epidemiological information. The expected result is Negative. Fact Sheet for Patients: SugarRoll.be Fact Sheet for Healthcare Providers: https://www.woods-mathews.com/ This test is not yet approved or cleared by the [place] FDA and  has been authorized for detection and/or diagnosis of SARS-CoV-2 by FDA under an Emergency Use Authorization (EUA). This EUA will remain  in effect (meaning this test can be used) for the duration of the COVID-19 declaration under Section 56 4(b)(1) of the Act, 21 U.S.C. section 360bbb-3(b)(1), unless the authorization is terminated or revoked sooner. Performed at [facility], [place] [address], [place], [place] [address]      Time coordinating discharge: Over 30 minutes  SIGNED:   Ezekiel Slocumb, DO [facility] 01/01/2020, 11:58 AM   If 7PM-7AM, please contact night-coverage [email or web address]

## 2020-01-01 NOTE — H&P (Signed)
History and Physical    Rickey Sherman JHE:[id number] DOB: [birth year] DOA: 12/31/2019  Referring MD/NP/PA: Dr. Charna Archer PCP: Baxter Hire, MD   Outpatient Specialists: [facility]- Cardiology   Patient coming from: Home  Chief Complaint: Chest pain.  HPI: Rickey Sherman is a 74 y.o. male with medical history significant of cad/ mi/ cabg/ htn/  Dm ii/ Seen in ed for chest pain since 7 days . Located middle of chest and 3/10, dull, radiated across both sides of chest for few seconds . And it would resolve on its own. Pt has not taken any ntg at home. He has CABG in November 2019. No alleviating or aggravating factors.  No sob/ no n/v. Pt has trouble sleeping today and so come in.  Pt  2-3 /10 currently .  ED Course:  Blood pressure (!) 144/63, pulse 60, temperature 98.1 F (36.7 C), temperature source Oral, resp. rate 15, height 5' 10" (1.778 m), weight 73.5 kg, SpO2 99 %. troponin is negative.   Review of Systems: As per HPI otherwise 10 point review of systems negative.    Past Medical History:  Diagnosis Date  . Cancer ([place])    basal cell carcinoma  . Coronary artery disease   . Diabetes mellitus without complication ([place])   . Hypertension     Past Surgical History:  Procedure Laterality Date  . CORONARY ARTERY BYPASS GRAFT    . EYE SURGERY    . LEFT HEART CATH AND CORONARY ANGIOGRAPHY Left 08/18/2018   Procedure: LEFT HEART CATH AND CORONARY ANGIOGRAPHY;  Surgeon: Yolonda Kida, MD;  Location: [facility];  Service: Cardiovascular;  Laterality: Left;  . SKIN CANCER EXCISION       reports that he has never smoked. He has never used smokeless tobacco. He reports current alcohol use of about 1.0 standard drinks of alcohol per week. He reports that he does not use drugs.  No Known Allergies  Family History  Problem Relation Age of Onset  . Congestive Heart Failure Mother   . Heart failure Father      Prior to Admission medications   Medication Sig  Start Date End Date Taking? Authorizing Provider  acetaminophen (TYLENOL) 500 MG tablet Take 1,000 mg by mouth daily as needed for moderate pain or headache.    [provider]  allopurinol (ZYLOPRIM) 100 MG tablet Take 100 mg by mouth every evening.    [provider]  allopurinol (ZYLOPRIM) 100 MG tablet TAKE 1 TABLET BY MOUTH ONCE DAILY 06/07/18   [provider]  aspirin EC 81 MG tablet Take 81 mg by mouth every evening.    [provider]  B-D ULTRA-FINE 33 LANCETS MISC Inject into the skin. 12/14/17   [provider]  Blood Glucose Monitoring Suppl (ONE TOUCH ULTRA 2) w/Device KIT Use as instructed. 03/02/15   [provider]  empagliflozin (JARDIANCE) 25 MG TABS tablet Take by mouth. 10/21/18   [provider]  fluorouracil (EFUDEX) 5 % cream Apply 1 application topically daily as needed (skin spots).  07/19/18   [provider]  glipiZIDE (GLUCOTROL) 10 MG tablet TAKE 1 TABLET BY MOUTH TWO  TIMES DAILY BEFORE MEALS 06/07/18   [provider]  glucose blood (ONE TOUCH ULTRA TEST) test strip Use once daily Use as instructed. 04/01/18   [provider]  ibuprofen (ADVIL,MOTRIN) 200 MG tablet Take 400 mg by mouth daily as needed for moderate pain.    [provider]  isosorbide mononitrate (IMDUR) 30 MG 24 hr tablet Take 30 mg by mouth daily.    [provider]  isosorbide mononitrate (IMDUR) 30 MG 24 hr tablet Take by mouth. 08/16/18 08/16/19  [provider]  Lancets (ONETOUCH DELICA PLUS [id number]) [place]  08/14/18   [provider]  losartan (COZAAR) 100 MG tablet Take 100 mg by mouth daily.    [provider]  losartan (COZAAR) 100 MG tablet TAKE 1 TABLET BY MOUTH ONCE DAILY 06/07/18   [provider]  metFORMIN (GLUCOPHAGE) 1000 MG tablet Take 1,000 mg by mouth 2 (two) times daily.    [provider]  metoprolol succinate (TOPROL-XL) 25 MG 24 hr  tablet Take 25 mg by mouth daily.    [provider]  metoprolol succinate (TOPROL-XL) 25 MG 24 hr tablet Take by mouth. 08/16/18 08/16/19  [provider]  metoprolol tartrate (LOPRESSOR) 25 MG tablet Take by mouth 2 (two) times daily.  10/04/18 11/03/18  [provider]  omeprazole (PRILOSEC) 40 MG capsule TAKE 1 CAPSULE BY MOUTH  ONCE A DAY 06/07/18   [provider]  pioglitazone (ACTOS) 15 MG tablet Take 15 mg by mouth every evening.    [provider]  pioglitazone (ACTOS) 15 MG tablet Take by mouth. 04/15/18 04/15/19  [provider]  vitamin B-12 (CYANOCOBALAMIN) 1000 MCG tablet Take by mouth.    [provider]    Physical Exam: Vitals:   12/31/19 2335 01/01/20 0000 01/01/20 0030 01/01/20 0100  BP:  137/64 (!) 132/58 (!) 144/63  Pulse:  62 64 60  Resp:  _0 Temp: 98.1 F (36.7 C)     TempSrc: Oral     SpO2:  97% 98% 99%  Weight:      Height:        Constitutional: NAD, calm, comfortable Vitals:   12/31/19 2335 01/01/20 0000 01/01/20 0030 01/01/20 0100  BP:  137/64 (!) 132/58 (!) 144/63  Pulse:  62 64 60  Resp:  _1 Temp: 98.1 F (36.7 C)     TempSrc: Oral     SpO2:  97% 98% 99%  Weight:      Height:       Eyes: PERRL, lids and conjunctivae normal ENMT: Mucous membranes are moist. Posterior pharynx clear of any exudate or lesions.Normal dentition.  Neck: normal, supple, no masses, no thyromegaly Respiratory: clear to auscultation bilaterally, no wheezing, no crackles. Normal respiratory effort. No accessory muscle use.  Cardiovascular: Regular rate and rhythm, no murmurs / rubs / gallops. No extremity edema. 2+ pedal pulses. No carotid bruits.  Abdomen: no tenderness, no masses palpated. No hepatosplenomegaly. Bowel sounds positive.  Musculoskeletal: no clubbing / cyanosis. No joint deformity upper and lower extremities. Good ROM, no contractures. Normal muscle tone.  Skin: no rashes, lesions,  ulcers. No induration Neurologic: CN 2-12 grossly intact. Psychiatric: Normal judgment and insight. Alert and oriented x 3. Normal mood.    Labs on Admission: I have personally reviewed following labs and imaging studies  CBC: Recent Labs  Lab 12/31/19 2329  WBC 6.1  NEUTROABS 3.3  HGB 14.2  HCT 42.9  MCV 90.7  PLT 355*   Basic Metabolic Panel: Recent Labs  Lab 12/31/19 2329  NA 141  K 4.2  CL 107  CO2 22  GLUCOSE 118*  BUN 23  CREATININE 1.13  CALCIUM 9.3   GFR: Estimated Creatinine Clearance: 61 mL/min (by C-G formula based on SCr of 1.13 mg/dL).  Radiological Exams on Admission: DG Chest 2 View  Result Date: 12/31/2019 CLINICAL DATA:  Chest pain EXAM: CHEST - 2 VIEW COMPARISON:  None. FINDINGS: Postsurgical changes related to prior CABG including intact and aligned sternotomy wires and multiple surgical clips projecting over the mediastinum. The aorta is calcified. The remaining cardiomediastinal contours are unremarkable. No consolidation, features of edema, pneumothorax, or effusion. No acute osseous or soft tissue abnormality. Degenerative changes are present in the imaged spine and shoulders. IMPRESSION: No acute cardiopulmonary abnormality. Electronically Signed   By: Lovena Le M.D.   On: 12/31/2019 23:51    EKG: Independently reviewed. SR 74. Assessment/Plan Principal Problem:   Chest pain Active Problems:   Coronary artery disease involving native coronary artery of native heart with angina pectoris (HCC)   S/P CABG x 4   Essential hypertension   Type 2 diabetes mellitus with hyperlipidemia (HCC)   Esophageal reflux   Chest pain/ CAD/ S/P CABG -admit in telemetry unit and monitor for any dysrhythmias. -serial cardiac enzymes . -BNP/D-dimer pending. -pt continued on his asa/statin/losartan and metoprolol.  -consider cardiology consult in am and echo is pending,.  -npo except for meds O/N. - IVF NS at kvo of 10 cc/hour. -IV ppi for GERD/refluxz  disease. -CTA chest if d-dimer is positive.  - covid-19 pending.   HTN: -we will continue imdur/losartan/ metoprolol.  DM II: Ssi/a1c/accuchecks.  Home regimen with po meds held.   GERD: Iv ppi/ stool guaiac .  DVT prophylaxis: Heparin  Code Status: Full Family Communication: edna pettigrew -partner/ HCPOA -[phone]. Disposition Plan: Home  Consults called: None Admission status: observation.   Para Skeans MD [facility] If 7PM-7AM, please contact night-coverage [email or web address] Password [email or web address]  01/01/2020, 1:13 AM

## 2021-07-23 IMAGING — CR DG CHEST 2V
2 series · 2 of 2 positions shown · non-contrast
Comparison: None.

CLINICAL DATA: Chest pain

EXAM:
CHEST - 2 VIEW

[chest pa]
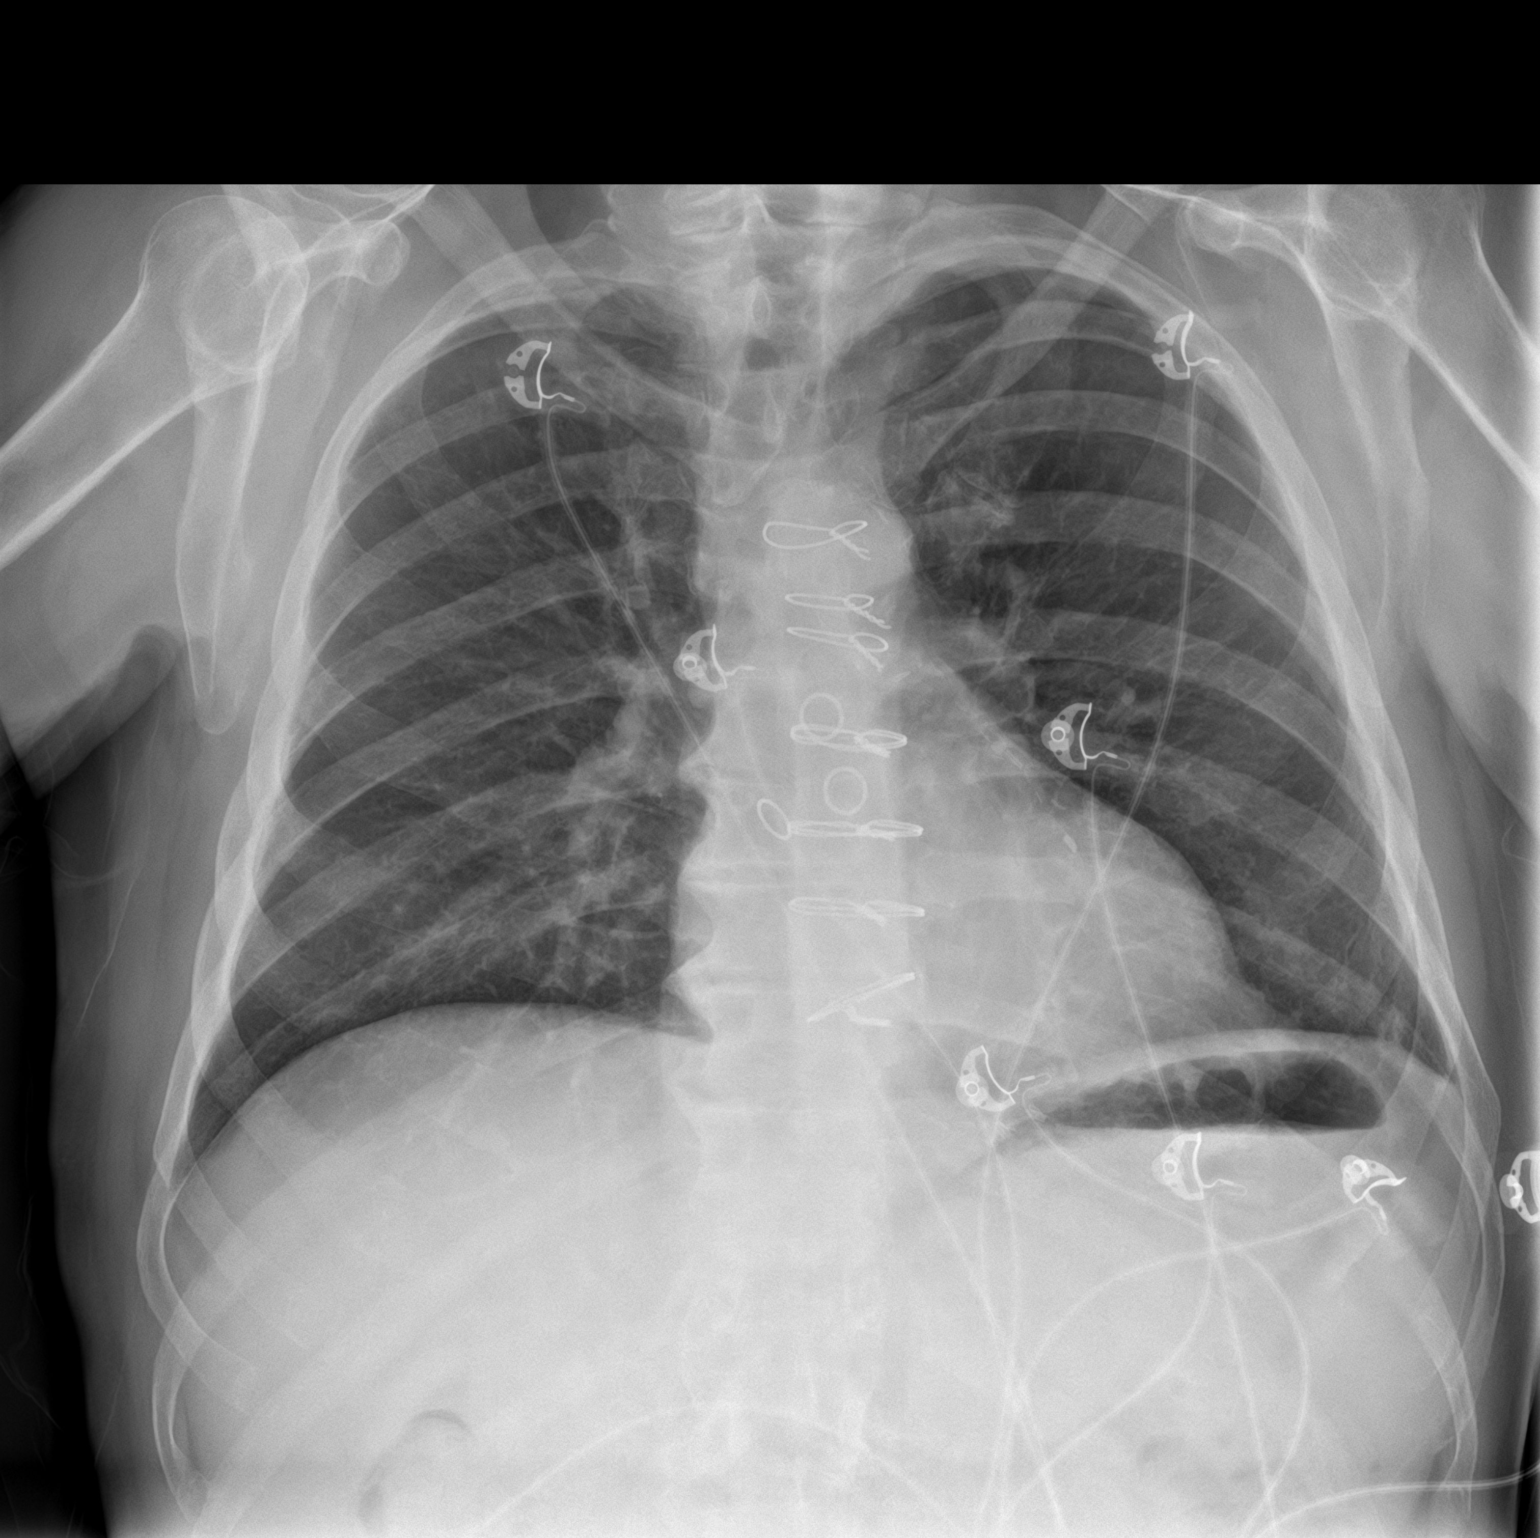

[chest lat]
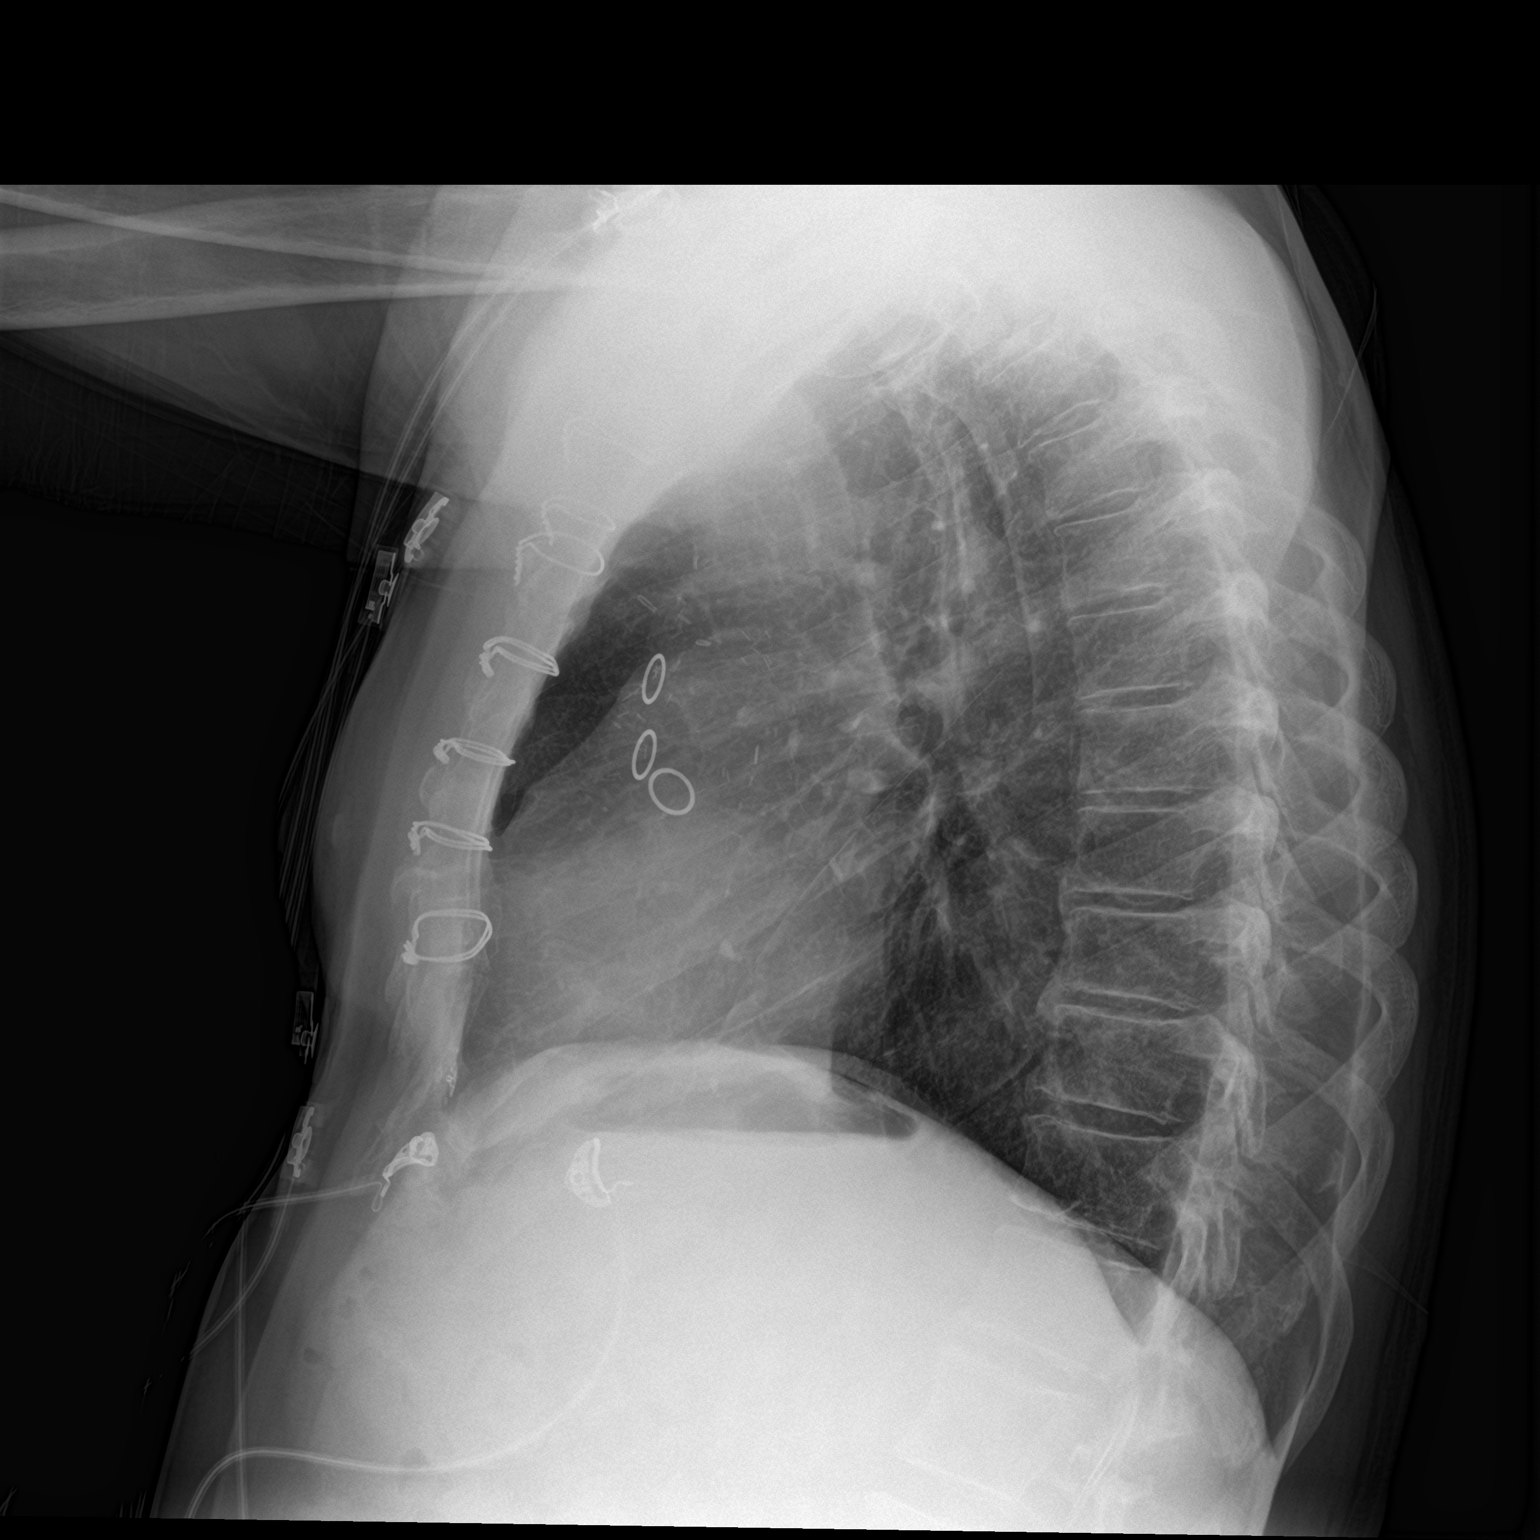

[2 of 2 positions shown; findings below may reference images not displayed]

FINDINGS: Postsurgical changes related to prior CABG including intact and
aligned sternotomy wires and multiple surgical clips projecting over
the mediastinum. The aorta is calcified. The remaining
cardiomediastinal contours are unremarkable. No consolidation,
features of edema, pneumothorax, or effusion. No acute osseous or
soft tissue abnormality. Degenerative changes are present in the
imaged spine and shoulders.
IMPRESSION: No acute cardiopulmonary abnormality.

## 2022-08-25 ENCOUNTER — Other Ambulatory Visit: Payer: Self-pay | Admitting: Internal Medicine

## 2022-08-25 DIAGNOSIS — Z951 Presence of aortocoronary bypass graft: Secondary | ICD-10-CM

## 2022-08-25 DIAGNOSIS — I2089 Other forms of angina pectoris: Secondary | ICD-10-CM

## 2022-08-28 ENCOUNTER — Telehealth (HOSPITAL_COMMUNITY): Payer: Self-pay | Admitting: *Deleted

## 2022-08-28 ENCOUNTER — Encounter (HOSPITAL_COMMUNITY): Payer: Self-pay

## 2022-08-28 NOTE — Telephone Encounter (Signed)
Patient returning call regarding upcoming cardiac imaging study; pt verbalizes understanding of appt date/time, parking situation and where to check in,, and verified current allergies; name and call back number provided for further questions should they arise  Gordy Clement RN Navigator Cardiac Imaging [facility] [phone] office [phone] cell  Patient to take his AM dose of metoprolol and also '50mg'$  metoprolol two hours prior to his cardiac CT scan.  He will hold his Metformin and Glipizide morning of the test.

## 2022-08-28 NOTE — Telephone Encounter (Signed)
Attempted to call patient regarding upcoming cardiac CT appointment. °Left message on voicemail with name and callback number ° °Chamari Cutbirth RN Navigator Cardiac Imaging ° [facility] °[phone] Office °[phone] Cell ° °

## 2022-09-01 ENCOUNTER — Ambulatory Visit
Admission: RE | Admit: 2022-09-01 | Discharge: 2022-09-01 | Disposition: A | Discharge status: Home / Self Care | Payer: Medicare Other | Source: Ambulatory Visit | Attending: Internal Medicine | Admitting: Internal Medicine

## 2022-09-01 DIAGNOSIS — Z951 Presence of aortocoronary bypass graft: Secondary | ICD-10-CM | POA: Insufficient documentation

## 2022-09-01 DIAGNOSIS — I2089 Other forms of angina pectoris: Secondary | ICD-10-CM | POA: Diagnosis present

## 2022-09-01 LAB — POCT I-STAT CREATININE: Creatinine, Ser: 1.3 mg/dL — ABNORMAL HIGH (ref 0.61–1.24)

## 2022-09-01 MED ORDER — METOPROLOL TARTRATE 5 MG/5ML IV SOLN: 10.0000 mg | Freq: Once | INTRAVENOUS | Status: AC

## 2022-09-01 MED ORDER — METOPROLOL TARTRATE 5 MG/5ML IV SOLN
INTRAVENOUS | Status: AC
  Filled 2022-09-01: qty 10

## 2022-09-01 MED ORDER — METOPROLOL TARTRATE 5 MG/5ML IV SOLN
5.0000 mg | Freq: Once | INTRAVENOUS | Status: AC
  Administered 2022-09-01: 10 mg via INTRAVENOUS
  Filled 2022-09-01: qty 5

## 2022-09-01 MED ORDER — IOHEXOL 350 MG/ML SOLN
100.0000 mL | Freq: Once | INTRAVENOUS | Status: AC | PRN
  Administered 2022-09-01: 100 mL via INTRAVENOUS

## 2022-09-01 MED ORDER — NITROGLYCERIN 0.4 MG SL SUBL
0.8000 mg | SUBLINGUAL_TABLET | Freq: Once | SUBLINGUAL | Status: AC
  Administered 2022-09-01: 0.8 mg via SUBLINGUAL
  Filled 2022-09-01: qty 25

## 2022-09-01 MED ORDER — METOPROLOL TARTRATE 5 MG/5ML IV SOLN
INTRAVENOUS | Status: AC
  Administered 2022-09-01: 10 mg via INTRAVENOUS
  Filled 2022-09-01: qty 50

## 2022-09-01 NOTE — Progress Notes (Signed)
Patient tolerated procedure well. Ambulate w/o difficulty. Denies any lightheadedness or being dizzy. Pt denies any pain at this time. Sitting in chair, no concerns voiced.  Pt is encouraged to drink additional water throughout the day and reason explained to patient. Patient verbalized understanding and all questions answered. ABC intact. No further needs at this time. Discharge from procedure area w/o issues.

## 2023-05-15 ENCOUNTER — Other Ambulatory Visit: Payer: Self-pay

## 2023-05-15 ENCOUNTER — Emergency Department
Admission: EM | Admit: 2023-05-15 | Discharge: 2023-05-15 | Disposition: A | Discharge status: Home / Self Care | Payer: Medicare Other | Attending: Emergency Medicine | Admitting: Emergency Medicine

## 2023-05-15 ENCOUNTER — Emergency Department: Payer: Medicare Other

## 2023-05-15 DIAGNOSIS — R079 Chest pain, unspecified: Secondary | ICD-10-CM | POA: Diagnosis present

## 2023-05-15 DIAGNOSIS — I2510 Atherosclerotic heart disease of native coronary artery without angina pectoris: Secondary | ICD-10-CM | POA: Insufficient documentation

## 2023-05-15 DIAGNOSIS — Z955 Presence of coronary angioplasty implant and graft: Secondary | ICD-10-CM | POA: Insufficient documentation

## 2023-05-15 LAB — CBC
HCT: 37.2 % — ABNORMAL LOW (ref 39.0–52.0)
Hemoglobin: 11.9 g/dL — ABNORMAL LOW (ref 13.0–17.0)
MCH: 27.6 pg (ref 26.0–34.0)
MCHC: 32 g/dL (ref 30.0–36.0)
MCV: 86.3 fL (ref 80.0–100.0)
Platelets: 113 10*3/uL — ABNORMAL LOW (ref 150–400)
RBC: 4.31 MIL/uL (ref 4.22–5.81)
RDW: 15.8 % — ABNORMAL HIGH (ref 11.5–15.5)
WBC: 6.6 10*3/uL (ref 4.0–10.5)
nRBC: 0 % (ref 0.0–0.2)

## 2023-05-15 LAB — BASIC METABOLIC PANEL
Anion gap: 11 (ref 5–15)
BUN: 26 mg/dL — ABNORMAL HIGH (ref 8–23)
CO2: 22 mmol/L (ref 22–32)
Calcium: 9 mg/dL (ref 8.9–10.3)
Chloride: 101 mmol/L (ref 98–111)
Creatinine, Ser: 1.49 mg/dL — ABNORMAL HIGH (ref 0.61–1.24)
GFR, Estimated: 49 mL/min — ABNORMAL LOW (ref >60)
Glucose, Bld: 179 mg/dL — ABNORMAL HIGH (ref 70–99)
Potassium: 4.3 mmol/L (ref 3.5–5.1)
Sodium: 134 mmol/L — ABNORMAL LOW (ref 135–145)

## 2023-05-15 LAB — TROPONIN I (HIGH SENSITIVITY)
Troponin I (High Sensitivity): 5 ng/L (ref <18)
Troponin I (High Sensitivity): 5 ng/L (ref <18)

## 2023-05-15 NOTE — ED Provider Notes (Signed)
[facility] Provider Note    Event Date/Time   First MD Initiated Contact with Patient 05/15/23 1727     (approximate)   History   Chest Pain   HPI  Rickey Sherman is a 77 y.o. male who presents to the emergency department today because of concerns for chest pain.  The patient's pain started earlier today when he was playing golf.  Located in the center part of his chest.  Describes it as pressure-like.  It did not radiate.  It was not accompanied by shortness of breath.  The time my exam his pain has improved.  Does have history of coronary artery disease and CABG which was found on his stress test. Says he recently had a cardiac evaluation a few months ago and everything looked fine.      Physical Exam   Triage Vital Signs: ED Triage Vitals  Encounter Vitals Group     BP 05/15/23 1616 119/66     Systolic BP Percentile --      Diastolic BP Percentile --      Pulse Rate 05/15/23 1616 93     Resp 05/15/23 1616 16     Temp 05/15/23 1616 99.4 F (37.4 C)     Temp Source 05/15/23 1616 Oral     SpO2 05/15/23 1616 98 %     Weight 05/15/23 1702 156 lb (70.8 kg)     Height 05/15/23 1617 5\' 10"  (1.778 m)     Head Circumference --      Peak Flow --      Pain Score 05/15/23 1617 0     Pain Loc --      Pain Education --      Exclude from Growth Chart --     Most recent vital signs: Vitals:   05/15/23 1616 05/15/23 1702  BP: 119/66 (!) 132/57  Pulse: 93 84  Resp: 16 17  Temp: 99.4 F (37.4 C)   SpO2: 98% 100%   General: Awake, alert, oriented. CV:  Good peripheral perfusion. Regular rate and rhythm. Resp:  Normal effort. Lungs clear. Abd:  No distention.    ED Results / Procedures / Treatments   Labs (all labs ordered are listed, but only abnormal results are displayed) Labs Reviewed  BASIC METABOLIC PANEL - Abnormal; Notable for the following components:      Result Value   Sodium 134 (*)    Glucose, Bld 179 (*)    BUN 26 (*)     Creatinine, Ser 1.49 (*)    GFR, Estimated 49 (*)    All other components within normal limits  CBC - Abnormal; Notable for the following components:   Hemoglobin 11.9 (*)    HCT 37.2 (*)    RDW 15.8 (*)    Platelets 113 (*)    All other components within normal limits  TROPONIN I (HIGH SENSITIVITY)     EKG  I, Phineas Semen, attending physician, personally viewed and interpreted this EKG  EKG Time: 1616 Rate: 91 Rhythm: normal sinus rhythm Axis: normal Intervals: qtc 428 QRS: narrow ST changes: no st elevation Impression: normal ekg   RADIOLOGY I independently interpreted and visualized the CXR. My interpretation: No pneumonia Radiology interpretation:  IMPRESSION:  No active cardiopulmonary disease.      PROCEDURES:  Critical Care performed: No    MEDICATIONS ORDERED IN ED: Medications - No data to display   IMPRESSION / MDM / ASSESSMENT AND PLAN / ED COURSE  I reviewed the triage vital signs and the nursing notes.                              Differential diagnosis includes, but is not limited to, ACS, pneumonia, dissection, PE, esophagitis  Patient's presentation is most consistent with acute presentation with potential threat to life or bodily function.   The patient is on the cardiac monitor to evaluate for evidence of arrhythmia and/or significant heart rate changes.  Patient presented to the emergency department today because of concerns for chest pain.  EKG without concerning ST elevation.  Chest x-ray without pneumonia or pneumothorax.  Initial troponin negative.  Given history of cardiac disease will check second troponin.  Second troponin negative. Patient stated he continued to feel improved. At this time given reassuring blood work and EKG have low suspicion for ACS. Have low suspicion for PE or dissection at this time given clinical improvement. Will plan on discharging. Encouraged patient to follow up with cardiologist.       FINAL  CLINICAL IMPRESSION(S) / ED DIAGNOSES   Final diagnoses:  Nonspecific chest pain      Note:  This document was prepared using Dragon voice recognition software and may include unintentional dictation errors.    Phineas Semen, MD 05/15/23 Mikle Bosworth

## 2023-05-15 NOTE — ED Notes (Signed)
Discharge instructions provided by edp were discussed with pt. Pt verbalized understanding with no additional questions at this time. Pt verbalized follow up with cardiology. Pt ambulatory at discharged called family for ride home

## 2023-05-15 NOTE — ED Triage Notes (Signed)
Pt to ed from home via [facility] for CP that started around 930 this morning while he was playing golf. Pt completed his round of golf.   EKG for EMS WNL BGL 259 18G RAC 1 SL NTG 4mg  of IV zofran 92HR 97% 100/63  Pt is caox4, in no acute distress and in a wheel chair in triage place by EMS>

## 2023-05-15 NOTE — Discharge Instructions (Signed)
Please seek medical attention for any high fevers, chest pain, shortness of breath, change in behavior, persistent vomiting, bloody stool or any other new or concerning symptoms.
